# Patient Record
Sex: Male | Born: 1950 | Race: Black or African American | Hispanic: No | Marital: Married | Smoking: Current every day smoker
Health system: Southern US, Community
[De-identification: ages and names within clinical notes are randomized; demographics above are authoritative.]

## PROBLEM LIST (undated history)

## (undated) ENCOUNTER — Ambulatory Visit (HOSPITAL_COMMUNITY): Admission: EM | Payer: Medicare HMO

## (undated) ENCOUNTER — Emergency Department (HOSPITAL_COMMUNITY): Payer: Medicare HMO

## (undated) DIAGNOSIS — F419 Anxiety disorder, unspecified: Secondary | ICD-10-CM

## (undated) DIAGNOSIS — IMO0001 Reserved for inherently not codable concepts without codable children: Secondary | ICD-10-CM

## (undated) DIAGNOSIS — F32A Depression, unspecified: Secondary | ICD-10-CM

## (undated) DIAGNOSIS — K219 Gastro-esophageal reflux disease without esophagitis: Secondary | ICD-10-CM

## (undated) DIAGNOSIS — F1110 Opioid abuse, uncomplicated: Secondary | ICD-10-CM

## (undated) DIAGNOSIS — I878 Other specified disorders of veins: Secondary | ICD-10-CM

## (undated) DIAGNOSIS — B182 Chronic viral hepatitis C: Secondary | ICD-10-CM

## (undated) DIAGNOSIS — F1010 Alcohol abuse, uncomplicated: Secondary | ICD-10-CM

## (undated) DIAGNOSIS — I10 Essential (primary) hypertension: Secondary | ICD-10-CM

## (undated) DIAGNOSIS — N529 Male erectile dysfunction, unspecified: Secondary | ICD-10-CM

## (undated) DIAGNOSIS — F329 Major depressive disorder, single episode, unspecified: Secondary | ICD-10-CM

## (undated) HISTORY — PX: TONSILLECTOMY: SUR1361

## (undated) HISTORY — PX: CHOLECYSTECTOMY: SHX55

## (undated) HISTORY — PX: ORBITAL FRACTURE SURGERY: SHX725

## (undated) HISTORY — DX: Male erectile dysfunction, unspecified: N52.9

## (undated) HISTORY — DX: Depression, unspecified: F32.A

## (undated) HISTORY — DX: Major depressive disorder, single episode, unspecified: F32.9

## (undated) HISTORY — DX: Anxiety disorder, unspecified: F41.9

## (undated) HISTORY — DX: Essential (primary) hypertension: I10

---

## 1999-07-02 ENCOUNTER — Other Ambulatory Visit: Admission: RE | Admit: 1999-07-02 | Discharge: 1999-07-02 | Payer: Self-pay | Admitting: Urology

## 1999-08-05 ENCOUNTER — Ambulatory Visit (HOSPITAL_COMMUNITY): Admission: RE | Admit: 1999-08-05 | Discharge: 1999-08-05 | Payer: Self-pay | Admitting: Gastroenterology

## 1999-08-05 ENCOUNTER — Encounter (INDEPENDENT_AMBULATORY_CARE_PROVIDER_SITE_OTHER): Payer: Self-pay | Admitting: Specialist

## 2000-01-01 ENCOUNTER — Inpatient Hospital Stay (HOSPITAL_COMMUNITY): Admission: EM | Admit: 2000-01-01 | Discharge: 2000-01-07 | Payer: Self-pay | Admitting: Family Medicine

## 2000-01-02 ENCOUNTER — Encounter: Payer: Self-pay | Admitting: Family Medicine

## 2001-07-22 ENCOUNTER — Encounter: Payer: Self-pay | Admitting: Internal Medicine

## 2001-07-22 ENCOUNTER — Encounter: Admission: RE | Admit: 2001-07-22 | Discharge: 2001-07-22 | Payer: Self-pay | Admitting: Internal Medicine

## 2001-07-30 ENCOUNTER — Ambulatory Visit (HOSPITAL_COMMUNITY): Admission: RE | Admit: 2001-07-30 | Discharge: 2001-07-30 | Payer: Self-pay | Admitting: Gastroenterology

## 2002-04-02 ENCOUNTER — Ambulatory Visit (HOSPITAL_COMMUNITY): Admission: RE | Admit: 2002-04-02 | Discharge: 2002-04-02 | Payer: Self-pay | Admitting: Internal Medicine

## 2002-04-02 ENCOUNTER — Encounter: Payer: Self-pay | Admitting: Internal Medicine

## 2002-07-01 ENCOUNTER — Emergency Department (HOSPITAL_COMMUNITY): Admission: EM | Admit: 2002-07-01 | Discharge: 2002-07-01 | Payer: Self-pay | Admitting: Emergency Medicine

## 2002-07-01 ENCOUNTER — Encounter: Payer: Self-pay | Admitting: Emergency Medicine

## 2002-10-19 ENCOUNTER — Emergency Department (HOSPITAL_COMMUNITY): Admission: EM | Admit: 2002-10-19 | Discharge: 2002-10-19 | Payer: Self-pay | Admitting: Emergency Medicine

## 2002-10-20 ENCOUNTER — Encounter: Payer: Self-pay | Admitting: Emergency Medicine

## 2002-10-20 ENCOUNTER — Emergency Department (HOSPITAL_COMMUNITY): Admission: EM | Admit: 2002-10-20 | Discharge: 2002-10-20 | Payer: Self-pay | Admitting: Emergency Medicine

## 2002-11-24 ENCOUNTER — Emergency Department (HOSPITAL_COMMUNITY): Admission: EM | Admit: 2002-11-24 | Discharge: 2002-11-25 | Payer: Self-pay | Admitting: Emergency Medicine

## 2002-12-31 ENCOUNTER — Encounter: Payer: Self-pay | Admitting: Emergency Medicine

## 2002-12-31 ENCOUNTER — Inpatient Hospital Stay (HOSPITAL_COMMUNITY): Admission: EM | Admit: 2002-12-31 | Discharge: 2003-01-02 | Payer: Self-pay | Admitting: Emergency Medicine

## 2003-06-21 ENCOUNTER — Inpatient Hospital Stay (HOSPITAL_COMMUNITY): Admission: AD | Admit: 2003-06-21 | Discharge: 2003-06-23 | Payer: Self-pay | Admitting: *Deleted

## 2003-07-15 ENCOUNTER — Emergency Department (HOSPITAL_COMMUNITY): Admission: EM | Admit: 2003-07-15 | Discharge: 2003-07-16 | Payer: Self-pay | Admitting: Emergency Medicine

## 2004-07-21 ENCOUNTER — Ambulatory Visit: Payer: Self-pay | Admitting: Internal Medicine

## 2004-07-23 ENCOUNTER — Ambulatory Visit: Payer: Self-pay | Admitting: Internal Medicine

## 2004-07-30 ENCOUNTER — Ambulatory Visit: Payer: Self-pay | Admitting: Internal Medicine

## 2004-09-08 ENCOUNTER — Ambulatory Visit (HOSPITAL_COMMUNITY): Admission: RE | Admit: 2004-09-08 | Discharge: 2004-09-08 | Payer: Self-pay | Admitting: General Surgery

## 2004-10-29 ENCOUNTER — Encounter (INDEPENDENT_AMBULATORY_CARE_PROVIDER_SITE_OTHER): Payer: Self-pay | Admitting: Specialist

## 2004-10-29 ENCOUNTER — Observation Stay (HOSPITAL_COMMUNITY): Admission: RE | Admit: 2004-10-29 | Discharge: 2004-10-30 | Payer: Self-pay | Admitting: General Surgery

## 2004-12-09 ENCOUNTER — Emergency Department (HOSPITAL_COMMUNITY): Admission: EM | Admit: 2004-12-09 | Discharge: 2004-12-09 | Payer: Self-pay | Admitting: Family Medicine

## 2005-05-09 ENCOUNTER — Ambulatory Visit: Payer: Self-pay | Admitting: Internal Medicine

## 2005-12-16 ENCOUNTER — Ambulatory Visit: Payer: Self-pay | Admitting: Internal Medicine

## 2006-04-07 ENCOUNTER — Emergency Department (HOSPITAL_COMMUNITY): Admission: EM | Admit: 2006-04-07 | Discharge: 2006-04-08 | Payer: Self-pay | Admitting: Emergency Medicine

## 2006-04-11 ENCOUNTER — Ambulatory Visit: Payer: Self-pay | Admitting: Internal Medicine

## 2006-04-14 ENCOUNTER — Ambulatory Visit: Payer: Self-pay | Admitting: Internal Medicine

## 2006-05-26 ENCOUNTER — Ambulatory Visit: Payer: Self-pay | Admitting: Internal Medicine

## 2006-09-25 ENCOUNTER — Ambulatory Visit: Payer: Self-pay | Admitting: Internal Medicine

## 2006-11-10 ENCOUNTER — Ambulatory Visit: Payer: Self-pay | Admitting: Internal Medicine

## 2006-12-25 ENCOUNTER — Ambulatory Visit: Payer: Self-pay | Admitting: Internal Medicine

## 2007-02-21 ENCOUNTER — Encounter: Admission: RE | Admit: 2007-02-21 | Discharge: 2007-02-21 | Payer: Self-pay | Admitting: Family Medicine

## 2007-04-25 ENCOUNTER — Emergency Department (HOSPITAL_COMMUNITY): Admission: EM | Admit: 2007-04-25 | Discharge: 2007-04-25 | Payer: Self-pay | Admitting: Emergency Medicine

## 2007-05-03 ENCOUNTER — Telehealth (INDEPENDENT_AMBULATORY_CARE_PROVIDER_SITE_OTHER): Payer: Self-pay | Admitting: *Deleted

## 2007-05-15 ENCOUNTER — Telehealth: Payer: Self-pay | Admitting: Internal Medicine

## 2007-05-16 ENCOUNTER — Telehealth (INDEPENDENT_AMBULATORY_CARE_PROVIDER_SITE_OTHER): Payer: Self-pay | Admitting: *Deleted

## 2007-05-17 ENCOUNTER — Telehealth (INDEPENDENT_AMBULATORY_CARE_PROVIDER_SITE_OTHER): Payer: Self-pay | Admitting: *Deleted

## 2007-05-17 ENCOUNTER — Telehealth: Payer: Self-pay | Admitting: Internal Medicine

## 2007-05-23 ENCOUNTER — Ambulatory Visit: Payer: Self-pay | Admitting: Internal Medicine

## 2007-05-23 DIAGNOSIS — K219 Gastro-esophageal reflux disease without esophagitis: Secondary | ICD-10-CM

## 2007-05-23 DIAGNOSIS — F1120 Opioid dependence, uncomplicated: Secondary | ICD-10-CM | POA: Insufficient documentation

## 2007-05-23 DIAGNOSIS — A6000 Herpesviral infection of urogenital system, unspecified: Secondary | ICD-10-CM | POA: Insufficient documentation

## 2007-05-23 DIAGNOSIS — B182 Chronic viral hepatitis C: Secondary | ICD-10-CM | POA: Insufficient documentation

## 2007-05-23 DIAGNOSIS — M545 Low back pain: Secondary | ICD-10-CM

## 2007-05-23 DIAGNOSIS — F411 Generalized anxiety disorder: Secondary | ICD-10-CM | POA: Insufficient documentation

## 2007-05-23 LAB — CONVERTED CEMR LAB: Blood Glucose, Fingerstick: 83

## 2007-06-18 ENCOUNTER — Telehealth: Payer: Self-pay | Admitting: Internal Medicine

## 2007-06-22 ENCOUNTER — Encounter: Payer: Self-pay | Admitting: Internal Medicine

## 2007-07-11 ENCOUNTER — Encounter: Payer: Self-pay | Admitting: Internal Medicine

## 2007-10-11 ENCOUNTER — Encounter: Payer: Self-pay | Admitting: Internal Medicine

## 2007-10-15 ENCOUNTER — Telehealth: Payer: Self-pay | Admitting: Internal Medicine

## 2007-10-18 ENCOUNTER — Emergency Department (HOSPITAL_COMMUNITY): Admission: EM | Admit: 2007-10-18 | Discharge: 2007-10-18 | Payer: Self-pay | Admitting: Family Medicine

## 2007-11-02 ENCOUNTER — Ambulatory Visit: Payer: Self-pay | Admitting: Internal Medicine

## 2007-11-02 DIAGNOSIS — N529 Male erectile dysfunction, unspecified: Secondary | ICD-10-CM | POA: Insufficient documentation

## 2007-11-02 DIAGNOSIS — J309 Allergic rhinitis, unspecified: Secondary | ICD-10-CM | POA: Insufficient documentation

## 2007-11-13 ENCOUNTER — Encounter (INDEPENDENT_AMBULATORY_CARE_PROVIDER_SITE_OTHER): Payer: Self-pay | Admitting: Family Medicine

## 2007-11-13 ENCOUNTER — Ambulatory Visit: Payer: Self-pay | Admitting: Internal Medicine

## 2007-11-13 LAB — CONVERTED CEMR LAB
CO2: 23 meq/L (ref 19–32)
Calcium: 9.5 mg/dL (ref 8.4–10.5)
Chloride: 105 meq/L (ref 96–112)
Cholesterol: 114 mg/dL (ref 0–200)
Creatinine, Ser: 0.71 mg/dL (ref 0.40–1.50)
Eosinophils Relative: 1 % (ref 0–5)
Glucose, Bld: 89 mg/dL (ref 70–99)
HCT: 46.3 % (ref 39.0–52.0)
Hemoglobin: 15.7 g/dL (ref 13.0–17.0)
Lymphocytes Relative: 66 % — ABNORMAL HIGH (ref 12–46)
Lymphs Abs: 3.2 10*3/uL (ref 0.7–4.0)
Monocytes Absolute: 0.4 10*3/uL (ref 0.1–1.0)
Neutro Abs: 1.2 10*3/uL — ABNORMAL LOW (ref 1.7–7.7)
RBC: 5.58 M/uL (ref 4.22–5.81)
Total Bilirubin: 1.3 mg/dL — ABNORMAL HIGH (ref 0.3–1.2)
Total Protein: 7.6 g/dL (ref 6.0–8.3)
Triglycerides: 104 mg/dL (ref <150)
VLDL: 21 mg/dL (ref 0–40)
WBC: 4.9 10*3/uL (ref 4.0–10.5)

## 2007-12-17 ENCOUNTER — Ambulatory Visit: Payer: Self-pay | Admitting: *Deleted

## 2007-12-18 ENCOUNTER — Ambulatory Visit: Payer: Self-pay | Admitting: Internal Medicine

## 2007-12-19 ENCOUNTER — Ambulatory Visit: Payer: Self-pay | Admitting: Internal Medicine

## 2007-12-19 LAB — CONVERTED CEMR LAB
Cholesterol: 108 mg/dL (ref 0–200)
LDL Cholesterol: 55 mg/dL (ref 0–99)
Total CHOL/HDL Ratio: 2.9
Triglycerides: 81 mg/dL (ref <150)
VLDL: 16 mg/dL (ref 0–40)

## 2007-12-27 ENCOUNTER — Ambulatory Visit: Payer: Self-pay | Admitting: Internal Medicine

## 2008-04-08 ENCOUNTER — Emergency Department (HOSPITAL_COMMUNITY): Admission: EM | Admit: 2008-04-08 | Discharge: 2008-04-08 | Payer: Self-pay | Admitting: Emergency Medicine

## 2008-04-23 ENCOUNTER — Ambulatory Visit: Payer: Self-pay | Admitting: Internal Medicine

## 2008-06-23 ENCOUNTER — Emergency Department (HOSPITAL_COMMUNITY): Admission: EM | Admit: 2008-06-23 | Discharge: 2008-06-23 | Payer: Self-pay | Admitting: Family Medicine

## 2008-07-23 ENCOUNTER — Ambulatory Visit: Payer: Self-pay | Admitting: Internal Medicine

## 2008-09-11 ENCOUNTER — Ambulatory Visit: Payer: Self-pay | Admitting: Internal Medicine

## 2008-09-11 LAB — CONVERTED CEMR LAB
AST: 32 units/L (ref 0–37)
Albumin: 4.3 g/dL (ref 3.5–5.2)
BUN: 20 mg/dL (ref 6–23)
CO2: 25 meq/L (ref 19–32)
Calcium: 9.2 mg/dL (ref 8.4–10.5)
Chloride: 106 meq/L (ref 96–112)
Cholesterol: 111 mg/dL (ref 0–200)
Creatinine, Ser: 0.76 mg/dL (ref 0.40–1.50)
Glucose, Bld: 115 mg/dL — ABNORMAL HIGH (ref 70–99)
HDL: 36 mg/dL — ABNORMAL LOW (ref >39)
PSA: 0.72 ng/mL (ref 0.10–4.00)
Potassium: 4.3 meq/L (ref 3.5–5.3)
Total CHOL/HDL Ratio: 3.1
Triglycerides: 98 mg/dL (ref <150)

## 2008-09-16 ENCOUNTER — Ambulatory Visit: Payer: Self-pay | Admitting: Internal Medicine

## 2008-10-21 ENCOUNTER — Ambulatory Visit: Payer: Self-pay | Admitting: Internal Medicine

## 2008-12-07 ENCOUNTER — Emergency Department (HOSPITAL_COMMUNITY): Admission: EM | Admit: 2008-12-07 | Discharge: 2008-12-07 | Payer: Self-pay | Admitting: Family Medicine

## 2008-12-21 ENCOUNTER — Emergency Department (HOSPITAL_COMMUNITY): Admission: EM | Admit: 2008-12-21 | Discharge: 2008-12-21 | Payer: Self-pay | Admitting: Family Medicine

## 2009-01-19 ENCOUNTER — Emergency Department (HOSPITAL_COMMUNITY): Admission: EM | Admit: 2009-01-19 | Discharge: 2009-01-19 | Payer: Self-pay | Admitting: Emergency Medicine

## 2009-03-26 ENCOUNTER — Ambulatory Visit: Payer: Self-pay | Admitting: Internal Medicine

## 2009-04-03 ENCOUNTER — Ambulatory Visit: Payer: Self-pay | Admitting: Internal Medicine

## 2009-04-03 LAB — CONVERTED CEMR LAB
ALT: 65 units/L — ABNORMAL HIGH (ref 0–53)
BUN: 16 mg/dL (ref 6–23)
CO2: 24 meq/L (ref 19–32)
Calcium: 8.9 mg/dL (ref 8.4–10.5)
Chloride: 103 meq/L (ref 96–112)
Creatinine, Ser: 0.66 mg/dL (ref 0.40–1.50)
Glucose, Bld: 101 mg/dL — ABNORMAL HIGH (ref 70–99)
Total Bilirubin: 1.4 mg/dL — ABNORMAL HIGH (ref 0.3–1.2)

## 2009-04-16 ENCOUNTER — Ambulatory Visit: Payer: Self-pay | Admitting: Gastroenterology

## 2009-05-04 ENCOUNTER — Ambulatory Visit (HOSPITAL_COMMUNITY): Admission: RE | Admit: 2009-05-04 | Discharge: 2009-05-04 | Payer: Self-pay | Admitting: Gastroenterology

## 2009-05-22 ENCOUNTER — Ambulatory Visit (HOSPITAL_COMMUNITY): Admission: RE | Admit: 2009-05-22 | Discharge: 2009-05-22 | Payer: Self-pay | Admitting: Gastroenterology

## 2009-06-17 ENCOUNTER — Ambulatory Visit: Payer: Self-pay | Admitting: Internal Medicine

## 2009-06-18 ENCOUNTER — Ambulatory Visit: Payer: Self-pay | Admitting: Gastroenterology

## 2009-06-18 ENCOUNTER — Encounter: Payer: Self-pay | Admitting: Gastroenterology

## 2009-06-30 ENCOUNTER — Ambulatory Visit (HOSPITAL_COMMUNITY): Admission: RE | Admit: 2009-06-30 | Discharge: 2009-06-30 | Payer: Self-pay | Admitting: Gastroenterology

## 2009-07-23 ENCOUNTER — Ambulatory Visit: Payer: Self-pay | Admitting: Gastroenterology

## 2009-10-11 ENCOUNTER — Observation Stay (HOSPITAL_COMMUNITY): Admission: EM | Admit: 2009-10-11 | Discharge: 2009-10-11 | Payer: Self-pay | Admitting: Emergency Medicine

## 2009-10-13 ENCOUNTER — Ambulatory Visit: Payer: Self-pay | Admitting: Internal Medicine

## 2009-12-31 ENCOUNTER — Ambulatory Visit: Payer: Self-pay | Admitting: Gastroenterology

## 2010-04-30 ENCOUNTER — Emergency Department (HOSPITAL_COMMUNITY)
Admission: EM | Admit: 2010-04-30 | Discharge: 2010-04-30 | Payer: Self-pay | Source: Home / Self Care | Admitting: Family Medicine

## 2010-07-13 NOTE — Letter (Signed)
Summary: [facility]  [facility]   Imported By: Lester [place] 07/31/2009 08:42:34  _____________________________________________________________________  External Attachment:    Type:   Image     Comment:   External Document

## 2010-08-31 LAB — BASIC METABOLIC PANEL
BUN: 14 mg/dL (ref 6–23)
Calcium: 8.6 mg/dL (ref 8.4–10.5)
Chloride: 100 mEq/L (ref 96–112)
Creatinine, Ser: 0.6 mg/dL (ref 0.4–1.5)
GFR calc Af Amer: >60 mL/min (ref >60)

## 2010-08-31 LAB — DIFFERENTIAL
Basophils Absolute: 0 10*3/uL (ref 0.0–0.1)
Basophils Relative: 1 % (ref 0–1)
Eosinophils Absolute: 0.1 10*3/uL (ref 0.0–0.7)
Eosinophils Relative: 2 % (ref 0–5)
Monocytes Absolute: 0.6 10*3/uL (ref 0.1–1.0)

## 2010-08-31 LAB — POCT CARDIAC MARKERS
Myoglobin, poc: 63.2 ng/mL (ref 12–200)
Myoglobin, poc: 77.6 ng/mL (ref 12–200)
Troponin i, poc: <0.05 ng/mL (ref 0.00–0.09)
Troponin i, poc: <0.05 ng/mL (ref 0.00–0.09)

## 2010-08-31 LAB — CBC
HCT: 44.3 % (ref 39.0–52.0)
Hemoglobin: 15.2 g/dL (ref 13.0–17.0)
MCHC: 34.3 g/dL (ref 30.0–36.0)
RDW: 13.7 % (ref 11.5–15.5)

## 2010-09-14 LAB — CBC
MCV: 85.9 fL (ref 78.0–100.0)
Platelets: 160 10*3/uL (ref 150–400)
WBC: 5.1 10*3/uL (ref 4.0–10.5)

## 2010-09-14 LAB — PROTIME-INR: Prothrombin Time: 13.8 seconds (ref 11.6–15.2)

## 2010-09-14 LAB — APTT: aPTT: 30 seconds (ref 24–37)

## 2010-09-20 LAB — GLUCOSE, CAPILLARY: Glucose-Capillary: 100 mg/dL — ABNORMAL HIGH (ref 70–99)

## 2010-10-29 NOTE — H&P (Signed)
[facility]  Patient:    Bryan Powell, Bryan Powell                   MRN: [id number] Adm. Date:  [id number] Attending:  Drema Halon CC:         Ronnald Nian, M.D.                         History and Physical  HISTORY OF PRESENT ILLNESS:  This is a 60 year old black male with a 36 hour history of fever and chills with right leg swelling, for which he was seen one day prior by Dr. Sharlot Gowda who found redness in right lower leg/ankle area and placed him on cephalosporin antibiotic.  Over the next 24 hours he had fever and chills, decreased appetite and decreased liquids and was seen in the emergency room with his right lower leg swollen and red with a streak coming up to the right medial thigh with tenderness, though his fever had gone down when seen.  He was admitted for IV antibiotics.  Complicating factor was the fact that we were unable to get an IV or blood drawn because of past IV drug use as below.  A radiologist was called to put a PIC line in for blood drawing and IV fluids and antibiotic administration.  ALLERGIES:  No known drug allergies.  MEDICATIONS: 1. Prilosec 20 mg a day for esophagitis through Dr. Arlyce Dice. 2. 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  FAMILY HISTORY:  Father died at 21 of old age.  Mother is in a nursing facility with dementia.  He has a brother and a sister who are both alive and well.  SOCIAL HISTORY:  No tobacco or alcohol.  He works in Field seismologist.  The patient had a tetanus two years prior and reportedly a negative HIV two years prior.  He had IV drug use from the age of 54 to 60.  HOSPITALIZATIONS AND OPERATIONS: 1. Orbit fracture repair. 2. Probable hepatitis B at age 46.  REVIEW OF SYSTEMS:  Really nothing substantial except he is known to have hepatitis C proven by biopsy by Dr. Arlyce Dice two years ago with no therapy.  He has no other ongoing disease other than that listed above and a history of  hay fever.  PHYSICAL EXAMINATION ON ADMISSION:  Temperature 98.5, pulse 88, respirations 16, blood pressure 100/60 without drop.  SKIN:  Cool and dry with good turgor.  There is right ankle to inferior to the right knee redness and swelling of the calf which extends below prior mark lines, apparently by Dr. Susann Givens one day before.  In the medial calf there is an 8 inch red 1 cm wide strip, extending towards his groin with tenderness in the groin; although, I cannot palpate nodes.  HEENT:  Tongue and mouth are negative.  Nose is clear.  CARDIORESPIRATORY:  No murmurs are heard.  Lungs are clear.  ABDOMEN:  Soft without organomegaly or masses.  Bowel sounds were normal. There were no bruits heard.  GU:  Normal male, descended testes.  NEUROMUSCULOSKELETAL:  Painful movement of the leg without focal neurologic abnormality.  IMPRESSION ON ADMISSION: 1. Cellulitis right lower leg, extending into the right thigh, doubt sepsis. 2. 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  PLAN:  See orders.  The radiologist placed the line, has drawn the blood and we have taken cafe of the orders. DD:  01/02/00 TD:  01/03/00 Job: [id number]

## 2010-10-29 NOTE — Discharge Summary (Signed)
[facility]  Patient:    Bryan Powell, Bryan Powell                   MRN: [id number] Adm. Date:  [id number] Disc. Date: [id number] Attending:  Drema Halon                           Discharge Summary  ADMITTING DIAGNOSES:  Cellulitis of the leg with a previous history of hepatitis C and hepatitis B, also previous history of esophagitis.  HISTORY:  This is a 60 year old black male who was admitted to the hospital after a 36-hour history of fever, chills, and right leg swelling.  He was seen in my office the day prior to admission and started on antibiotics for presumed cellulitis; however, he got much worse over the ensuing 24 hours and came to the emergency room for further evaluation.  Because the leg swelling was much worse, it was decided to admit him for IV antibiotics.  PAST MEDICAL HISTORY:  Previous history significant as above and also he has a history of IV drug abuse which made it difficult to get IV access.  PHYSICAL EXAMINATION:  On admission.  GENERAL:  Black male in no acute distress.  VITAL SIGNS:  Temperature was 98.5, pulse 88, respirations 16.  SKIN:  Cool and dry.  EXTREMITIES:  The right lower extremity did show redness and erythema, especially in the calf area with some tenderness to palpation.  Otherwise his exam was essentially unremarkable.  HOSPITAL COURSE:  He was admitted through the emergency room.  IV access was difficult, and therefore a PIC line had to be placed.  Dr. Lebron Conners was consulted for surgical evaluation.  He was initially started on IV Rocephin and appropriate lab results were obtained including blood cultures, CBC, and a CMET.  He also was studied with a venous evaluation for DVT and SVT, and all were negative.  He responded slowly to the IV antibiotics and was eventually switched to Ceftin.  There were some abnormal liver enzymes and with his previous history of hepatitis B and C, this will be  followed up as an outpatient.  LABORATORY DATA:  Initial white blood count was 12.4 with repeat one day later of 7.9.  His glucose on admission was 134, albumin 3.1, ALT 42, ALP 32, total bilirubin 1.6.  HIV testing was negative.  Blood cultures showed no growth after approximately five days.  Venous evaluation, as reported earlier, was negative.  CONDITION ON DISCHARGE:  Improved.  DISCHARGE MEDICATIONS:  Previous proton pump inhibitor as well as Ceftin.  FOLLOW-UP:  He is to be followed up in my office in approximately one week. DD:  03/06/00 TD:  03/06/00 Job: [id number]

## 2010-10-29 NOTE — H&P (Signed)
NAME:  Bryan Powell, LAX                   ACCOUNT NO.:  [id number]   MEDICAL RECORD NO.:  [id number]                   PATIENT TYPE:  INP   LOCATION:  0101                                 FACILITY:  [facility]   PHYSICIAN:  Mark C. Ophelia Charter, M.D.                 DATE OF BIRTH:  April 26, 1951   DATE OF ADMISSION:  12/31/2002  DATE OF DISCHARGE:                                HISTORY & PHYSICAL   DIAGNOSES:  Intravenous heroin user with fall down stairs and left proximal  humerus fracture dislocation.   HISTORY:  A 60 year old male 60-month history of heroin use after going 12  years with abstinence fell down the stairs.  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   The patient has recently been seen by Georgina Quint. Plotnikov, M.D. [facility].  Has  been on Keflex for cellulitis secondary to heroin IV multiple injections.  He has had cellulitis in his legs as well as his left forearm recently.   ALLERGIES:  NKDA.   PAST MEDICAL HISTORY:  Hepatitis C.   SOCIAL HISTORY:  He works as a Medical illustrator.  Lives with his wife.   MEDICATIONS:  He is on some medications for sinuses.  Also takes a fluid  pill and some Prilosec.  All dosages are unknown and wife will obtain  appropriate dosages of his medications.   PHYSICAL EXAMINATION:  VITAL SIGNS:  Temperature 98, pulse 102, respirations  20, blood pressure 127/70.  GENERAL:  The patient is somnolent due to recent IV heroin  injection/benzodiazepines, etc.  NECK:  Supple.  LUNGS:  He has decreased breath sounds at the bases.  EXTREMITIES:  Examination of arms demonstrates diffuse edema, subcutaneous  fullness without firm compartments both upper and lower extremities.  He has  venous stasis changes, multiple injection sites in his legs and arms with  needle track.  ABDOMEN:  Soft, nontender.  SKIN:  No neck injection  sites are visualized.  NEUROLOGIC:  He has some decreased sensation over the deltoid skin region of  axillary nerve innervation.  Grip strength, radial nerve function is normal.   LABORATORIES:  Chest x-ray shows early atelectasis versus infiltrate, worse  on left than right.  Left shoulder shows fracture of the humeral neck just  above apparent benign lesion of the metaphysis diaphysis with an anterior  shoulder dislocation.   PLAN:  Admit.  To OR for reduction, pinning versus external fixation versus  open reduction as needed.  Procedure is discussed with patient who is fairly  awake.  Wife was present and she demonstrated good understanding of the  problem.  The patient had previous history of heroin use but was in prison  and stopped IV narcotic usage and per wife's history he was clean for 12  years until he restarted seven months ago.  Medical consultation will be  obtained  with Georgina Quint. Plotnikov, M.D. [facility] group.  He will be at increased risk for  repeat dislocation and penetration of the fracture through the skin if he  develops narcotic withdrawal seizures.  After two to three weeks of healing  he should be able to be weaned off his narcotics and placed in a drug  treatment program.                                               Loraine Leriche C. Ophelia Charter, M.D.    MCY/MEDQ  D:  12/31/2002  T:  12/31/2002  Job:  [id number]   cc:   Georgina Quint. Plotnikov, M.D. [facility]    cc:   Georgina Quint. Plotnikov, M.D. [facility]

## 2010-10-29 NOTE — Op Note (Signed)
NAME:  Bryan Powell, SCHAUF NO.:  [id number]   MEDICAL RECORD NO.:  [id number]          PATIENT TYPE:  AMB   LOCATION:  DAY                          FACILITY:  [facility]   PHYSICIAN:  Leonie Man, M.D.   DATE OF BIRTH:  June 03, 1951   DATE OF PROCEDURE:  10/29/2004  DATE OF DISCHARGE:                                 OPERATIVE REPORT   PREOPERATIVE DIAGNOSIS:  Calculous cholecystitis.   POSTOPERATIVE DIAGNOSIS:  Calculous cholecystitis.   PROCEDURES:  Laparoscopic cholecystectomy with intraoperative cholangiogram.   SURGEON:  Leonie Man, MD   ASSISTANT:  Lebron Conners, MD.   ANESTHESIA:  General.   SPECIMENS:  Gallbladder.   ESTIMATED BLOOD LOSS:  Minimal.   COMPLICATIONS:  None.   The patient to the PACU in good condition.   HISTORY:  The patient is a 60 year old man with symptomatic calculous  cholelithiasis confirmed on ultrasound who comes to the operating room after  the risks and potential benefits of surgery had been fully discussed, all  questions answered, and consent obtained. The patient has a prior history of  heroin drug abuse and is currently on a methadone program. His drug screen  prior to admission to the hospital is negative. Because of his lack of  venous access, I inserted a central line catheter via the a right internal  jugular prior to the procedure.   DESCRIPTION OF PROCEDURE:  Following the induction of satisfactory general  anesthesia, the patient was positioned supinely. The abdomen is prepped and  draped routinely. Open laparoscopy is created at the umbilicus. I inserted a  Hassan cannula, insufflated the peritoneal cavity to 14 mmHg pressure. A  camera was inserted and visual exploration carried out. The gallbladder was  noted to be thin walled. There were a few adhesions around the ampulla of  the gallbladder to the duodenum.  Liver edges were sharp. Liver surfaces  smooth,  all of the small or large intestine viewed  appeared to be normal.   Under direct vision, epigastric and lateral ports were placed. The  gallbladder was then grasped and retracted cephalad. Dissection carried down  in the region of the ampulla with isolation of the cystic artery and cystic  duct. The cystic artery being traced to its entry into the gallbladder wall,  the cystic duct traced to the gallbladder cystic duct junction. The common  duct could be clearly seen. The cystic duct was clipped proximally. The  cystic artery was triply clipped. The cystic duct was opened and a Cook  catheter was inserted through a small stab wound in the upper abdomen into  the abdomen and into the cystic duct. The resulting cholangiogram following  injection of 60% Hypaque diluted by one-half into the extrahepatic biliary  system showed a prompt flow of contrast into the duodenum, normal tapering  of the distal common duct and the upper hepatic radicles appeared promptly  with no evidence of filling defects. The catheter was removed and the cystic  duct was triply clipped and transected. The cystic artery was also  transected and the gallbladder was then dissected free from the liver bed  using electrocautery and maintaining hemostasis throughout the entire course  of the dissection.  At the end of this dissection, the right upper quadrant  was thoroughly irrigated, additional bleeding points treated with  electrocautery. The camera was then moved to the epigastric port and the  gallbladder was retrieved through the umbilical port without difficulty.  Final inspection of the liver bed and the upper quadrant detected no  bleeding. Sponge, instrument and sharp counts were verified and trocars  removed under direct vision. The pneumoperitoneum was released.  The patient  had an incarcerated umbilical hernia through which a Hassan cannula was  placed. This area was prepared and debrided of the segments of fat  incarcerated within the hernia and the  umbilical hernia was repaired with  interrupted #0 Novofil sutures. The skin was closed with 4-0 Monocryl.  Epigastric and umbilical wounds with running sutures of 4-0 Monocryl. All  wounds were reinforced with Steri-Strips, sterile dressings applied, the  anesthetic reversed. The patient removed from the operating room to the  recovery room in stable condition. He tolerated the procedure well.      PB/MEDQ  D:  10/29/2004  T:  10/29/2004  Job:  [id number]

## 2010-10-29 NOTE — Op Note (Signed)
   NAME:  Bryan Powell, Bryan Powell                   ACCOUNT NO.:  [id number]   MEDICAL RECORD NO.:  [id number]                   PATIENT TYPE:  INP   LOCATION:  0101                                 FACILITY:  [facility]   PHYSICIAN:  Mark C. Ophelia Charter, M.D.                 DATE OF BIRTH:  1950/10/08   DATE OF PROCEDURE:  12/31/2002  DATE OF DISCHARGE:                                 OPERATIVE REPORT   PREOPERATIVE DIAGNOSES:  1. Intravenous narcotic abuse with fall.  2. Left proximal humerus fracture with anterior dislocation.   POSTOPERATIVE DIAGNOSES:  1. Intravenous narcotic abuse with fall.  2. Left proximal humerus fracture with anterior dislocation.   PROCEDURES:  1. Closed reduction of anterior shoulder dislocation.  2. Closed reduction of proximal humerus fracture.   SURGEON:  Mark C. Ophelia Charter, M.D.   ANESTHESIA:  GOT.   PROCEDURE:  After the induction of general anesthesia with complete  paralysis, gentle distraction was applied.  The position of the patient was  supine on a Schlein table and with stocking foot covered with shoe  protector, this was placed in his left axilla.  Gentle traction was  performed and with hand manipulation, the fracture was reduced, gently  sliding it into place, reducing the shoulder dislocation.  Fluoroscopy was  used to check the position of the fracture.  There was good position, and  the arm was placed across the chest and in a shoulder immobilizer.                                                 Mark C. Ophelia Charter, M.D.    MCY/MEDQ  D:  12/31/2002  T:  12/31/2002  Job:  [id number]

## 2010-10-29 NOTE — H&P (Signed)
[facility]  Patient:    Bryan Powell, Bryan Powell                   MRN: [id number] Adm. Date:  [id number] Attending:  Drema Halon                         History and Physical  No dictation DD:  01/01/00 TD:  01/03/00 Job: [id number]

## 2010-10-29 NOTE — Consult Note (Signed)
NAME:  Bryan Powell, Bryan Powell                   ACCOUNT NO.:  [id number]   MEDICAL RECORD NO.:  [id number]                   PATIENT TYPE:  INP   LOCATION:  0101                                 FACILITY:  [facility]   PHYSICIAN:  Titus Dubin. Alwyn Ren, M.D. [facility]         DATE OF BIRTH:  [birth year]   DATE OF CONSULTATION:  12/31/2002  DATE OF DISCHARGE:                                   CONSULTATION   HISTORY OF PRESENT ILLNESS:  The patient is an unfortunate 60 year old white  male admitted with a fracture dislocation of the left shoulder sustained  approximately 24 hours ago.  This is in the context of self admitted drug  abuse and a positive drug screen for opiates, cocaine, and benzodiazepines.  He also admits to a history of hepatitis C.  He has been treated for  cellulitis in the past related to heroin use.   PAST MEDICAL HISTORY:  1. Tonsillectomy and adenoidectomy.  2. Esophageal reflux for which he takes Prilosec.   ALLERGIES:  He has no known drug allergies.   SOCIAL HISTORY:  He states he drinks minimally, approximately once a week,  and does not smoke.   FAMILY HISTORY:  Positive for stroke in his mother and diabetes in his  father, diet controlled.   REVIEW OF SYSTEMS:  He denies any other symptoms other than the arm pain.  Specifically, he denies chest pain, palpitations, or shortness of breath.  He has had no abdominal pain or genitourinary symptoms.   PHYSICAL EXAMINATION:  GENERAL:  Initially, history could not be obtained  because he was profoundly lethargic.  Opening his eyes with disconjugate  gaze and garbled, unintelligible speech.  Inadvertently, the IV technician  moved his left arm resulting in pain.  After this he was more alert, but  still exhibited disconjugate gaze and garbled speech and repetitive speech  pattern.  VITAL SIGNS:  Temperature 98, blood pressure 127/78, pulse 102, O2  saturations 96%.  HEENT:  Arterial narrowing is noted.  He has pattern  alopecia.  There is no  lymphadenopathy of the head, neck, axilla.  Other canals are clear.  There  is some septal dislocation of the nasal septum.  There were no conjunctival  hemorrhages.  HEART:  Has a grade 1 systolic murmur.  ABDOMEN:  An aortic bruit is noted, but no aneurysm palpable.  There is  dullness to percussion in the right upper quadrant without definite  organomegaly.  EXTREMITIES:  The most striking findings except for his mental status  changes are the edema of all four extremities.  There are multiple scars  over the forearms.  There is sclerotic hyperpigment changes of the shins.  Decreased posterior tibial pulses are noted.  NEUROLOGIC/PSYCHIATRIC:  Limited by the deficits noted above.   LABORATORIES:  EKG revealed a small T-wave in 1 and aVL which did not appear  pathologic.  He has nonspecific T changes in 2, 3, and V3.  White count was  7100 and hematocrit 40.9.  He additionally noted that the tongue was  somewhat beefy red and suggested some clinical dehydration.  His potassium  was 3.3, BUN 7 with creatinine 0.7.  The remaining CBC and differential and  CMET were normal.  Urine revealed rare bacteria.  Specific gravity was  1.036, total protein 30 mg/dl, urobilinogen 2 mg/dl.  Leukocyte esterase was  negative as there were no nitrates.  As noted, the drug screen was positive.   ASSESSMENT/PLAN:  He will be monitored perioperatively.  Telemetry is  essential because of the risks related to the cocaine.  Hypertension,  tachypnea, tachycardia are possible as well as depression of his CNS which  also can occur with heroin.  A direct cardiac event including cardiac spasm  with lethal arrhythmias.  Pulmonary edema may develop too if he been using  the pre-alkaline-form cocaine (freebase) or the heated bicarbonate  precipitant (crack).  As noted, he states he does not smoke but additional  history is not obtained as well as cocaine __________ .   Beta-adrenergic  antagonists should be avoided if he develops ischemic  changes.  Labetalol can be employed for hypotension.  Lidocaine should be  used for ventricular dysrhythmia.  Benzodiazepines can be initiated to  decrease the stimulatory effects of cocaine and treating seizures.  These  should be followed with phenytoin or phenobarbital for longer term seizure  control.   Consultation will be obtained with Antonietta Breach, M.D., psychiatry for  additional interaction, possible long-term care, although it is unlikely  that the patient will desire this based on this initial evaluation.                                               Titus Dubin. Alwyn Ren, M.D. [facility]    WFH/MEDQ  D:  12/31/2002  T:  12/31/2002  Job:  [id number]   cc:   Georgina Quint. Plotnikov, M.D. [facility]
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
## 2011-07-01 ENCOUNTER — Encounter: Payer: Self-pay | Admitting: Gastroenterology

## 2011-08-01 ENCOUNTER — Other Ambulatory Visit: Payer: Self-pay | Admitting: Internal Medicine

## 2011-08-01 DIAGNOSIS — R609 Edema, unspecified: Secondary | ICD-10-CM

## 2011-08-01 DIAGNOSIS — R52 Pain, unspecified: Secondary | ICD-10-CM

## 2011-08-02 ENCOUNTER — Ambulatory Visit
Admission: RE | Admit: 2011-08-02 | Discharge: 2011-08-02 | Disposition: A | Discharge status: Home / Self Care | Payer: BC Managed Care – PPO | Source: Ambulatory Visit | Attending: Internal Medicine | Admitting: Internal Medicine

## 2011-08-02 DIAGNOSIS — R52 Pain, unspecified: Secondary | ICD-10-CM

## 2011-08-02 DIAGNOSIS — R609 Edema, unspecified: Secondary | ICD-10-CM

## 2011-08-05 ENCOUNTER — Encounter (HOSPITAL_COMMUNITY): Payer: Self-pay | Admitting: *Deleted

## 2011-08-05 ENCOUNTER — Emergency Department (INDEPENDENT_AMBULATORY_CARE_PROVIDER_SITE_OTHER)
Admission: EM | Admit: 2011-08-05 | Discharge: 2011-08-05 | Disposition: A | Discharge status: Home Health | Payer: BC Managed Care – PPO | Source: Home / Self Care | Attending: Emergency Medicine | Admitting: Emergency Medicine

## 2011-08-05 DIAGNOSIS — L02419 Cutaneous abscess of limb, unspecified: Secondary | ICD-10-CM

## 2011-08-05 DIAGNOSIS — L03119 Cellulitis of unspecified part of limb: Secondary | ICD-10-CM

## 2011-08-05 DIAGNOSIS — L03115 Cellulitis of right lower limb: Secondary | ICD-10-CM

## 2011-08-05 HISTORY — DX: Gastro-esophageal reflux disease without esophagitis: K21.9

## 2011-08-05 HISTORY — DX: Reserved for inherently not codable concepts without codable children: IMO0001

## 2011-08-05 MED ORDER — MINOCYCLINE HCL 100 MG PO CAPS: 100.0000 mg | ORAL_CAPSULE | Freq: Two times a day (BID) | ORAL | Status: DC

## 2011-08-05 MED ORDER — OXYCODONE-ACETAMINOPHEN 5-325 MG PO TABS: 1.0000 | ORAL_TABLET | ORAL | Status: AC | PRN

## 2011-08-05 MED ORDER — CEFTRIAXONE SODIUM 1 G IJ SOLR
INTRAMUSCULAR | Status: AC
  Filled 2011-08-05: qty 10

## 2011-08-05 MED ORDER — CEFTRIAXONE SODIUM 1 G IJ SOLR
1.0000 g | Freq: Once | INTRAMUSCULAR | Status: AC
  Administered 2011-08-05: 1 g via INTRAMUSCULAR

## 2011-08-05 NOTE — ED Notes (Signed)
Pt is here with complaints of right lower leg redness, pain and swelling.  Pt has history of cellulitis.

## 2011-08-10 ENCOUNTER — Other Ambulatory Visit: Payer: Self-pay | Admitting: Internal Medicine

## 2011-08-10 DIAGNOSIS — B1920 Unspecified viral hepatitis C without hepatic coma: Secondary | ICD-10-CM

## 2011-08-11 ENCOUNTER — Ambulatory Visit
Admission: RE | Admit: 2011-08-11 | Discharge: 2011-08-11 | Disposition: A | Discharge status: Home / Self Care | Payer: BC Managed Care – PPO | Source: Ambulatory Visit | Attending: Internal Medicine | Admitting: Internal Medicine

## 2011-08-11 DIAGNOSIS — B1920 Unspecified viral hepatitis C without hepatic coma: Secondary | ICD-10-CM

## 2011-08-31 NOTE — ED Provider Notes (Signed)
Medical screening examination/treatment/procedure(s) were performed by non-physician practitioner and as supervising physician I was immediately available for consultation/collaboration.  Raynald Blend, MD 08/31/11 [phone]

## 2011-08-31 NOTE — ED Provider Notes (Signed)
Bryan Powell is a 61 y.o. male who presents to Urgent Care today for swelling and redness of the right lower leg for the last few days. He has bilateral edema off and on for years. Recently both legs has been swollen however the right leg became tender and red on the anterior aspect. He denies any new wounds. He denies any fever, chills, trouble breathing.  He declines transfer to the ED for further evaluation and management.    PMH reviewed. Significant for BL edema.  ROS as above otherwise neg Medications reviewed. No current facility-administered medications for this encounter.   Current Outpatient Prescriptions  Medication Sig Dispense Refill  . esomeprazole (NEXIUM) 40 MG capsule Take 40 mg by mouth daily before breakfast.      . minocycline (MINOCIN) 100 MG capsule Take 1 capsule (100 mg total) by mouth 2 (two) times daily.  20 capsule  0    Exam:  BP 132/62  Pulse 74  Temp 97.2 F (36.2 C)  Resp 18  SpO2 98% Gen: Well NAD HEENT: EOMI,  MMM Lungs: CTABL Nl WOB Heart: RRR no MRG Abd: NABS, NT, ND Exts: 3+ Edema BL  LE, warm and well perfused.  Skin: Area on the anterior aspect erythematous and tender.  Cellulitic in nature.   Assessment and Plan: 61 yo male with extremity cellulitis. He is in poor overall health and I would like to transfer to the ED for further evaluation and management. However he declines transfer. Plan to treat with IM ceftriaxone and a RX for minocycline and small amount of pain medications. I am somewhat concerned for abscess or DVT. This is also why I would like the pt to go to the ED. However he wishes to go home. As I feel the risk for DVT is low I feel this is a reasonable approach. Will ask pt to return to clinic tomorrow for a recheck and re-evaluation. Pt expresses understanding.      Rodolph Bong, MD 08/31/11 1000

## 2011-10-12 ENCOUNTER — Inpatient Hospital Stay (HOSPITAL_COMMUNITY)
Admission: AD | Admit: 2011-10-12 | Discharge: 2011-10-14 | DRG: 277 | Disposition: A | Discharge status: Home / Self Care | Payer: BC Managed Care – PPO | Source: Ambulatory Visit | Attending: Internal Medicine | Admitting: Internal Medicine

## 2011-10-12 ENCOUNTER — Encounter (HOSPITAL_COMMUNITY): Payer: Self-pay | Admitting: Internal Medicine

## 2011-10-12 DIAGNOSIS — R609 Edema, unspecified: Secondary | ICD-10-CM | POA: Diagnosis present

## 2011-10-12 DIAGNOSIS — K219 Gastro-esophageal reflux disease without esophagitis: Secondary | ICD-10-CM | POA: Diagnosis present

## 2011-10-12 DIAGNOSIS — F1411 Cocaine abuse, in remission: Secondary | ICD-10-CM | POA: Diagnosis present

## 2011-10-12 DIAGNOSIS — L03119 Cellulitis of unspecified part of limb: Secondary | ICD-10-CM | POA: Diagnosis present

## 2011-10-12 DIAGNOSIS — F1211 Cannabis abuse, in remission: Secondary | ICD-10-CM | POA: Diagnosis present

## 2011-10-12 DIAGNOSIS — B1920 Unspecified viral hepatitis C without hepatic coma: Secondary | ICD-10-CM | POA: Insufficient documentation

## 2011-10-12 DIAGNOSIS — I872 Venous insufficiency (chronic) (peripheral): Secondary | ICD-10-CM | POA: Diagnosis present

## 2011-10-12 DIAGNOSIS — B182 Chronic viral hepatitis C: Secondary | ICD-10-CM

## 2011-10-12 DIAGNOSIS — A6000 Herpesviral infection of urogenital system, unspecified: Secondary | ICD-10-CM | POA: Diagnosis present

## 2011-10-12 DIAGNOSIS — L02419 Cutaneous abscess of limb, unspecified: Principal | ICD-10-CM | POA: Diagnosis present

## 2011-10-12 DIAGNOSIS — L02619 Cutaneous abscess of unspecified foot: Secondary | ICD-10-CM

## 2011-10-12 DIAGNOSIS — I878 Other specified disorders of veins: Secondary | ICD-10-CM | POA: Diagnosis present

## 2011-10-12 DIAGNOSIS — F1111 Opioid abuse, in remission: Secondary | ICD-10-CM | POA: Diagnosis present

## 2011-10-12 DIAGNOSIS — L03039 Cellulitis of unspecified toe: Secondary | ICD-10-CM

## 2011-10-12 HISTORY — DX: Chronic viral hepatitis C: B18.2

## 2011-10-12 HISTORY — DX: Gastro-esophageal reflux disease without esophagitis: K21.9

## 2011-10-12 HISTORY — DX: Other specified disorders of veins: I87.8

## 2011-10-12 LAB — DIFFERENTIAL
Basophils Absolute: 0 10*3/uL (ref 0.0–0.1)
Eosinophils Relative: 4 % (ref 0–5)
Lymphocytes Relative: 54 % — ABNORMAL HIGH (ref 12–46)
Monocytes Absolute: 0.6 10*3/uL (ref 0.1–1.0)

## 2011-10-12 LAB — COMPREHENSIVE METABOLIC PANEL
ALT: 57 U/L — ABNORMAL HIGH (ref 0–53)
Alkaline Phosphatase: 76 U/L (ref 39–117)
CO2: 25 mEq/L (ref 19–32)
Chloride: 99 mEq/L (ref 96–112)
GFR calc Af Amer: >90 mL/min (ref >90)
GFR calc non Af Amer: >90 mL/min (ref >90)
Glucose, Bld: 101 mg/dL — ABNORMAL HIGH (ref 70–99)
Potassium: 4.3 mEq/L (ref 3.5–5.1)
Sodium: 135 mEq/L (ref 135–145)
Total Bilirubin: 0.4 mg/dL (ref 0.3–1.2)

## 2011-10-12 LAB — CBC
Hemoglobin: 13.8 g/dL (ref 13.0–17.0)
MCHC: 34.6 g/dL (ref 30.0–36.0)
RBC: 4.93 MIL/uL (ref 4.22–5.81)

## 2011-10-12 MED ORDER — LORAZEPAM 0.5 MG PO TABS
0.5000 mg | ORAL_TABLET | Freq: Two times a day (BID) | ORAL | Status: DC | PRN
  Administered 2011-10-14: 0.5 mg via ORAL
  Filled 2011-10-12: qty 1

## 2011-10-12 MED ORDER — ONDANSETRON HCL 4 MG/2ML IJ SOLN: 4.0000 mg | Freq: Four times a day (QID) | INTRAMUSCULAR | Status: DC | PRN

## 2011-10-12 MED ORDER — BISACODYL 10 MG RE SUPP: 10.0000 mg | Freq: Every day | RECTAL | Status: DC | PRN

## 2011-10-12 MED ORDER — ENOXAPARIN SODIUM 30 MG/0.3ML ~~LOC~~ SOLN
30.0000 mg | SUBCUTANEOUS | Status: DC
  Filled 2011-10-12: qty 0.3

## 2011-10-12 MED ORDER — OXYCODONE HCL 5 MG PO TABS: 5.0000 mg | ORAL_TABLET | ORAL | Status: DC | PRN

## 2011-10-12 MED ORDER — OXYCODONE HCL 5 MG PO TABS
10.0000 mg | ORAL_TABLET | ORAL | Status: DC | PRN
  Administered 2011-10-12 – 2011-10-14 (×11): 10 mg via ORAL
  Filled 2011-10-12 (×11): qty 2

## 2011-10-12 MED ORDER — VALACYCLOVIR HCL 500 MG PO TABS
500.0000 mg | ORAL_TABLET | Freq: Two times a day (BID) | ORAL | Status: DC
  Administered 2011-10-12 – 2011-10-14 (×4): 500 mg via ORAL
  Filled 2011-10-12 (×5): qty 1

## 2011-10-12 MED ORDER — MAGNESIUM CITRATE PO SOLN
1.0000 | Freq: Once | ORAL | Status: AC | PRN
  Filled 2011-10-12: qty 296

## 2011-10-12 MED ORDER — PANTOPRAZOLE SODIUM 40 MG PO TBEC
40.0000 mg | DELAYED_RELEASE_TABLET | Freq: Every day | ORAL | Status: DC
  Administered 2011-10-12 – 2011-10-14 (×3): 40 mg via ORAL
  Filled 2011-10-12 (×3): qty 1

## 2011-10-12 MED ORDER — ENOXAPARIN SODIUM 40 MG/0.4ML ~~LOC~~ SOLN
40.0000 mg | SUBCUTANEOUS | Status: DC
  Administered 2011-10-12 – 2011-10-14 (×3): 40 mg via SUBCUTANEOUS
  Filled 2011-10-12 (×3): qty 0.4

## 2011-10-12 MED ORDER — ONDANSETRON HCL 4 MG PO TABS: 4.0000 mg | ORAL_TABLET | Freq: Four times a day (QID) | ORAL | Status: DC | PRN

## 2011-10-12 MED ORDER — CEFAZOLIN SODIUM 1-5 GM-% IV SOLN
1.0000 g | Freq: Three times a day (TID) | INTRAVENOUS | Status: DC
  Administered 2011-10-12 – 2011-10-14 (×6): 1 g via INTRAVENOUS
  Filled 2011-10-12 (×9): qty 50

## 2011-10-12 MED ORDER — TRAMADOL HCL 50 MG PO TABS: 50.0000 mg | ORAL_TABLET | Freq: Four times a day (QID) | ORAL | Status: DC | PRN

## 2011-10-12 MED ORDER — VANCOMYCIN HCL 1000 MG IV SOLR
1500.0000 mg | Freq: Two times a day (BID) | INTRAVENOUS | Status: DC
  Administered 2011-10-12 – 2011-10-14 (×4): 1500 mg via INTRAVENOUS
  Filled 2011-10-12 (×6): qty 1500

## 2011-10-12 MED ORDER — CYCLOBENZAPRINE HCL 5 MG PO TABS
5.0000 mg | ORAL_TABLET | Freq: Every day | ORAL | Status: DC
  Administered 2011-10-12 – 2011-10-13 (×2): 5 mg via ORAL
  Filled 2011-10-12 (×3): qty 1

## 2011-10-12 MED ORDER — SENNA 8.6 MG PO TABS
1.0000 | ORAL_TABLET | Freq: Two times a day (BID) | ORAL | Status: DC
  Administered 2011-10-12 – 2011-10-14 (×4): 8.6 mg via ORAL
  Filled 2011-10-12 (×5): qty 1

## 2011-10-12 NOTE — Progress Notes (Signed)
ANTIBIOTIC CONSULT NOTE - INITIAL  Pharmacy Consult for Vancomycin/Cefazolin Indication: suspected MRSA LE cellulitis  No Known Allergies  Patient Measurements: Height: 5\' 11"  (180.3 cm) Weight: 186 lb (84.369 kg) IBW/kg (Calculated) : 75.3   Vital Signs: Temp: 98.6 F (37 C) (05/01 1500) Temp src: Oral (05/01 1500) BP: 140/87 mmHg (05/01 1500) Pulse Rate: 90  (05/01 1500) Intake/Output from previous day:   Intake/Output from this shift:    Labs: No results found for this basename: WBC:3,HGB:3,PLT:3,LABCREA:3,CREATININE:3 in the last 72 hours Estimated Creatinine Clearance: 103.3 ml/min (by C-G formula based on Cr of 0.6). No results found for this basename: VANCOTROUGH:2,VANCOPEAK:2,VANCORANDOM:2,GENTTROUGH:2,GENTPEAK:2,GENTRANDOM:2,TOBRATROUGH:2,TOBRAPEAK:2,TOBRARND:2,AMIKACINPEAK:2,AMIKACINTROU:2,AMIKACIN:2, in the last 72 hours   Microbiology: No results found for this or any previous visit (from the past 720 hour(s)).  Medical History: Past Medical History  Diagnosis Date  . Reflux   . Lower extremity venous stasis   . Genital herpes   . GERD (gastroesophageal reflux disease)   . Hep C w/o coma, chronic     Assessment: 62 YOM admitted for persistent/recurrent LE cellulitis since February, and has been on multiple antibiotics since then, including Bactrim 7 days before being admitted. Suspected for MRSA cellulitis, pharmacy is consulted to start empiric treatment with Vancomycin and Cefazolin. Pt. Is afebrile, wbc 4.3, scr 0.6, est. crcl > 100  Goal of Therapy:  Vancomycin trough level 10-15 mcg/ml  Plan:  - Start vancomycin 1500 mg IV Q 12hrs - Cafazolin 1g IV Q 8hrs - F/u clinical course, will check vancomycin level after 3-5 doses  Bayard Hugger, PharmD, BCPS  Clinical Pharmacist  Pager: [phone]  10/12/2011,6:09 PM

## 2011-10-12 NOTE — H&P (Signed)
PCP:   Powell,CATHERINE, MD, MD   Chief Complaint:  Persistent/recurrent lower extremity redness and pain  HPI: The patient is a 61 year old white male with a past medical history significant for lower extremity cellulitis, venous stasis/chronic peripheral edema presents with above complaints. He states that his first episode of cellulitis was about 7 years ago-bilateral affecting his lower extremities, and then beginning February 2013 he developed bilateral cellulitis again. His that he was seen by his primary care provider and started on antibiotics, and has also been to urgent care and was placed on antibiotics. He reports that he has been on at least 2-3 different antibiotics since February and that just prior to admission he had been on Bactrim for about a week. he also states that he has been on support hose to with the venous stasis/chronic swelling. Bryan Powell states that initially his right leg was more swollen, painful and red; but that improved, but in the past 2 weeks he's had worsening of the redness in his left leg as well as swelling and pain.  He states that initially he fevers in February, but has not had any fever since then. He denies any trauma to his legs, in all insect bites. He was seen by his primary care physician and directly admitted for further evaluation and management. Review of Systems:  The patient denies anorexia, fever,, vision loss, decreased hearing, hoarseness, chest pain, syncope, dyspnea on exertion, balance deficits, hemoptysis, abdominal pain, melena, hematochezia, severe indigestion/heartburn, hematuria, incontinence, muscle weakness, suspicious skin lesions, transient blindness, difficulty walking.  Past Medical History: Past Medical History  Diagnosis Date  . Reflux   . Hep C w/o coma, chronic   . Lower extremity venous stasis   . Genital herpes    history of heroine/polysubstance abuse  No past surgical history on file.  Medications: Prior to  Admission medications   Medication Sig Start Date End Date Taking? Authorizing Provider  esomeprazole (NEXIUM) 40 MG capsule Take 40 mg by mouth daily before breakfast.    Historical Provider, MD  minocycline (MINOCIN,DYNACIN) 100 MG capsule Take 100 mg by mouth 2 (two) times daily. 08/05/11 11/03/11  Bryan Bong, MD    Allergies:  No Known Allergies  Social History:  reports that he has never smoked. He does not have any smokeless tobacco history on file. He reports that he does not drink alcohol or use illicit drugs.  Family History: No family history on file.  Physical Exam: Filed Vitals:   10/12/11 1500  BP: 140/87  Pulse: 90  Temp: 98.6 F (37 C)  TempSrc: Oral  Resp: 18  SpO2: 96%  Constitutional: Vital signs reviewed.  Patient is a well-developed and well-nourished in no acute distress and cooperative with exam. Alert and oriented x3.  Head: Normocephalic and atraumatic Mouth: no erythema or exudates, MMM Eyes: PERRL, EOMI, conjunctivae normal, No scleral icterus.  Neck: Supple, Trachea midline normal ROM, No JVD, mass, thyromegaly, or carotid bruit present.  Cardiovascular: RRR, S1 normal, S2 normal, no MRG, pulses symmetric and intact bilaterally Pulmonary/Chest: CTAB, no wheezes, rales, or rhonchi Abdominal: Soft. Non-tender, non-distended, bowel sounds are normal, no masses, organomegaly, or guarding present.  Extremities: Bilateral lower extremity edema-nonpitting, with erythema present from the ankle up to just below knee on the left, on the right -chronic changes/hyperpigmention, with induration posterior medially in the lower leg.no erythema present on the right. Tenderness greater on right than left.  Neurological: A&O x3, Strength is normal and symmetric bilaterally, cranial  nerve II-XII are grossly intact, no focal motor deficit, sensory intact to light touch bilaterally.   Psychiatric: Normal mood and affect. speech and behavior is normal.     Labs on  Admission:  No results found for this basename: NA:2,K:2,CL:2,CO2:2,GLUCOSE:2,BUN:2,CREATININE:2,CALCIUM:2,MG:2,PHOS:2 in the last 72 hours No results found for this basename: AST:2,ALT:2,ALKPHOS:2,BILITOT:2,PROT:2,ALBUMIN:2 in the last 72 hours No results found for this basename: LIPASE:2,AMYLASE:2 in the last 72 hours No results found for this basename: WBC:2,NEUTROABS:2,HGB:2,HCT:2,MCV:2,PLT:2 in the last 72 hours No results found for this basename: CKTOTAL:3,CKMB:3,CKMBINDEX:3,TROPONINI:3 in the last 72 hours No results found for this basename: TSH,T4TOTAL,FREET3,T3FREE,THYROIDAB in the last 72 hours No results found for this basename: VITAMINB12:2,FOLATE:2,FERRITIN:2,TIBC:2,IRON:2,RETICCTPCT:2 in the last 72 hours  Radiological Exams on Admission: No results found.  Assessment/Plan Present on Admission:  .Recurrent cellulitis of lower leg-left greater than right -As discussed above, will start on empiric antibiotics with Ancef and vancomycin -to include coverage for community-acquired MRSA and follow.  -Obtain CBC and follow, pain management. -Will obtain blood cultures if he spikes a temperature.  -We'll also obtain Doppler ultrasound to evaluate for possible DVT.  Marland KitchenLower extremity venous stasis/chronic edema  -Obtain Doppler US As above, follow and continue support hose as appropriate and further recommendations pending studies.  Marland KitchenGERD -Will continue PPI. Marland KitchenHistory of hepatitis C-status post biopsy in the past, and states he was given options and declined treatment. .History of polysubstance abuse-he states he's not used any drugs for the past 5-10 years-per his history cocaine/marijuana, but also per Dr. Adah Powell epic notes that he's had Heroine abuse in the past. .History of genital herpes-will continue prophylactic Valtrex. Bryan Powell C 10/12/2011, 5:08 PM

## 2011-10-13 DIAGNOSIS — M7989 Other specified soft tissue disorders: Secondary | ICD-10-CM

## 2011-10-13 DIAGNOSIS — L02619 Cutaneous abscess of unspecified foot: Secondary | ICD-10-CM

## 2011-10-13 DIAGNOSIS — B182 Chronic viral hepatitis C: Secondary | ICD-10-CM

## 2011-10-13 DIAGNOSIS — M79609 Pain in unspecified limb: Secondary | ICD-10-CM

## 2011-10-13 DIAGNOSIS — I872 Venous insufficiency (chronic) (peripheral): Secondary | ICD-10-CM

## 2011-10-13 DIAGNOSIS — L03039 Cellulitis of unspecified toe: Secondary | ICD-10-CM

## 2011-10-13 LAB — BASIC METABOLIC PANEL
Calcium: 9 mg/dL (ref 8.4–10.5)
GFR calc Af Amer: >90 mL/min (ref >90)
GFR calc non Af Amer: >90 mL/min (ref >90)
Potassium: 4.5 mEq/L (ref 3.5–5.1)
Sodium: 136 mEq/L (ref 135–145)

## 2011-10-13 LAB — CBC
MCH: 27.8 pg (ref 26.0–34.0)
MCHC: 33.6 g/dL (ref 30.0–36.0)
RDW: 14.4 % (ref 11.5–15.5)

## 2011-10-13 MED ORDER — SODIUM CHLORIDE 0.9 % IJ SOLN
10.0000 mL | INTRAMUSCULAR | Status: DC | PRN
  Administered 2011-10-14: 10 mL

## 2011-10-13 NOTE — Progress Notes (Signed)
VASCULAR LAB PRELIMINARY  PRELIMINARY  PRELIMINARY  PRELIMINARY  Bilateral lower extremity venous Dopplers completed.    Preliminary report:  No DVT or SVT noted in the bilateral lower extremities.  Sherren Kerns [place], 10/13/2011, 8:29 AM

## 2011-10-13 NOTE — Progress Notes (Signed)
Utilization review complete 

## 2011-10-13 NOTE — Progress Notes (Signed)
Subjective: States leg swelling and redness better today Objective: Vital signs in last 24 hours: Temp:  [98.3 F (36.8 C)-98.9 F (37.2 C)] 98.9 F (37.2 C) (05/02 1437) Pulse Rate:  [82-94] 94  (05/02 1437) Resp:  [17-19] 18  (05/02 1437) BP: (99-125)/(61-75) 125/75 mmHg (05/02 1437) SpO2:  [93 %-98 %] 98 % (05/02 1437) Last BM Date: 10/12/11 Intake/Output from previous day:   Intake/Output this shift: Total I/O In: 240 [P.O.:240] Out: -     General Appearance:    Alert, cooperative, no distress, appears stated age  Lungs:     Clear to auscultation bilaterally, respirations unlabored   Heart:    Regular rate and rhythm, S1 and S2 normal, no murmur, rub   or gallop  Abdomen:     Soft, non-tender, bowel sounds active all four quadrants,    no masses, no organomegaly  Extremities:   LLE with decreased edema and chronic changes present. RLE wth decreased erythema, edema and decreased tenderness.  Neurologic:   CNII-XII intact, normal strength, nonfocal.    Weight change:   Intake/Output Summary (Last 24 hours) at 10/13/11 1736 Last data filed at 10/13/11 0930  Gross per 24 hour  Intake    240 ml  Output      0 ml  Net    240 ml    Lab Results:   [facility] 10/13/11 0650 10/12/11 1754  NA 136 135  K 4.5 4.3  CL 100 99  CO2 23 25  GLUCOSE 119* 101*  BUN 12 14  CREATININE 0.61 0.62  CALCIUM 9.0 9.4    Basename 10/13/11 0650 10/12/11 1905  WBC 3.9* 5.4  HGB 13.1 13.8  HCT 39.0 39.9  PLT 156 171  MCV 82.8 80.9   PT/INR No results found for this basename: LABPROT:2,INR:2 in the last 72 hours ABG No results found for this basename: PHART:2,PCO2:2,PO2:2,HCO3:2 in the last 72 hours  Micro Results: Recent Results (from the past 240 hour(s))  MRSA PCR SCREENING     Status: Normal   Collection Time   10/13/11  7:23 AM      Component Value Range Status Comment   MRSA by PCR NEGATIVE  NEGATIVE  Final    Studies/Results: No results found. Medications:    Scheduled Meds:   .  ceFAZolin (ANCEF) IV  1 g Intravenous Q8H  . cyclobenzaprine  5 mg Oral QHS  . enoxaparin  40 mg Subcutaneous Q24H  . pantoprazole  40 mg Oral Q1200  . senna  1 tablet Oral BID  . valACYclovir  500 mg Oral BID  . vancomycin  1,500 mg Intravenous Q12H   Continuous Infusions:  PRN Meds:.bisacodyl, LORazepam, magnesium citrate, ondansetron (ZOFRAN) IV, ondansetron, oxyCODONE, sodium chloride, traMADol Assessment/Plan: Recurrent cellulitis of lower leg-left greater than right  -clinically improving, continue current antibiotics -doppler US neg. For DVT. - if continues to improve and remaining afebrile will plan to d/c on oral antibiotics in the am .Lower extremity venous stasis/chronic edema  -BNP wnl, [place] neg for DVT as above - continue elevation of LE, and stockings - pt states he does not want any diuretics as he did not like how they made him feel the last time. Marland KitchenGERD  - continue PPI.  Marland KitchenHistory of hepatitis C-status post biopsy in the past, and states he was given options and declined treatment.  .History of polysubstance abuse-he states he's not used any drugs for the past 5-10 years-per his history cocaine/marijuana, but also per Dr. Adah Perl epic notes  that he's had Heroine abuse in the past.  .History of genital herpes-on prophylactic Valtrex   LOS: 1 day   Bryan Powell C 10/13/2011, 5:36 PM

## 2011-10-14 DIAGNOSIS — L03039 Cellulitis of unspecified toe: Secondary | ICD-10-CM

## 2011-10-14 DIAGNOSIS — B182 Chronic viral hepatitis C: Secondary | ICD-10-CM

## 2011-10-14 DIAGNOSIS — I872 Venous insufficiency (chronic) (peripheral): Secondary | ICD-10-CM

## 2011-10-14 DIAGNOSIS — L02619 Cutaneous abscess of unspecified foot: Secondary | ICD-10-CM

## 2011-10-14 LAB — BASIC METABOLIC PANEL
CO2: 29 mEq/L (ref 19–32)
Calcium: 8.8 mg/dL (ref 8.4–10.5)
Creatinine, Ser: 0.58 mg/dL (ref 0.50–1.35)
GFR calc non Af Amer: >90 mL/min (ref >90)
Glucose, Bld: 120 mg/dL — ABNORMAL HIGH (ref 70–99)

## 2011-10-14 MED ORDER — OXYCODONE HCL 10 MG PO TB12: 10.0000 mg | ORAL_TABLET | Freq: Two times a day (BID) | ORAL | Status: DC

## 2011-10-14 MED ORDER — TRAMADOL HCL 50 MG PO TABS: 50.0000 mg | ORAL_TABLET | Freq: Four times a day (QID) | ORAL | Status: DC | PRN

## 2011-10-14 MED ORDER — CYCLOBENZAPRINE HCL 10 MG PO TABS: 10.0000 mg | ORAL_TABLET | Freq: Every evening | ORAL | Status: DC | PRN

## 2011-10-14 MED ORDER — DOXYCYCLINE HYCLATE 50 MG PO CAPS: 50.0000 mg | ORAL_CAPSULE | Freq: Two times a day (BID) | ORAL | Status: AC

## 2011-10-14 MED ORDER — ESOMEPRAZOLE MAGNESIUM 40 MG PO CPDR: 40.0000 mg | DELAYED_RELEASE_CAPSULE | Freq: Every day | ORAL | Status: DC

## 2011-10-14 MED ORDER — SENNA 8.6 MG PO TABS: 1.0000 | ORAL_TABLET | Freq: Two times a day (BID) | ORAL | Status: DC

## 2011-10-14 MED ORDER — LORAZEPAM 0.5 MG PO TABS: 0.5000 mg | ORAL_TABLET | Freq: Two times a day (BID) | ORAL | Status: DC | PRN

## 2011-10-14 MED ORDER — VALACYCLOVIR HCL 500 MG PO TABS: 500.0000 mg | ORAL_TABLET | Freq: Two times a day (BID) | ORAL | Status: DC

## 2011-10-14 NOTE — Discharge Summary (Signed)
Discharge Note  Name: Bryan Powell MRN: [id number] DOB: [birth year] 61 y.o.  Date of Admission: 10/12/2011  2:47 PM Date of Discharge: 10/14/2011 Attending Physician: Kela Millin, MD  Discharge Diagnosis: Active Problems:  Recurrent cellulitis of lower leg  GERD  Lower extremity venous stasis   Discharge Medications: Medication List  As of 10/14/2011  5:33 PM   STOP taking these medications         sulfamethoxazole-trimethoprim 800-160 MG per tablet         TAKE these medications         cyclobenzaprine 10 MG tablet   Commonly known as: FLEXERIL   Take 1 tablet (10 mg total) by mouth at bedtime as needed. For muscle spasms.      doxycycline 50 MG capsule   Commonly known as: VIBRAMYCIN   Take 1 capsule (50 mg total) by mouth 2 (two) times daily.      esomeprazole 40 MG capsule   Commonly known as: NEXIUM   Take 1 capsule (40 mg total) by mouth daily before breakfast.      LORazepam 0.5 MG tablet   Commonly known as: ATIVAN   Take 1 tablet (0.5 mg total) by mouth 2 (two) times daily as needed. For anxiety.      oxyCODONE 10 MG 12 hr tablet   Commonly known as: OXYCONTIN   Take 1-2 tablets (10-20 mg total) by mouth every 12 (twelve) hours. For pain.      senna 8.6 MG Tabs   Commonly known as: SENOKOT   Take 1 tablet (8.6 mg total) by mouth 2 (two) times daily.      traMADol 50 MG tablet   Commonly known as: ULTRAM   Take 1 tablet (50 mg total) by mouth every 6 (six) hours as needed.      valACYclovir 500 MG tablet   Commonly known as: VALTREX   Take 1 tablet (500 mg total) by mouth 2 (two) times daily.            Disposition and follow-up:   Bryan Powell was discharged from [facility] in improved/stable condition.    Follow-up Appointments: Discharge Orders    Future Orders Please Complete By Expires   Diet - low sodium heart healthy      Increase activity slowly      Discharge instructions      Comments:   Continue  support hose Keep legs elevated when lying or sitting.      Consultations:    Procedures Performed:   Doppler ultrasound  -negative for DVT .  Admission HPI The patient is a 61 year old white male with a past medical history significant for lower extremity cellulitis, venous stasis/chronic peripheral edema presents with above complaints. He states that his first episode of cellulitis was about 7 years ago-bilateral affecting his lower extremities, and then beginning February 2013 he developed bilateral cellulitis again. His that he was seen by his primary care provider and started on antibiotics, and has also been to urgent care and was placed on antibiotics. He reports that he has been on at least 2-3 different antibiotics since February and that just prior to admission he had been on Bactrim for about a week. he also states that he has been on support hose to with the venous stasis/chronic swelling. Bryan Powell states that initially his right leg was more swollen, painful and red; but that improved, but in the past 2 weeks he's had worsening  of the redness in his left leg as well as swelling and pain.  He states that initially he fevers in February, but has not had any fever since then. He denies any trauma to his legs, in all insect bites. He was seen by his primary care physician and directly admitted for further evaluation and management.    Hospital Course by problem list: Active Problems:  Recurrent cellulitis of lower leg  GERD  Lower extremity venous stasis  Recurrent cellulitis of lower leg-left greater than right  Upon admission the patient was placed on empiric iv antibiotics with vancomycin and Ancef. -doppler US neg. For DVT.  -Patient remained with no leukocytosis and has been afebrile so no cultures were done. - He had significant clinical improvement in his cellulitis and medical he stable for discharge home at this time on oral antibiotics. -His to continue with her  support hose and keep legs elevated as directed. -He is to follow up with his primary care physician in one to 2 weeks. .Lower extremity venous stasis/chronic edema  -BNP wnl, [place] neg for DVT as above  - He is to continue elevation of LE when sitting or lying, and stockings as directed - pt states he does not want any diuretics as he did not like how they made him feel the last time. -His leg swelling improved significantly while in the hospital.  .GERD  - continue PPI.  Marland KitchenHistory of hepatitis C-status post biopsy in the past, and states he was given options and declined treatment.  .History of polysubstance abuse-he denied using any drugs for the past 5-10 years-per his history cocaine/marijuana, but also per Dr. Adah Perl epic notes that he's had Heroine abuse in the past.  .History of genital herpes-on prophylactic Valtrex  Discharge Vitals:  BP 118/69  Pulse 92  Temp(Src) 97.3 F (36.3 C) (Oral)  Resp 16  Ht 5\' 11"  (1.803 m)  Wt 84.369 kg (186 lb)  BMI 25.94 kg/m2  SpO2 100%  Discharge Labs:  Results for orders placed during the hospital encounter of 10/12/11 (from the past 24 hour(s))  BASIC METABOLIC PANEL     Status: Abnormal   Collection Time   10/14/11  7:50 AM      Component Value Range   Sodium 136  135 - 145 (mEq/L)   Potassium 3.7  3.5 - 5.1 (mEq/L)   Chloride 101  96 - 112 (mEq/L)   CO2 29  19 - 32 (mEq/L)   Glucose, Bld 120 (*) 70 - 99 (mg/dL)   BUN 10  6 - 23 (mg/dL)   Creatinine, Ser 1.61  0.50 - 1.35 (mg/dL)   Calcium 8.8  8.4 - 09.6 (mg/dL)   GFR calc non Af Amer >90  >90 (mL/min)   GFR calc Af Amer >90  >90 (mL/min)    SignedKela Millin 10/14/2011, 5:33 PM

## 2011-11-28 ENCOUNTER — Emergency Department (INDEPENDENT_AMBULATORY_CARE_PROVIDER_SITE_OTHER)
Admission: EM | Admit: 2011-11-28 | Discharge: 2011-11-28 | Disposition: A | Discharge status: Home Health | Payer: BC Managed Care – PPO | Source: Home / Self Care | Attending: Emergency Medicine | Admitting: Emergency Medicine

## 2011-11-28 ENCOUNTER — Encounter (HOSPITAL_COMMUNITY): Payer: Self-pay

## 2011-11-28 DIAGNOSIS — L03119 Cellulitis of unspecified part of limb: Secondary | ICD-10-CM

## 2011-11-28 MED ORDER — DOXYCYCLINE HYCLATE 100 MG PO TABS: 100.0000 mg | ORAL_TABLET | Freq: Two times a day (BID) | ORAL | Status: AC

## 2011-11-28 MED ORDER — CEFTRIAXONE SODIUM 1 G IJ SOLR
INTRAMUSCULAR | Status: AC
  Filled 2011-11-28: qty 10

## 2011-11-28 MED ORDER — CEFTRIAXONE SODIUM 1 G IJ SOLR
1.0000 g | Freq: Once | INTRAMUSCULAR | Status: AC
  Administered 2011-11-28: 1 g via INTRAMUSCULAR

## 2011-11-28 MED ORDER — LIDOCAINE HCL (PF) 1 % IJ SOLN
INTRAMUSCULAR | Status: AC
  Filled 2011-11-28: qty 5

## 2011-11-28 NOTE — ED Provider Notes (Signed)
Bryan Powell is a 61 y.o. male who presents to Urgent Care today for cellulitis of hand.  Patient is IV drug user who noted cellulitis of his left hand starting a few days ago. Additionally he has some spots on his right forearm that are also becoming infected.  Denies any fevers chills nausea or vomiting.  He feels well and is not having trouble moving his hand.  He has chronic swelling of his hands and his legs which started before his cellulitis.  He does not want to go to the emergency room.  He would like to try outpatient therapy to prevent abscess formation.   PMH reviewed. IV drug use with history of cellulitis History  Substance Use Topics  . Smoking status: Former Games developer  . Smokeless tobacco: Never Used  . Alcohol Use: No   ROS as above Medications reviewed. Current Facility-Administered Medications  Medication Dose Route Frequency Provider Last Rate Last Dose  . cefTRIAXone (ROCEPHIN) injection 1 g  1 g Intramuscular Once Rodolph Bong, MD       Current Outpatient Prescriptions  Medication Sig Dispense Refill  . cyclobenzaprine (FLEXERIL) 10 MG tablet Take 1 tablet (10 mg total) by mouth at bedtime as needed. For muscle spasms.  10 tablet  0  . doxycycline (VIBRA-TABS) 100 MG tablet Take 1 tablet (100 mg total) by mouth 2 (two) times daily.  20 tablet  0  . esomeprazole (NEXIUM) 40 MG capsule Take 1 capsule (40 mg total) by mouth daily before breakfast.  30 capsule  0  . LORazepam (ATIVAN) 0.5 MG tablet Take 1 tablet (0.5 mg total) by mouth 2 (two) times daily as needed. For anxiety.  20 tablet  0  . oxyCODONE (OXYCONTIN) 10 MG 12 hr tablet Take 1-2 tablets (10-20 mg total) by mouth every 12 (twelve) hours. For pain.  30 tablet  0  . senna (SENOKOT) 8.6 MG TABS Take 1 tablet (8.6 mg total) by mouth 2 (two) times daily.  60 each  0  . traMADol (ULTRAM) 50 MG tablet Take 1 tablet (50 mg total) by mouth every 6 (six) hours as needed.  20 tablet  0  . valACYclovir (VALTREX) 500  MG tablet Take 1 tablet (500 mg total) by mouth 2 (two) times daily.  20 tablet  0    Exam:  BP 123/77  Pulse 86  Temp 98.7 F (37.1 C) (Oral)  Resp 18  SpO2 100% Gen: Well NAD HEENT: EOMI,  MMM Lungs: CTABL Nl WOB Heart: RRR no  murmurs rubs or gallops Abd: NABS, NT, ND Exts: Non edematous BL  LE, warm and well perfused.  Skin: Erythema warmth and induration at the fourth digit MCP of the left hand.  Small erythematous nodules of the right forearm also present.  No splinter hemorrhages present throughout.      No results found for this or any previous visit (from the past 24 hour(s)). No results found.  Assessment and Plan: 61 y.o. male with  cellulitis secondary to IV drug use.  Plan to give IM ceftriaxone injection followed by oral doxycycline with close followup.  Asked patient to come back tomorrow for reevaluation. I also carefully reviewed precautions that would cause him to go to the emergency room. Discussed with patient quitting IV drug use.  Additionally we discussed how to use IV drugs in a manner that would prevent skin infections.  Discussed warning signs or symptoms. Please see discharge instructions. Patient expresses understanding.  Rodolph Bong, MD 11/28/11 1816

## 2011-11-28 NOTE — Discharge Instructions (Signed)
Thank you for coming in today. Please come back tomorrow.  If you get worse or have a fever go to the ER. If you hand starts to hurt please go to the ER.  If you cannot get the antibiotics please go to the ER.  Cellulitis Cellulitis is an infection of the tissue under the skin. The infected area is usually red and tender. This is caused by germs. These germs enter the body through cuts or sores. This usually happens in the arms or lower legs. HOME CARE   Take your medicine as told. Finish it even if you start to feel better.   If the infection is on the arm or leg, keep it raised (elevated).   Use a warm cloth on the infected area several times a day.   See your doctor for a follow-up visit as told.  GET HELP RIGHT AWAY IF:   You are tired or confused.   You throw up (vomit).   You have watery poop (diarrhea).   You feel ill and have muscle aches.   You have a fever.  MAKE SURE YOU:   Understand these instructions.   Will watch your condition.   Will get help right away if you are not doing well or get worse.  Document Released: 11/16/2007 Document Revised: 05/19/2011 Document Reviewed: 05/01/2009 [facility] Patient Information 2012 [place], [place].

## 2011-11-28 NOTE — ED Notes (Signed)
Pt c/o abcess to bilateral hands and R forearm.  Pt states he has HX of same.  Onset of swelling occurred a few days ago.

## 2011-11-29 NOTE — ED Provider Notes (Signed)
Medical screening examination/treatment/procedure(s) were performed by non-physician practitioner and as supervising physician I was immediately available for consultation/collaboration.   [facility]; MD   Sharin Grave, MD 11/29/11 1530

## 2012-01-31 ENCOUNTER — Encounter (HOSPITAL_COMMUNITY): Payer: Self-pay

## 2012-01-31 ENCOUNTER — Emergency Department (HOSPITAL_COMMUNITY)
Admission: EM | Admit: 2012-01-31 | Discharge: 2012-01-31 | Disposition: A | Discharge status: Home / Self Care | Payer: BC Managed Care – PPO | Source: Home / Self Care | Attending: Emergency Medicine | Admitting: Emergency Medicine

## 2012-01-31 DIAGNOSIS — T148XXA Other injury of unspecified body region, initial encounter: Secondary | ICD-10-CM

## 2012-01-31 DIAGNOSIS — Z23 Encounter for immunization: Secondary | ICD-10-CM

## 2012-01-31 MED ORDER — TETANUS-DIPHTH-ACELL PERTUSSIS 5-2.5-18.5 LF-MCG/0.5 IM SUSP
0.5000 mL | Freq: Once | INTRAMUSCULAR | Status: AC
  Administered 2012-01-31: 0.5 mL via INTRAMUSCULAR

## 2012-01-31 MED ORDER — CEPHALEXIN 500 MG PO CAPS: 500.0000 mg | ORAL_CAPSULE | Freq: Three times a day (TID) | ORAL | Status: AC

## 2012-01-31 MED ORDER — KETOROLAC TROMETHAMINE 10 MG PO TABS: 10.0000 mg | ORAL_TABLET | Freq: Four times a day (QID) | ORAL | Status: AC | PRN

## 2012-01-31 MED ORDER — TETANUS-DIPHTH-ACELL PERTUSSIS 5-2.5-18.5 LF-MCG/0.5 IM SUSP
INTRAMUSCULAR | Status: AC
  Filled 2012-01-31: qty 0.5

## 2012-01-31 NOTE — ED Notes (Signed)
Dressing applied by MD

## 2012-01-31 NOTE — ED Notes (Signed)
State he sustained a puncture wound to right hand w hedge shears last pm; c/o pain "91/2 of 10" ; calm , conversant, w/d/color good ; no significant swelling observed

## 2012-01-31 NOTE — ED Provider Notes (Signed)
Chief Complaint  Patient presents with  . Hand Injury    History of Present Illness:   The patient sustained a puncture wound on the thenar eminence of his right hand yesterday with apparent garden shears. This was just a superficial puncture. There's been no purulent drainage, no erythema, but he reports this is very painful rated 9.5/10 in intensity. The patient has a known history of IV drug abuse. I reviewed the [facility] and found that he just gotten 60 hydrocodone from a Dr. Ronne Binning on July 29. He says he still has some of these left but they're not doing any good in controlling the pain. He cannot recall when his last tetanus vaccine was.  Review of Systems:  Other than noted above, the patient denies any of the following symptoms: Systemic:  No fever or chills. Musculoskeletal:  No joint pain or decreased range of motion. Neuro:  No numbness, tingling, or weakness.  PMFSH:  Past medical history, family history, social history, meds, and allergies were reviewed.  Physical Exam:   Vital signs:  BP 146/87  Pulse 88  Temp 98.4 F (36.9 C) (Oral)  Resp 16  SpO2 95% Ext:  There is a small puncture wound on the thenar eminence of the right hand. There is no surrounding erythema, no purulent drainage, the patient does report pain to palpation. All joints had a full ROM without pain.  Pulses were full.  Good capillary refill in all digits.  No edema. His hands had a puffy appearance and more erythematous. He states that they have always looked this way. He attributes it to poor circulation, however he has excellent capillary refill and full pulses. Neurological:  Alert and oriented.  No muscle weakness.  Sensation was intact to light touch.         Medications given in [facility]:  He was given a Tdap vaccine and tolerated this well without any immediate side effects.  Assessment:  The encounter diagnosis was Puncture wound. His pain seems out of proportion to the severity of  the wound. I am reluctant to prescribe opioids since he has a history of IV drug abuse. He was given a prescription for ketorolac. Later the pharmacy called back and stated that there was a black box warning that this should not be given unless the first and given an IM injection of the same medication which he did not. It's place a prescription was called in for a diclofenac 75 mg #20 one twice a day with food as needed for pain.  Plan:   1.  The following meds were prescribed:   New Prescriptions   CEPHALEXIN (KEFLEX) 500 MG CAPSULE    Take 1 capsule (500 mg total) by mouth 3 (three) times daily.   KETOROLAC (TORADOL) 10 MG TABLET    Take 1 tablet (10 mg total) by mouth every 6 (six) hours as needed for pain.   2.  The patient was instructed in wound care and pain control, and handouts were given. 3.  The patient was told to return if any sign of infection.      Reuben Likes, MD 01/31/12 2103

## 2012-01-31 NOTE — ED Notes (Signed)
Call from pharmacist asking for clarification on Rx; after review, Dr Lorenz Coaster authorized diclofinac 75mg , #20, 1 tab BID po, with food

## 2012-03-29 ENCOUNTER — Encounter: Payer: Self-pay | Admitting: Gastroenterology

## 2012-06-09 ENCOUNTER — Encounter (HOSPITAL_COMMUNITY): Payer: Self-pay | Admitting: *Deleted

## 2012-06-09 ENCOUNTER — Emergency Department (HOSPITAL_COMMUNITY): Payer: BC Managed Care – PPO

## 2012-06-09 ENCOUNTER — Emergency Department (HOSPITAL_COMMUNITY): Payer: BC Managed Care – PPO | Admitting: Anesthesiology

## 2012-06-09 ENCOUNTER — Encounter (HOSPITAL_COMMUNITY)
Admission: EM | Disposition: A | Discharge status: Home / Self Care | Payer: Self-pay | Source: Home / Self Care | Attending: Internal Medicine

## 2012-06-09 ENCOUNTER — Encounter (HOSPITAL_COMMUNITY): Payer: Self-pay | Admitting: Anesthesiology

## 2012-06-09 ENCOUNTER — Inpatient Hospital Stay (HOSPITAL_COMMUNITY)
Admission: EM | Admit: 2012-06-09 | Discharge: 2012-06-12 | DRG: 441 | Disposition: A | Discharge status: Home / Self Care | Payer: BC Managed Care – PPO | Attending: Internal Medicine | Admitting: Internal Medicine

## 2012-06-09 DIAGNOSIS — B1920 Unspecified viral hepatitis C without hepatic coma: Secondary | ICD-10-CM

## 2012-06-09 DIAGNOSIS — I878 Other specified disorders of veins: Secondary | ICD-10-CM

## 2012-06-09 DIAGNOSIS — L089 Local infection of the skin and subcutaneous tissue, unspecified: Secondary | ICD-10-CM | POA: Diagnosis present

## 2012-06-09 DIAGNOSIS — M545 Low back pain: Secondary | ICD-10-CM

## 2012-06-09 DIAGNOSIS — L03119 Cellulitis of unspecified part of limb: Secondary | ICD-10-CM

## 2012-06-09 DIAGNOSIS — S61209A Unspecified open wound of unspecified finger without damage to nail, initial encounter: Principal | ICD-10-CM | POA: Diagnosis present

## 2012-06-09 DIAGNOSIS — F1120 Opioid dependence, uncomplicated: Secondary | ICD-10-CM | POA: Diagnosis present

## 2012-06-09 DIAGNOSIS — I872 Venous insufficiency (chronic) (peripheral): Secondary | ICD-10-CM | POA: Diagnosis present

## 2012-06-09 DIAGNOSIS — A6000 Herpesviral infection of urogenital system, unspecified: Secondary | ICD-10-CM | POA: Diagnosis present

## 2012-06-09 DIAGNOSIS — Z87891 Personal history of nicotine dependence: Secondary | ICD-10-CM

## 2012-06-09 DIAGNOSIS — M65839 Other synovitis and tenosynovitis, unspecified forearm: Secondary | ICD-10-CM | POA: Diagnosis present

## 2012-06-09 DIAGNOSIS — K219 Gastro-esophageal reflux disease without esophagitis: Secondary | ICD-10-CM

## 2012-06-09 DIAGNOSIS — Z23 Encounter for immunization: Secondary | ICD-10-CM

## 2012-06-09 DIAGNOSIS — F19939 Other psychoactive substance use, unspecified with withdrawal, unspecified: Secondary | ICD-10-CM | POA: Diagnosis not present

## 2012-06-09 DIAGNOSIS — J449 Chronic obstructive pulmonary disease, unspecified: Secondary | ICD-10-CM | POA: Diagnosis present

## 2012-06-09 DIAGNOSIS — N529 Male erectile dysfunction, unspecified: Secondary | ICD-10-CM

## 2012-06-09 DIAGNOSIS — L02519 Cutaneous abscess of unspecified hand: Secondary | ICD-10-CM

## 2012-06-09 DIAGNOSIS — J4489 Other specified chronic obstructive pulmonary disease: Secondary | ICD-10-CM | POA: Diagnosis present

## 2012-06-09 DIAGNOSIS — F411 Generalized anxiety disorder: Secondary | ICD-10-CM | POA: Diagnosis present

## 2012-06-09 DIAGNOSIS — J309 Allergic rhinitis, unspecified: Secondary | ICD-10-CM

## 2012-06-09 DIAGNOSIS — W460XXA Contact with hypodermic needle, initial encounter: Secondary | ICD-10-CM | POA: Diagnosis present

## 2012-06-09 DIAGNOSIS — B182 Chronic viral hepatitis C: Secondary | ICD-10-CM | POA: Diagnosis present

## 2012-06-09 DIAGNOSIS — Z9089 Acquired absence of other organs: Secondary | ICD-10-CM

## 2012-06-09 DIAGNOSIS — A4901 Methicillin susceptible Staphylococcus aureus infection, unspecified site: Secondary | ICD-10-CM | POA: Diagnosis present

## 2012-06-09 DIAGNOSIS — Y929 Unspecified place or not applicable: Secondary | ICD-10-CM

## 2012-06-09 HISTORY — PX: I & D EXTREMITY: SHX5045

## 2012-06-09 LAB — CBC WITH DIFFERENTIAL/PLATELET
Basophils Absolute: 0 10*3/uL (ref 0.0–0.1)
Basophils Relative: 0 % (ref 0–1)
Eosinophils Absolute: 0.2 10*3/uL (ref 0.0–0.7)
Eosinophils Relative: 2 % (ref 0–5)
Lymphs Abs: 3.1 10*3/uL (ref 0.7–4.0)
MCH: 27.7 pg (ref 26.0–34.0)
MCHC: 33.3 g/dL (ref 30.0–36.0)
MCV: 83.4 fL (ref 78.0–100.0)
Platelets: 132 10*3/uL — ABNORMAL LOW (ref 150–400)
RDW: 13.5 % (ref 11.5–15.5)

## 2012-06-09 LAB — COMPREHENSIVE METABOLIC PANEL
ALT: 30 U/L (ref 0–53)
Albumin: 3.8 g/dL (ref 3.5–5.2)
Calcium: 9.5 mg/dL (ref 8.4–10.5)
GFR calc Af Amer: >90 mL/min (ref >90)
Glucose, Bld: 86 mg/dL (ref 70–99)
Sodium: 133 mEq/L — ABNORMAL LOW (ref 135–145)
Total Protein: 7.7 g/dL (ref 6.0–8.3)

## 2012-06-09 SURGERY — IRRIGATION AND DEBRIDEMENT EXTREMITY
Anesthesia: General | Site: Hand | Laterality: Left | Wound class: Dirty or Infected

## 2012-06-09 MED ORDER — LIDOCAINE HCL (CARDIAC) 20 MG/ML IV SOLN
INTRAVENOUS | Status: DC | PRN
  Administered 2012-06-09: 100 mg via INTRAVENOUS

## 2012-06-09 MED ORDER — LACTATED RINGERS IV SOLN
INTRAVENOUS | Status: DC | PRN
  Administered 2012-06-09 (×2): via INTRAVENOUS

## 2012-06-09 MED ORDER — ADULT MULTIVITAMIN W/MINERALS CH
1.0000 | ORAL_TABLET | Freq: Every day | ORAL | Status: DC
  Administered 2012-06-10 – 2012-06-12 (×3): 1 via ORAL
  Filled 2012-06-09 (×3): qty 1

## 2012-06-09 MED ORDER — HYDROMORPHONE HCL PF 1 MG/ML IJ SOLN
0.2500 mg | INTRAMUSCULAR | Status: DC | PRN
  Administered 2012-06-09 (×4): 0.5 mg via INTRAVENOUS

## 2012-06-09 MED ORDER — TRAZODONE HCL 100 MG PO TABS
100.0000 mg | ORAL_TABLET | Freq: Every day | ORAL | Status: DC
  Administered 2012-06-10 – 2012-06-11 (×3): 100 mg via ORAL
  Filled 2012-06-09 (×4): qty 1

## 2012-06-09 MED ORDER — SODIUM CHLORIDE 0.9 % IV BOLUS (SEPSIS): 500.0000 mL | Freq: Once | INTRAVENOUS | Status: DC

## 2012-06-09 MED ORDER — SODIUM CHLORIDE 0.9 % IR SOLN
Status: DC | PRN
  Administered 2012-06-09: 3000 mL

## 2012-06-09 MED ORDER — MIDAZOLAM HCL 5 MG/5ML IJ SOLN
INTRAMUSCULAR | Status: DC | PRN
  Administered 2012-06-09: 2 mg via INTRAVENOUS

## 2012-06-09 MED ORDER — HYDROMORPHONE HCL PF 1 MG/ML IJ SOLN
INTRAMUSCULAR | Status: AC
  Filled 2012-06-09: qty 1

## 2012-06-09 MED ORDER — VANCOMYCIN HCL 10 G IV SOLR: 1250.0000 mg | Freq: Two times a day (BID) | INTRAVENOUS | Status: DC

## 2012-06-09 MED ORDER — ARTIFICIAL TEARS OP OINT
TOPICAL_OINTMENT | OPHTHALMIC | Status: DC | PRN
  Administered 2012-06-09: 1 via OPHTHALMIC

## 2012-06-09 MED ORDER — VANCOMYCIN HCL 10 G IV SOLR
1250.0000 mg | Freq: Two times a day (BID) | INTRAVENOUS | Status: DC
  Administered 2012-06-10 – 2012-06-12 (×3): 1250 mg via INTRAVENOUS
  Filled 2012-06-09 (×6): qty 1250

## 2012-06-09 MED ORDER — PROMETHAZINE HCL 25 MG/ML IJ SOLN
6.2500 mg | INTRAMUSCULAR | Status: DC | PRN
  Filled 2012-06-09: qty 1

## 2012-06-09 MED ORDER — VALACYCLOVIR HCL 500 MG PO TABS
500.0000 mg | ORAL_TABLET | Freq: Two times a day (BID) | ORAL | Status: DC
  Administered 2012-06-10 – 2012-06-12 (×6): 500 mg via ORAL
  Filled 2012-06-09 (×7): qty 1

## 2012-06-09 MED ORDER — ONDANSETRON HCL 4 MG/2ML IJ SOLN
INTRAMUSCULAR | Status: DC | PRN
  Administered 2012-06-09: 4 mg via INTRAVENOUS

## 2012-06-09 MED ORDER — VANCOMYCIN HCL IN DEXTROSE 1-5 GM/200ML-% IV SOLN
1000.0000 mg | Freq: Once | INTRAVENOUS | Status: AC
  Administered 2012-06-09: 1000 mg via INTRAVENOUS

## 2012-06-09 MED ORDER — CYCLOBENZAPRINE HCL 10 MG PO TABS
10.0000 mg | ORAL_TABLET | Freq: Every evening | ORAL | Status: DC | PRN
  Administered 2012-06-10 – 2012-06-11 (×2): 10 mg via ORAL
  Filled 2012-06-09 (×2): qty 1

## 2012-06-09 MED ORDER — PANTOPRAZOLE SODIUM 40 MG PO TBEC
80.0000 mg | DELAYED_RELEASE_TABLET | Freq: Every day | ORAL | Status: DC
  Administered 2012-06-10 – 2012-06-12 (×3): 80 mg via ORAL
  Filled 2012-06-09: qty 2
  Filled 2012-06-09 (×2): qty 1
  Filled 2012-06-09: qty 2

## 2012-06-09 MED ORDER — SUCCINYLCHOLINE CHLORIDE 20 MG/ML IJ SOLN
INTRAMUSCULAR | Status: DC | PRN
  Administered 2012-06-09: 120 mg via INTRAVENOUS

## 2012-06-09 MED ORDER — SENNA 8.6 MG PO TABS
1.0000 | ORAL_TABLET | Freq: Two times a day (BID) | ORAL | Status: DC
  Administered 2012-06-10 – 2012-06-12 (×6): 8.6 mg via ORAL
  Filled 2012-06-09 (×8): qty 1

## 2012-06-09 MED ORDER — FENTANYL CITRATE 0.05 MG/ML IJ SOLN
INTRAMUSCULAR | Status: DC | PRN
  Administered 2012-06-09: 100 ug via INTRAVENOUS
  Administered 2012-06-09 (×3): 50 ug via INTRAVENOUS

## 2012-06-09 MED ORDER — PROPOFOL 10 MG/ML IV BOLUS
INTRAVENOUS | Status: DC | PRN
  Administered 2012-06-09: 50 mg via INTRAVENOUS
  Administered 2012-06-09: 300 mg via INTRAVENOUS

## 2012-06-09 SURGICAL SUPPLY — 60 items
BANDAGE CONFORM 2  STR LF (GAUZE/BANDAGES/DRESSINGS) ×1 IMPLANT
BANDAGE ELASTIC 3 VELCRO ST LF (GAUZE/BANDAGES/DRESSINGS) ×2 IMPLANT
BANDAGE ELASTIC 4 VELCRO ST LF (GAUZE/BANDAGES/DRESSINGS) ×3 IMPLANT
BANDAGE GAUZE ELAST BULKY 4 IN (GAUZE/BANDAGES/DRESSINGS) ×3 IMPLANT
BNDG CMPR 9X4 STRL LF SNTH (GAUZE/BANDAGES/DRESSINGS)
BNDG COHESIVE 1X5 TAN STRL LF (GAUZE/BANDAGES/DRESSINGS) IMPLANT
BNDG ESMARK 4X9 LF (GAUZE/BANDAGES/DRESSINGS) ×1 IMPLANT
CLOTH BEACON ORANGE TIMEOUT ST (SAFETY) ×2 IMPLANT
CORDS BIPOLAR (ELECTRODE) ×2 IMPLANT
COVER SURGICAL LIGHT HANDLE (MISCELLANEOUS) ×2 IMPLANT
CUFF TOURNIQUET SINGLE 18IN (TOURNIQUET CUFF) ×2 IMPLANT
CUFF TOURNIQUET SINGLE 24IN (TOURNIQUET CUFF) IMPLANT
DRAIN PENROSE 1/4X12 LTX STRL (WOUND CARE) IMPLANT
DRAPE SURG 17X23 STRL (DRAPES) ×2 IMPLANT
DRSG ADAPTIC 3X8 NADH LF (GAUZE/BANDAGES/DRESSINGS) ×2 IMPLANT
ELECT REM PT RETURN 9FT ADLT (ELECTROSURGICAL)
ELECTRODE REM PT RTRN 9FT ADLT (ELECTROSURGICAL) IMPLANT
GAUZE XEROFORM 1X8 LF (GAUZE/BANDAGES/DRESSINGS) ×1 IMPLANT
GAUZE XEROFORM 5X9 LF (GAUZE/BANDAGES/DRESSINGS) IMPLANT
GLOVE BIO SURGEON STRL SZ8 (GLOVE) ×2 IMPLANT
GLOVE BIOGEL PI IND STRL 8.5 (GLOVE) ×1 IMPLANT
GLOVE BIOGEL PI INDICATOR 8.5 (GLOVE) ×2
GLOVE SURG ORTHO 8.0 STRL STRW (GLOVE) ×2 IMPLANT
GOWN PREVENTION PLUS XLARGE (GOWN DISPOSABLE) ×3 IMPLANT
GOWN STRL NON-REIN LRG LVL3 (GOWN DISPOSABLE) ×3 IMPLANT
HANDPIECE INTERPULSE COAX TIP (DISPOSABLE)
IV NS IRRIG 3000ML ARTHROMATIC (IV SOLUTION) ×1 IMPLANT
KIT BASIN OR (CUSTOM PROCEDURE TRAY) ×2 IMPLANT
KIT ROOM TURNOVER OR (KITS) ×2 IMPLANT
LOOP VESSEL MAXI BLUE (MISCELLANEOUS) ×1 IMPLANT
MANIFOLD NEPTUNE II (INSTRUMENTS) ×2 IMPLANT
NDL HYPO 25GX1X1/2 BEV (NEEDLE) IMPLANT
NEEDLE HYPO 25GX1X1/2 BEV (NEEDLE) IMPLANT
NS IRRIG 1000ML POUR BTL (IV SOLUTION) ×1 IMPLANT
PACK ORTHO EXTREMITY (CUSTOM PROCEDURE TRAY) ×2 IMPLANT
PAD ARMBOARD 7.5X6 YLW CONV (MISCELLANEOUS) ×4 IMPLANT
PAD CAST 4YDX4 CTTN HI CHSV (CAST SUPPLIES) ×1 IMPLANT
PADDING CAST COTTON 4X4 STRL (CAST SUPPLIES) ×4
SET HNDPC FAN SPRY TIP SCT (DISPOSABLE) IMPLANT
SOAP 2 % CHG 4 OZ (WOUND CARE) ×2 IMPLANT
SPLINT FIBERGLASS 4X30 (CAST SUPPLIES) ×1 IMPLANT
SPONGE GAUZE 4X4 12PLY (GAUZE/BANDAGES/DRESSINGS) ×3 IMPLANT
SPONGE LAP 18X18 X RAY DECT (DISPOSABLE) ×2 IMPLANT
SPONGE LAP 4X18 X RAY DECT (DISPOSABLE) ×1 IMPLANT
SUCTION FRAZIER TIP 10 FR DISP (SUCTIONS) ×2 IMPLANT
SUT ETHILON 4 0 PS 2 18 (SUTURE) IMPLANT
SUT ETHILON 5 0 P 3 18 (SUTURE) ×1
SUT NYLON ETHILON 5-0 P-3 1X18 (SUTURE) ×1 IMPLANT
SUT PROLENE 3 0 PS 2 (SUTURE) ×2 IMPLANT
SUT PROLENE 4 0 PS 2 18 (SUTURE) ×1 IMPLANT
SWAB COLLECTION DEVICE MRSA (MISCELLANEOUS) ×1 IMPLANT
SYR CONTROL 10ML LL (SYRINGE) IMPLANT
TOWEL OR 17X24 6PK STRL BLUE (TOWEL DISPOSABLE) ×2 IMPLANT
TOWEL OR 17X26 10 PK STRL BLUE (TOWEL DISPOSABLE) ×2 IMPLANT
TUBE ANAEROBIC SPECIMEN COL (MISCELLANEOUS) ×1 IMPLANT
TUBE CONNECTING 12X1/4 (SUCTIONS) ×2 IMPLANT
TUBING CYSTO DISP (UROLOGICAL SUPPLIES) ×1 IMPLANT
UNDERPAD 30X30 INCONTINENT (UNDERPADS AND DIAPERS) ×2 IMPLANT
WATER STERILE IRR 1000ML POUR (IV SOLUTION) ×2 IMPLANT
YANKAUER SUCT BULB TIP NO VENT (SUCTIONS) ×2 IMPLANT

## 2012-06-09 NOTE — Consult Note (Signed)
Reason for Consult:left hand infection Referring Physician: Dr. Merrilee Jansky Bryan Powell is an 61 y.o. male.  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 Reflux   . Lower extremity venous stasis   . Genital herpes   . GERD (gastroesophageal reflux disease)   . Hep C w/o coma, chronic     Past Surgical History  Procedure Date  . Tonsillectomy   . Orbital fracture surgery   . Cholecystectomy     No family history on file.  Social History:  reports that he has quit smoking. He has never used smokeless tobacco. He reports that he uses illicit drugs (IV). He reports that he does not drink alcohol.  Allergies: No Known Allergies  Medications: I have reviewed the patient's current medications.  Results for orders placed during the hospital encounter of 06/09/12 (from the past 48 hour(s))  CBC WITH DIFFERENTIAL     Status: Abnormal   Collection Time   06/09/12  4:12 PM      Component Value Range Comment   WBC 10.4  4.0 - 10.5 K/uL    RBC 5.30  4.22 - 5.81 MIL/uL    Hemoglobin 14.7  13.0 - 17.0 g/dL    HCT 57.8  46.9 - 62.9 %    MCV 83.4  78.0 - 100.0 fL    MCH 27.7  26.0 - 34.0 pg    MCHC 33.3  30.0 - 36.0 g/dL    RDW 52.8  41.3 - 24.4 %    Platelets 132 (*) 150 - 400 K/uL    Neutrophils Relative 59  43 - 77 %    Neutro Abs 6.2  1.7 - 7.7 K/uL    Lymphocytes Relative 30  12 - 46 %    Lymphs Abs 3.1  0.7 - 4.0 K/uL    Monocytes Relative 10  3 - 12 %    Monocytes Absolute 1.0  0.1 - 1.0 K/uL    Eosinophils Relative 2  0 - 5 %    Eosinophils Absolute 0.2  0.0 - 0.7 K/uL    Basophils Relative 0  0 - 1 %    Basophils Absolute 0.0  0.0 - 0.1 K/uL   COMPREHENSIVE METABOLIC PANEL     Status: Abnormal   Collection Time   06/09/12  4:12 PM      Component Value Range Comment   Sodium 133 (*)  135 - 145 mEq/L    Potassium 4.0  3.5 - 5.1 mEq/L    Chloride 97  96 - 112 mEq/L    CO2 28  19 - 32 mEq/L    Glucose, Bld 86  70 - 99 mg/dL    BUN 9  6 - 23 mg/dL    Creatinine, Ser 0.10  0.50 - 1.35 mg/dL    Calcium 9.5  8.4 - 27.2 mg/dL    Total Protein 7.7  6.0 - 8.3 g/dL    Albumin 3.8  3.5 - 5.2 g/dL    AST 30  0 - 37 U/L    ALT 30  0 - 53 U/L    Alkaline Phosphatase 70  39 - 117 U/L    Total Bilirubin 1.0  0.3 - 1.2 mg/dL    GFR calc non Af Amer >90  >90 mL/min  GFR calc Af Amer >90  >90 mL/min     Dg Hand Complete Left  06/09/2012  *RADIOLOGY REPORT*  Clinical Data: Extremity pain, arm swelling.  Bruising for 2 days. Redness radiates of the arm.  LEFT HAND - COMPLETE 3+ VIEW  Comparison: None.  Findings: There is diffuse soft tissue swelling of the hand.  There is no evidence for acute fracture or subluxation.  No radiopaque foreign body or soft tissue gas identified.  Mild degenerative changes are present.  IMPRESSION:  1.  Significant soft tissue swelling. 2. No evidence for acute osseous abnormality.   Original Report Authenticated By: Norva Pavlov, M.D.     No recent illnesses or hospitalizations  Blood pressure 112/67, pulse 105, temperature 97.9 F (36.6 C), temperature source Oral, resp. rate 17, height 5\' 11"  (1.803 m), weight 79.379 kg (175 lb), SpO2 95.00%. Left hand: very swollen, left long finger with sausage digit, pain with passive motion, limited long finger motion Pain with passive extension. Held in flexion, +kanaval signs. Limited mobility to index/long/small Ascending erythema and lymphangitis No open wounds Finger tips perfused Lacks full mobility   Assessment/Plan: Left long finger purulent tenosynovitis flexor Left hand iv drug use injection injury Active heroin use/dependence Hepatitis C  Will plan for urgent incision and drainage of left hand and long finger. Pt with infection that needs formal I/D. Discussed surgery with patient R/B/A  DISCUSSED WITH PT IN ED.  PT VOICED UNDERSTANDING OF PLAN CONSENT SIGNED DAY OF SURGERY PT SEEN AND EXAMINED PRIOR TO OPERATIVE PROCEDURE/DAY OF SURGERY SITE MARKED. QUESTIONS ANSWERED  WILL BE ADMITTED TO INTERNAL MEDICINE FOLLOWING SURGERY  I SPOKE WITH PATIENT ABOUT GRAVITY OF INFECTION AND POTENTIAL LOSS OF FINGER IF INFECTION PERSISTS WILL LIKELY NEED REPEAT I/D OF FINGER TO TRY AND SAVE FINGER FROM LOSS  Sharma Covert 06/09/2012, 8:40 PM

## 2012-06-09 NOTE — ED Notes (Signed)
Patient with swelling to the left hand and digits with some darkening noted.  Patient states he is a iv drug user and had injected himself between the 3 and 4th digit.  There is also redness going up the forearm.  Patient is also here due to concerns of pain in his lower legs,  He has hx of cellulitis

## 2012-06-09 NOTE — H&P (Signed)
[facility] History and Physical  Bryan Powell UJW:[id number] DOB: 09-10-1950 DOA: 06/09/2012  Referring physician: ED PCP: Elby Showers, MD  Specialists: Dr. [facility]  Chief Complaint: L hand long finger infection  HPI: Bryan Powell is a 61 y.o. male R hand dominant IVDU injecting heroin into his L hand.  Unfortunately his last use 1.5 days ago lead to an infection of the long finger of his L hand.  The finger is painful, very swollen, and suspected of having an abscess.  Associated redness and warmth in the arm.  Orthopaedic surgery was consulted and hospitalist has been asked to admit the patient.  Patient reports he uses about 1 bundle of heroin a day at this point, was clean for 5 years but started back a year ago.  In the ED there were no SIRS criteria and workup otherwise was unremarkable.  Review of Systems: 12 systems reviewed and negative.  Past Medical History  Diagnosis Date  . Reflux   . Lower extremity venous stasis   . Genital herpes   . GERD (gastroesophageal reflux disease)   . Hep C w/o coma, chronic    Past Surgical History  Procedure Date  . Tonsillectomy   . Orbital fracture surgery   . Cholecystectomy    Social History:  reports that he has quit smoking. He has never used smokeless tobacco. He reports that he uses illicit drugs (IV and Heroin). He reports that he does not drink alcohol.   No Known Allergies  No family history on file.  Prior to Admission medications   Medication Sig Start Date End Date Taking? Authorizing Provider  cyclobenzaprine (FLEXERIL) 10 MG tablet Take 1 tablet (10 mg total) by mouth at bedtime as needed. For muscle spasms. 10/14/11  Yes Adeline Joselyn Glassman, MD  esomeprazole (NEXIUM) 40 MG capsule Take 1 capsule (40 mg total) by mouth daily before breakfast. 10/14/11  Yes Adeline C Viyuoh, MD  LORazepam (ATIVAN) 0.5 MG tablet Take 1 tablet (0.5 mg total) by mouth 2 (two) times daily as needed. For  anxiety. 10/14/11  Yes Adeline Joselyn Glassman, MD  methadone (DOLOPHINE) 10 MG tablet Take 30 mg by mouth 2 (two) times daily.   Yes Historical Provider, MD  Multiple Vitamin (MULTIVITAMIN WITH MINERALS) TABS Take 1 tablet by mouth daily.   Yes Historical Provider, MD  oxyCODONE (OXYCONTIN) 10 MG 12 hr tablet Take 1-2 tablets (10-20 mg total) by mouth every 12 (twelve) hours. For pain. 10/14/11  Yes Adeline Joselyn Glassman, MD  senna (SENOKOT) 8.6 MG TABS Take 1 tablet (8.6 mg total) by mouth 2 (two) times daily. 10/14/11  Yes Adeline C Viyuoh, MD  traMADol (ULTRAM) 50 MG tablet Take 1 tablet (50 mg total) by mouth every 6 (six) hours as needed. 10/14/11  Yes Adeline Joselyn Glassman, MD  traZODone (DESYREL) 100 MG tablet Take 100 mg by mouth at bedtime.   Yes Historical Provider, MD  valACYclovir (VALTREX) 500 MG tablet Take 1 tablet (500 mg total) by mouth 2 (two) times daily. 10/14/11  Yes Kela Millin, MD   Physical Exam: Filed Vitals:   06/09/12 1420 06/09/12 1426 06/09/12 1605  BP: 122/76  112/67  Pulse: 97  105  Temp: 98.4 F (36.9 C)  97.9 F (36.6 C)  TempSrc: Oral  Oral  Resp: 20  17  Height:  5\' 11"  (1.803 m)   Weight:  79.379 kg (175 lb)   SpO2: 94%  95%    General:  NAD, resting  comfortably in bed Eyes: PEERLA EOMI ENT: mucous membranes moist Neck: supple w/o JVD Cardiovascular: RRR w/o MRG Respiratory: CTA B Abdomen: soft, nt, nd, bs+ Skin: no edema, rash as described below Musculoskeletal: significant edema to the L hand and especially the middle finger, ecchymosis and erythema noted, areas of erythema on left forearm, purulent discharge noted in between 3rd and 4th didgets. Psychiatric: normal tone and affect Neurologic: AAOx3, grossly non-focal   Labs on Admission:  Basic Metabolic Panel:  Lab 06/09/12 6962  NA 133*  K 4.0  CL 97  CO2 28  GLUCOSE 86  BUN 9  CREATININE 0.62  CALCIUM 9.5  MG --  PHOS --   Liver Function Tests:  Lab 06/09/12 1612  AST 30  ALT 30  ALKPHOS  70  BILITOT 1.0  PROT 7.7  ALBUMIN 3.8   No results found for this basename: LIPASE:5,AMYLASE:5 in the last 168 hours No results found for this basename: AMMONIA:5 in the last 168 hours CBC:  Lab 06/09/12 1612  WBC 10.4  NEUTROABS 6.2  HGB 14.7  HCT 44.2  MCV 83.4  PLT 132*   Cardiac Enzymes: No results found for this basename: CKTOTAL:5,CKMB:5,CKMBINDEX:5,TROPONINI:5 in the last 168 hours  BNP (last 3 results)  Basename 10/13/11 0650  PROBNP 62.5   CBG: No results found for this basename: GLUCAP:5 in the last 168 hours  Radiological Exams on Admission: Dg Hand Complete Left  06/09/2012  *RADIOLOGY REPORT*  Clinical Data: Extremity pain, arm swelling.  Bruising for 2 days. Redness radiates of the arm.  LEFT HAND - COMPLETE 3+ VIEW  Comparison: None.  Findings: There is diffuse soft tissue swelling of the hand.  There is no evidence for acute fracture or subluxation.  No radiopaque foreign body or soft tissue gas identified.  Mild degenerative changes are present.  IMPRESSION:  1.  Significant soft tissue swelling. 2. No evidence for acute osseous abnormality.   Original Report Authenticated By: Norva Pavlov, M.D.     EKG: Independently reviewed.  Assessment/Plan Principal Problem:  *Finger infection Active Problems:  HEROIN ABUSE   1. Finger infection - patient on vancomycin, surgical cultures ordered, will admit, ortho taking patient to OR for I+D, pain control will be a big issue post operatively, with the patient on a bundle of heroin a day at home for the past year, even high dose narcotics are likely to have only minimal effect.  ED is addressing the need for IV access at this time. 2. Heroin Abuse - having pharmacy identify which if any of his home narcotic medications are actually being prescribed / filled at this time, best strategy will likely be to treat pain post operatively initially at least with a single medication (ie dilaudid) for his pain, and hold his  home medications.    Code Status: Full Code (must indicate code status--if unknown or must be presumed, indicate so) Family Communication: spoke with patient and wife at bedside (indicate person spoken with, if applicable, with phone number if by telephone) Disposition Plan: Admit to inpatient (indicate anticipated LOS)  Time spent: 70 min  GARDNER, JARED M. [facility] Pager [phone]  If 7PM-7AM, please contact night-coverage [email or web address] Password [facility] 06/09/2012, 10:21 PM

## 2012-06-09 NOTE — Transfer of Care (Signed)
Immediate Anesthesia Transfer of Care Note  Patient: Bryan Powell  Procedure(s) Performed: Procedure(s) (LRB) with comments: IRRIGATION AND DEBRIDEMENT EXTREMITY (Left)  Patient Location: PACU  Anesthesia Type:General  Level of Consciousness: awake and alert   Airway & Oxygen Therapy: Patient Spontanous Breathing and Patient connected to face mask oxygen  Post-op Assessment: Report given to PACU RN and Post -op Vital signs reviewed and stable  Post vital signs: Reviewed and stable  Complications: No apparent anesthesia complications

## 2012-06-09 NOTE — Brief Op Note (Signed)
06/09/2012  8:47 PM  PATIENT:  Bryan Powell  61 y.o. male  PRE-OPERATIVE DIAGNOSIS:  Left Hand infection  POST-OPERATIVE DIAGNOSIS:  Left hand and long finger infection  PROCEDURE:  Procedure(s) (LRB) with comments: IRRIGATION AND DEBRIDEMENT EXTREMITY (Left) Left long finger flexor sheath drainage  SURGEON:  Surgeon(s) and Role:    * Sharma Covert, MD - Primary  PHYSICIAN ASSISTANT: none  ASSISTANTS: none   ANESTHESIA:   general  EBL:   none  BLOOD ADMINISTERED:none  DRAINS: none   LOCAL MEDICATIONS USED:  NONE  SPECIMEN:  No Specimen  DISPOSITION OF SPECIMEN:  N/A  COUNTS:  YES  TOURNIQUET:  * No tourniquets in log *  DICTATION: .[id number]  PLAN OF CARE: Admit to inpatient   PATIENT DISPOSITION:  PACU - hemodynamically stable.   Delay start of Pharmacological VTE agent (>24hrs) due to surgical blood loss or risk of bleeding: not applicable

## 2012-06-09 NOTE — Anesthesia Postprocedure Evaluation (Signed)
  Anesthesia Post-op Note  Patient: Bryan Powell  Procedure(s) Performed: Procedure(s) (LRB) with comments: IRRIGATION AND DEBRIDEMENT EXTREMITY (Left)  Patient Location: PACU  Anesthesia Type:General  Level of Consciousness: awake and alert   Airway and Oxygen Therapy: Patient Spontanous Breathing  Post-op Pain: mild  Post-op Assessment: Post-op Vital signs reviewed, Patient's Cardiovascular Status Stable, Respiratory Function Stable, Patent Airway, No signs of Nausea or vomiting and Pain level controlled  Post-op Vital Signs: stable  Complications: No apparent anesthesia complications

## 2012-06-09 NOTE — Anesthesia Procedure Notes (Signed)
Procedure Name: Intubation Date/Time: 06/09/2012 9:47 PM Performed by: Garen Lah Pre-anesthesia Checklist: Patient identified, Timeout performed, Emergency Drugs available, Suction available and Patient being monitored Patient Re-evaluated:Patient Re-evaluated prior to inductionOxygen Delivery Method: Circle system utilized Preoxygenation: Pre-oxygenation with 100% oxygen Intubation Type: IV induction, Rapid sequence and Cricoid Pressure applied Laryngoscope Size: Mac and 3 Grade View: Grade I Tube type: Oral Tube size: 8.0 mm Number of attempts: 1 Airway Equipment and Method: Stylet Placement Confirmation: ETT inserted through vocal cords under direct vision,  positive ETCO2 and breath sounds checked- equal and bilateral Secured at: 21 cm Tube secured with: Tape Dental Injury: Teeth and Oropharynx as per pre-operative assessment

## 2012-06-09 NOTE — Preoperative (Signed)
Beta Blockers   Reason not to administer Beta Blockers:Not Applicable. No home beta blockers 

## 2012-06-09 NOTE — ED Provider Notes (Signed)
History   This chart was scribed for [employer]. [name], MD by Charolett Bumpers, ED Scribe. The patient was seen in room TR07C/TR07C. Patient's care was started at 1741.   CSN: [id number]  Arrival date & time 06/09/12  1406   First MD Initiated Contact with Patient 06/09/12 1741      Chief Complaint  Patient presents with  . Extremity Pain  . Arm Swelling    The history is provided by the patient. No language interpreter was used.  Bryan Powell is a 61 y.o. male who presents to the Emergency Department complaining of constant, gradually worsening left hand swelling that started last night. He reports that he injected heroine yesterday, started swelling last night. He reports associated redness and warmth to his arm. He denies any fevers. He reports he also has a fungal infection in the webspace of his fingers, but injected around those areas. He also has chronic venous stasis in lower extremities. He reports he is having pain in his lower legs and has had cellulitis in the past. He states that he uses a bundle daily of heroine. He has a h/o hepatitis C. He states that he was clean for 5 years, but started back a year ago.   PCP: Dr. Shary Decamp  Past Medical History  Diagnosis Date  . Reflux   . Lower extremity venous stasis   . Genital herpes   . GERD (gastroesophageal reflux disease)   . Hep C w/o coma, chronic     Past Surgical History  Procedure Date  . Tonsillectomy   . Orbital fracture surgery   . Cholecystectomy     No family history on file.  History  Substance Use Topics  . Smoking status: Former Games developer  . Smokeless tobacco: Never Used  . Alcohol Use: No      Review of Systems  Constitutional: Negative for fever.  Musculoskeletal: Positive for joint swelling and arthralgias.  Skin: Positive for color change.  All other systems reviewed and are negative.    Allergies  Review of patient's allergies indicates no known allergies.  Home  Medications   Current Outpatient Rx  Name  Route  Sig  Dispense  Refill  . CYCLOBENZAPRINE HCL 10 MG PO TABS   Oral   Take 1 tablet (10 mg total) by mouth at bedtime as needed. For muscle spasms.   10 tablet   0   . ESOMEPRAZOLE MAGNESIUM 40 MG PO CPDR   Oral   Take 1 capsule (40 mg total) by mouth daily before breakfast.   30 capsule   0   . LORAZEPAM 0.5 MG PO TABS   Oral   Take 1 tablet (0.5 mg total) by mouth 2 (two) times daily as needed. For anxiety.   20 tablet   0   . METHADONE HCL 10 MG PO TABS   Oral   Take 30 mg by mouth 2 (two) times daily.         . ADULT MULTIVITAMIN W/MINERALS CH   Oral   Take 1 tablet by mouth daily.         . OXYCODONE HCL ER 10 MG PO TB12   Oral   Take 1-2 tablets (10-20 mg total) by mouth every 12 (twelve) hours. For pain.   30 tablet   0   . SENNA 8.6 MG PO TABS   Oral   Take 1 tablet (8.6 mg total) by mouth 2 (two) times daily.   60 each  0   . TRAMADOL HCL 50 MG PO TABS   Oral   Take 1 tablet (50 mg total) by mouth every 6 (six) hours as needed.   20 tablet   0   . TRAZODONE HCL 100 MG PO TABS   Oral   Take 100 mg by mouth at bedtime.         Marland Kitchen VALACYCLOVIR HCL 500 MG PO TABS   Oral   Take 1 tablet (500 mg total) by mouth 2 (two) times daily.   20 tablet   0     BP 112/67  Pulse 105  Temp 97.9 F (36.6 C) (Oral)  Resp 17  Ht 5\' 11"  (1.803 m)  Wt 175 lb (79.379 kg)  BMI 24.41 kg/m2  SpO2 95%  Physical Exam  Nursing note and vitals reviewed. Constitutional: He is oriented to person, place, and time. He appears well-developed and well-nourished. No distress.  HENT:  Head: Normocephalic and atraumatic.  Eyes: EOM are normal.  Neck: Neck supple. No tracheal deviation present.  Cardiovascular: Normal rate.   Pulmonary/Chest: Effort normal. No respiratory distress.  Musculoskeletal: He exhibits edema and tenderness.       Significant edema to the left hand, worse around the middle finger. Ecchymosis  and erythema noted. Good capillary refill. Areas of erythema on left forearm. He has decreased movement but is able to extend/flex at MTI and IP joints. Whitish discharge noted in between 3rd and 4th digits on left hand.   Neurological: He is alert and oriented to person, place, and time.  Skin: Skin is warm and dry. There is erythema.  Psychiatric: He has a normal mood and affect. His behavior is normal.    ED Course  Procedures (including critical care time)  COORDINATION OF CARE:  17:50-Discussed planned course of treatment with the patient, who is agreeable at this time.   Results for orders placed during the hospital encounter of 06/09/12  CBC WITH DIFFERENTIAL      Component Value Range   WBC 10.4  4.0 - 10.5 K/uL   RBC 5.30  4.22 - 5.81 MIL/uL   Hemoglobin 14.7  13.0 - 17.0 g/dL   HCT 16.1  09.6 - 04.5 %   MCV 83.4  78.0 - 100.0 fL   MCH 27.7  26.0 - 34.0 pg   MCHC 33.3  30.0 - 36.0 g/dL   RDW 40.9  81.1 - 91.4 %   Platelets 132 (*) 150 - 400 K/uL   Neutrophils Relative 59  43 - 77 %   Neutro Abs 6.2  1.7 - 7.7 K/uL   Lymphocytes Relative 30  12 - 46 %   Lymphs Abs 3.1  0.7 - 4.0 K/uL   Monocytes Relative 10  3 - 12 %   Monocytes Absolute 1.0  0.1 - 1.0 K/uL   Eosinophils Relative 2  0 - 5 %   Eosinophils Absolute 0.2  0.0 - 0.7 K/uL   Basophils Relative 0  0 - 1 %   Basophils Absolute 0.0  0.0 - 0.1 K/uL  COMPREHENSIVE METABOLIC PANEL      Component Value Range   Sodium 133 (*) 135 - 145 mEq/L   Potassium 4.0  3.5 - 5.1 mEq/L   Chloride 97  96 - 112 mEq/L   CO2 28  19 - 32 mEq/L   Glucose, Bld 86  70 - 99 mg/dL   BUN 9  6 - 23 mg/dL   Creatinine, Ser 7.82  0.50 -  1.35 mg/dL   Calcium 9.5  8.4 - 45.4 mg/dL   Total Protein 7.7  6.0 - 8.3 g/dL   Albumin 3.8  3.5 - 5.2 g/dL   AST 30  0 - 37 U/L   ALT 30  0 - 53 U/L   Alkaline Phosphatase 70  39 - 117 U/L   Total Bilirubin 1.0  0.3 - 1.2 mg/dL   GFR calc non Af Amer >90  >90 mL/min   GFR calc Af Amer >90  >90  mL/min    Dg Hand Complete Left  06/09/2012  *RADIOLOGY REPORT*  Clinical Data: Extremity pain, arm swelling.  Bruising for 2 days. Redness radiates of the arm.  LEFT HAND - COMPLETE 3+ VIEW  Comparison: None.  Findings: There is diffuse soft tissue swelling of the hand.  There is no evidence for acute fracture or subluxation.  No radiopaque foreign body or soft tissue gas identified.  Mild degenerative changes are present.  IMPRESSION:  1.  Significant soft tissue swelling. 2. No evidence for acute osseous abnormality.   Original Report Authenticated By: Norva Pavlov, M.D.      1. Cellulitis of hand       MDM  Patient with cellulitis and possible abscess hand. Seen in ER by hand surgery and internal medicine. Will be admitted to internal medicine.    I personally performed the services described in this documentation, which was scribed in my presence. The recorded information has been reviewed and is accurate.       Juliet Rude. [name], MD 06/09/12 2037

## 2012-06-09 NOTE — Progress Notes (Signed)
ANTIBIOTIC CONSULT NOTE - INITIAL  Pharmacy Consult for vanc Indication: hand infection  No Known Allergies  Patient Measurements: Height: 5\' 11"  (180.3 cm) Weight: 175 lb (79.379 kg) IBW/kg (Calculated) : 75.3   Vital Signs: Temp: 97.9 F (36.6 C) (12/28 1605) Temp src: Oral (12/28 1605) BP: 112/67 mmHg (12/28 1605) Pulse Rate: 105  (12/28 1605) Intake/Output from previous day:   Intake/Output from this shift:    Labs:  [facility] 06/09/12 1612  WBC 10.4  HGB 14.7  PLT 132*  LABCREA --  CREATININE 0.62   Estimated Creatinine Clearance: 102 ml/min (by C-G formula based on Cr of 0.62). No results found for this basename: VANCOTROUGH:2,VANCOPEAK:2,VANCORANDOM:2,GENTTROUGH:2,GENTPEAK:2,GENTRANDOM:2,TOBRATROUGH:2,TOBRAPEAK:2,TOBRARND:2,AMIKACINPEAK:2,AMIKACINTROU:2,AMIKACIN:2, in the last 72 hours   Microbiology: No results found for this or any previous visit (from the past 720 hour(s)).  Medical History: Past Medical History  Diagnosis Date  . Reflux   . Lower extremity venous stasis   . Genital herpes   . GERD (gastroesophageal reflux disease)   . Hep C w/o coma, chronic     Medications:  Scheduled:    . vancomycin  1,000 mg Intravenous Once   Assessment: 61 yo IVDA who was admitted today for likely hand cellulitis. He has a hx of leg cellulitis in the past. He reported of injecting drugs between his 3rd and 4th digits.   Goal of Therapy:  Vancomycin trough level 10-15 mcg/ml  Plan:  Vanc 1.25g IV q12 F/u with trough if needed  Clide Cliff 06/09/2012,8:26 PM

## 2012-06-09 NOTE — Anesthesia Preprocedure Evaluation (Addendum)
Anesthesia Evaluation  Patient identified by MRN, date of birth, ID band Patient awake    Reviewed: Allergy & Precautions, H&P , NPO status , Patient's Chart, lab work & pertinent test results, reviewed documented beta blocker date and time   Airway Mallampati: I TM Distance: >3 FB Neck ROM: Full    Dental  (+) Partial Lower   Pulmonary COPDformer smoker,  + rhonchi         Cardiovascular Rhythm:Regular Rate:Normal     Neuro/Psych Anxiety    GI/Hepatic GERD-  ,(+)     substance abuse  IV drug use, Hepatitis -, C  Endo/Other    Renal/GU    Genital herpes    Musculoskeletal   Abdominal   Peds  Hematology   Anesthesia Other Findings   Reproductive/Obstetrics                          Anesthesia Physical Anesthesia Plan  ASA: III and emergent  Anesthesia Plan: General   Post-op Pain Management:    Induction: Intravenous, Rapid sequence and Cricoid pressure planned  Airway Management Planned: Oral ETT  Additional Equipment:   Intra-op Plan:   Post-operative Plan: Extubation in OR  Informed Consent: I have reviewed the patients History and Physical, chart, labs and discussed the procedure including the risks, benefits and alternatives for the proposed anesthesia with the patient or authorized representative who has indicated his/her understanding and acceptance.   Dental advisory given  Plan Discussed with: CRNA and Surgeon  Anesthesia Plan Comments:       Anesthesia Quick Evaluation

## 2012-06-10 LAB — CBC
HCT: 37.3 % — ABNORMAL LOW (ref 39.0–52.0)
MCH: 26.7 pg (ref 26.0–34.0)
MCHC: 31.9 g/dL (ref 30.0–36.0)
RDW: 13.6 % (ref 11.5–15.5)

## 2012-06-10 LAB — BASIC METABOLIC PANEL
BUN: 7 mg/dL (ref 6–23)
Calcium: 8.7 mg/dL (ref 8.4–10.5)
Creatinine, Ser: 0.53 mg/dL (ref 0.50–1.35)
GFR calc Af Amer: >90 mL/min (ref >90)
GFR calc non Af Amer: >90 mL/min (ref >90)
Potassium: 3.6 mEq/L (ref 3.5–5.1)

## 2012-06-10 LAB — GLUCOSE, CAPILLARY: Glucose-Capillary: 163 mg/dL — ABNORMAL HIGH (ref 70–99)

## 2012-06-10 MED ORDER — KETOROLAC TROMETHAMINE 30 MG/ML IJ SOLN
30.0000 mg | Freq: Four times a day (QID) | INTRAMUSCULAR | Status: DC | PRN
  Administered 2012-06-10: 30 mg via INTRAVENOUS
  Filled 2012-06-10: qty 1

## 2012-06-10 MED ORDER — TRAMADOL HCL 50 MG PO TABS
100.0000 mg | ORAL_TABLET | Freq: Three times a day (TID) | ORAL | Status: DC
  Administered 2012-06-10 – 2012-06-12 (×7): 100 mg via ORAL
  Filled 2012-06-10 (×9): qty 2

## 2012-06-10 MED ORDER — ACETAMINOPHEN 325 MG PO TABS
650.0000 mg | ORAL_TABLET | Freq: Four times a day (QID) | ORAL | Status: DC | PRN
  Administered 2012-06-10: 650 mg via ORAL

## 2012-06-10 MED ORDER — HYDROMORPHONE HCL PF 1 MG/ML IJ SOLN
2.0000 mg | INTRAMUSCULAR | Status: DC | PRN
  Administered 2012-06-10 (×8): 2 mg via INTRAVENOUS
  Filled 2012-06-10 (×6): qty 2
  Filled 2012-06-10: qty 1
  Filled 2012-06-10: qty 2
  Filled 2012-06-10: qty 1

## 2012-06-10 MED ORDER — OXYCODONE HCL ER 15 MG PO T12A
15.0000 mg | EXTENDED_RELEASE_TABLET | Freq: Two times a day (BID) | ORAL | Status: DC
  Administered 2012-06-10 – 2012-06-12 (×5): 15 mg via ORAL
  Filled 2012-06-10 (×5): qty 1

## 2012-06-10 MED ORDER — OXYCODONE HCL 5 MG/5ML PO SOLN
5.0000 mg | ORAL | Status: DC | PRN
  Administered 2012-06-10 – 2012-06-12 (×10): 5 mg via ORAL
  Filled 2012-06-10 (×10): qty 5

## 2012-06-10 MED ORDER — INFLUENZA VIRUS VACC SPLIT PF IM SUSP
0.5000 mL | Freq: Once | INTRAMUSCULAR | Status: AC
  Administered 2012-06-10: 0.5 mL via INTRAMUSCULAR
  Filled 2012-06-10: qty 0.5

## 2012-06-10 NOTE — Progress Notes (Signed)
md notified of no iv  And poor venous access , orders received ,

## 2012-06-10 NOTE — Progress Notes (Signed)
[facility] PROGRESS NOTE  Bryan Powell ZOX:[id number] DOB: 1950/11/07 DOA: 06/09/2012 PCP: Elby Showers, MD  Assessment/Plan: Principal Problem:  *Finger infection Active Problems:  HEROIN ABUSE  1. Finger infection - patient on vancomycin, surgical cultures ordered, will admit, status post I&D, pain control will be a big issue post operatively, with the patient on a bundle of heroin a day at home for the past year, even high dose narcotics are likely to have only minimal effect. Continue to follow cultures 2. Heroin Abuse - having pharmacy identify which if any of his home narcotic medications are actually being prescribed / filled at this time, best strategy will likely be to treat pain post operatively initially at least with a single medication (ie dilaudid) for his pain, and hold his home medications. 3.   Code Status: full Family Communication: family updated about patient's clinical progress Disposition Plan:  As above    Brief narrative: Bryan Powell is a 61 y.o. male R hand dominant IVDU injecting heroin into his L hand. Unfortunately his last use 1.5 days ago lead to an infection of the long finger of his L hand. The finger is painful, very swollen, and suspected of having an abscess. Associated redness and warmth in the arm. Orthopaedic surgery was consulted and hospitalist has been asked to admit the patient.  Patient reports he uses about 1 bundle of heroin a day at this point, was clean for 5 years but started back a year ago. In the ED there were no SIRS criteria and workup otherwise was unremarkable.   Consultants: Sharma Covert, MD   Procedures: SURGICAL PROCEDURES:  1. Left long finger flexor sheath drainage.  2. Incision and drainage of dorsum of the hand.   Antibiotics:  Vancomycin started 12/28  HPI/Subjective: Stable overnight  Objective: Filed Vitals:   06/09/12 2315 06/09/12 2322 06/10/12 0005 06/10/12 0602  BP: 169/92 169/92  145/79 151/87  Pulse: 109  94 79  Temp: 97.5 F (36.4 C)  98.8 F (37.1 C) 98.2 F (36.8 C)  TempSrc:   Oral   Resp: 24 25 19 18   Height:      Weight:      SpO2: 96%  94% 92%    Intake/Output Summary (Last 24 hours) at 06/10/12 0827 Last data filed at 06/10/12 0500  Gross per 24 hour  Intake   1400 ml  Output    700 ml  Net    700 ml    Exam:  General: NAD, resting comfortably in bed Eyes: PEERLA EOMI ENT: mucous membranes moist Neck: supple w/o JVD Cardiovascular: RRR w/o MRG Respiratory: CTA B Abdomen: soft, nt, nd, bs+ Skin: no edema, rash as described below Musculoskeletal: significant edema to the L hand and especially the middle finger, ecchymosis and erythema noted, areas of erythema on left forearm, purulent discharge noted in between 3rd and 4th didgets. Psychiatric: normal tone and affect Neurologic: AAOx3, grossly non-focal    Data Reviewed: Basic Metabolic Panel:  Lab 06/10/12 8119 06/09/12 1612  NA 136 133*  K 3.6 4.0  CL 99 97  CO2 25 28  GLUCOSE 120* 86  BUN 7 9  CREATININE 0.53 0.62  CALCIUM 8.7 9.5  MG -- --  PHOS -- --    Liver Function Tests:  Lab 06/09/12 1612  AST 30  ALT 30  ALKPHOS 70  BILITOT 1.0  PROT 7.7  ALBUMIN 3.8   No results found for this basename: LIPASE:5,AMYLASE:5 in the last 168 hours No  results found for this basename: AMMONIA:5 in the last 168 hours  CBC:  Lab 06/10/12 0600 06/09/12 1612  WBC 6.5 10.4  NEUTROABS -- 6.2  HGB 11.9* 14.7  HCT 37.3* 44.2  MCV 83.6 83.4  PLT 117* 132*    Cardiac Enzymes: No results found for this basename: CKTOTAL:5,CKMB:5,CKMBINDEX:5,TROPONINI:5 in the last 168 hours BNP (last 3 results)  Basename 10/13/11 0650  PROBNP 62.5     CBG:  Lab 06/10/12 0739  GLUCAP 163*    No results found for this or any previous visit (from the past 240 hour(s)).   Studies: Dg Hand Complete Left  06/09/2012  *RADIOLOGY REPORT*  Clinical Data: Extremity pain, arm swelling.  Bruising  for 2 days. Redness radiates of the arm.  LEFT HAND - COMPLETE 3+ VIEW  Comparison: None.  Findings: There is diffuse soft tissue swelling of the hand.  There is no evidence for acute fracture or subluxation.  No radiopaque foreign body or soft tissue gas identified.  Mild degenerative changes are present.  IMPRESSION:  1.  Significant soft tissue swelling. 2. No evidence for acute osseous abnormality.   Original Report Authenticated By: Norva Pavlov, M.D.     Scheduled Meds:   . influenza  inactive virus vaccine  0.5 mL Intramuscular Once  . multivitamin with minerals  1 tablet Oral Daily  . pantoprazole  80 mg Oral Q1200  . senna  1 tablet Oral BID  . sodium chloride  500 mL Intravenous Once  . traZODone  100 mg Oral QHS  . valACYclovir  500 mg Oral BID  . vancomycin  1,250 mg Intravenous Q12H   Continuous Infusions:   Principal Problem:  *Finger infection Active Problems:  HEROIN ABUSE    Time spent: 40 minutes   [facility]  [facility] Pager [phone]. If 8PM-8AM, please contact night-coverage at [email or web address], password [facility] 06/10/2012, 8:27 AM  LOS: 1 day

## 2012-06-10 NOTE — Progress Notes (Signed)
ASKED PER STAFF RN TO ASSESS PT'S LEFT EJ, PT C/O PAIN TO SITE. ON ASSESSMENT EJ WAS FOUND BENT AND PULLING DOWN, ONCE PIV WAS HELD BACK IN PROPER POSITION IT WAS NOTED THAT SITE WAS STARTING INFILTRATE, EXPLAINED TO PT THAT PIV EJ WOULD HAVE TO BE REMOVED, EXPLAIN RIGHT ARM WOULD NEED TO BE ASSESSED FOR NEW IV SITE, PT STATED HE DOES NOT HAVE ANY VEINS IN HIS ARMS THAT WAS WHY HE HAS PIV IN HIS NECK, EXPLAINED THIS TO STAFF RN AND INFORMED EJ NEEDS TO BE REMOVED, PT IS NOTED TO HAVE VERY POOR VENOUS ACCESS TO RIGHT ARM AND LEFT ARM IS BANDAGED, STAFF RN TO INFORM MD.

## 2012-06-10 NOTE — Op Note (Signed)
NAME:  Bryan Powell, Bryan Powell NO.:  [id number]  MEDICAL RECORD NO.:  [id number]  LOCATION:  [id number]                        FACILITY:  [facility]  PHYSICIAN:  Madelynn Done, MD  DATE OF BIRTH:  1951-06-11  DATE OF PROCEDURE:  06/09/2012 DATE OF DISCHARGE:                              OPERATIVE REPORT   PREOPERATIVE DIAGNOSES:  Left long finger flexor sheath infection.  Left hand dorsal hand infection.  POSTOPERATIVE DIAGNOSIS:  Left long finger flexor sheath infection. Left hand dorsal hand infection.  ATTENDING PHYSICIAN:  Madelynn Done, MD, who was scrubbed and present for the entire procedure.  ASSISTANT SURGEON:  None.  ANESTHESIA:  General via LMA.  SURGICAL PROCEDURES: 1. Left long finger flexor sheath drainage. 2. Incision and drainage of dorsum of the hand.  SURGICAL INDICATIONS:  Mr. Hollar is a 61 year old gentleman who was injecting intravenous heroin.  The patient was then with worsening pain and swelling to the long finger and the dorsum of his hand.  The patient was seen and evaluated in the emergency department and recommended to undergo the above procedure.  Risks, benefits, and alternatives were discussed in detail with the patient.  Signed informed consent was obtained.  Risks include, but not limited to bleeding; infection; damage to nearby nerves, arteries, or tendons; incomplete relief of symptoms; and need for further surgical intervention.  DESCRIPTION OF PROCEDURE:  The patient was properly identified in the preoperative holding area and marked with a permanent marker made on the left long finger to indicate the correct operative site.  The patient was then brought back to the operating room and placed supine on the anesthesia room table where the general anesthesia was administered. The patient tolerated this well.  The patient received an antibiotics after cultures were taken.  Left upper extremity was then prepped and draped in  normal sterile fashion.  Time-out was called.  The correct side was identified and procedure was then begun.  Attention was then turned to left long finger where Brunner incision was made directly in the long finger.  The limb was then elevated and tourniquet insufflated. Dissection was then carried down through the skin and subcutaneous tissues.  Gross purulence was then encountered along the ulnar aspect of the long finger at the PIP crease extending into the flexor sheath. Wound cultures were then taken.  Copious wound irrigation was done throughout the course of the flexor sheath.  The tendons were of good quality.  Excisional debridement was then carried out on the necrotic fat necrosis along the ulnar aspect of the finger.  After debridement and drainage of the flexor sheath, the wound was then copiously irrigated with 3 liters of solution.  It was then loosely reapproximated and closed over a small blue vessel loops with 3-0 and 4-0 Prolene suture.  Attention was then turned to the dorsal aspect of the hand based on the swelling in the injection and the redness.  Dorsally, a small longitudinal incision was made directly over the dorsal aspect of the hand.  Dissection was then carried down through the subcutaneous tissue.  The patient did have a lot of subcutaneous edema.  No frank purulence noted over the dorsum  of the hand.  The area was then thoroughly irrigated and then loosely reapproximated with simple Prolene sutures.  Adaptic dressing and sterile compressive bandage were then applied.  The patient tolerated the procedure well, was placed in a long- arm dorsal bulky hand splint.  The patient was then extubated and taken to recovery room in good condition.  POSTOPERATIVE PLAN:  The patient will be admitted to the Internal Medicine Service for IV antibiotics and care, and we will continue to follow him while he is in the hospital.  Wound check within the next 48 hours.  If his  finger looked better, we may not have to take him back for repeat I and D.  I was little bit surprised, thought there would be more of an infections, but may we got it at an early stages.  He did have the infection in the finger, but it was not as extended I thought it was going to be.  We will watch him closely.  Follow the wound cultures.     Madelynn Done, MD     FWO/MEDQ  D:  06/09/2012  T:  06/10/2012  Job:  [id number]

## 2012-06-10 NOTE — Progress Notes (Signed)
Subjective: PT C/O PAIN TO FINGER   Objective: Vital signs in last 24 hours: Temp:  [97.5 F (36.4 C)-98.8 F (37.1 C)] 98.2 F (36.8 C) (12/29 0602) Pulse Rate:  [79-109] 79  (12/29 0602) Resp:  [16-25] 18  (12/29 0602) BP: (112-169)/(67-92) 151/87 mmHg (12/29 0602) SpO2:  [92 %-98 %] 92 % (12/29 0602) FiO2 (%):  [28 %] 28 % (12/28 2253) Weight:  [79.379 kg (175 lb)] 79.379 kg (175 lb) (12/28 1426)  Intake/Output from previous day: 12/28 0701 - 12/29 0700 In: 1400 [I.V.:1400] Out: 700 [Urine:700] Intake/Output this shift:     Basename 06/10/12 0600 06/09/12 1612  HGB 11.9* 14.7    Basename 06/10/12 0600 06/09/12 1612  WBC 6.5 10.4  RBC 4.46 5.30  HCT 37.3* 44.2  PLT 117* 132*    Basename 06/10/12 0600 06/09/12 1612  NA 136 133*  K 3.6 4.0  CL 99 97  CO2 25 28  BUN 7 9  CREATININE 0.53 0.62  GLUCOSE 120* 86  CALCIUM 8.7 9.5   No results found for this basename: LABPT:2,INR:2 in the last 72 hours  LUE: SPLINT CLEAN/DRY INTACT FINGERS WARM WELL PERFUSED LIMITED DIGITAL MOBILITY  Assessment/Plan: LEFT HAND INFECTION HEROIN ABUSE  WILL PLAN ON DRESSING CHANGE SOMETIME TOMORROW LIKELY LATE PM MAY NEED REPEAT I/D ON FINGER ON 1/1 WILL CONTINUE TO FOLLOW   Merle Cirelli W 06/10/2012, 11:52 AM

## 2012-06-11 ENCOUNTER — Encounter (HOSPITAL_COMMUNITY): Payer: Self-pay | Admitting: Orthopedic Surgery

## 2012-06-11 LAB — GLUCOSE, CAPILLARY: Glucose-Capillary: 110 mg/dL — ABNORMAL HIGH (ref 70–99)

## 2012-06-11 MED ORDER — BISMUTH SUBSALICYLATE 262 MG/15ML PO SUSP
30.0000 mL | ORAL | Status: DC | PRN
  Administered 2012-06-11: 30 mL via ORAL
  Filled 2012-06-11: qty 236

## 2012-06-11 MED ORDER — SODIUM CHLORIDE 0.9 % IJ SOLN: 10.0000 mL | INTRAMUSCULAR | Status: DC | PRN

## 2012-06-11 MED ORDER — CLONIDINE HCL 0.1 MG PO TABS
0.1000 mg | ORAL_TABLET | Freq: Three times a day (TID) | ORAL | Status: DC
  Administered 2012-06-12: 0.1 mg via ORAL
  Filled 2012-06-11 (×5): qty 1

## 2012-06-11 MED ORDER — BISMUTH SUBSALICYLATE 262 MG/15ML PO SUSP
30.0000 mL | ORAL | Status: DC | PRN
  Filled 2012-06-11: qty 236

## 2012-06-11 MED ORDER — SULFAMETHOXAZOLE-TRIMETHOPRIM 400-80 MG PO TABS
1.0000 | ORAL_TABLET | Freq: Two times a day (BID) | ORAL | Status: DC
  Administered 2012-06-11 – 2012-06-12 (×3): 1 via ORAL
  Filled 2012-06-11 (×4): qty 1

## 2012-06-11 NOTE — Progress Notes (Signed)
Peripherally Inserted Central Catheter/Midline Placement  The IV Nurse has discussed with the patient and/or persons authorized to consent for the patient, the purpose of this procedure and the potential benefits and risks involved with this procedure.  The benefits include less needle sticks, lab draws from the catheter and patient may be discharged home with the catheter.  Risks include, but not limited to, infection, bleeding, blood clot (thrombus formation), and puncture of an artery; nerve damage and irregular heat beat.  Alternatives to this procedure were also discussed.  PICC/Midline Placement Documentation        Bryan Powell 06/11/2012, 12:26 PM

## 2012-06-11 NOTE — Progress Notes (Signed)
Subjective: 2 Days Post-Op Procedure(s) (LRB): IRRIGATION AND DEBRIDEMENT EXTREMITY (Left) Patient reports pain as moderate in intensity  Objective: Vital signs in last 24 hours: Temp:  [98 F (36.7 C)-101.1 F (38.4 C)] 98 F (36.7 C) (12/30 1416) Pulse Rate:  [75-88] 84  (12/30 1416) Resp:  [17-18] 17  (12/30 1416) BP: (107-137)/(53-75) 111/75 mmHg (12/30 1416) SpO2:  [97 %-98 %] 97 % (12/30 1416)  Intake/Output from previous day: 12/29 0701 - 12/30 0700 In: 550 [P.O.:550] Out: 1350 [Urine:1350] Intake/Output this shift:     Basename 06/10/12 0600 06/09/12 1612  HGB 11.9* 14.7    Basename 06/10/12 0600 06/09/12 1612  WBC 6.5 10.4  RBC 4.46 5.30  HCT 37.3* 44.2  PLT 117* 132*    Basename 06/10/12 0600 06/09/12 1612  NA 136 133*  K 3.6 4.0  CL 99 97  CO2 25 28  BUN 7 9  CREATININE 0.53 0.62  GLUCOSE 120* 86  CALCIUM 8.7 9.5   No results found for this basename: LABPT:2,INR:2 in the last 72 hours  Right hand: finger looks better, dressing changed Wiggles digits, Dorsum of hand better, overall hand looked a lot better Assessment/Plan: 2 Days Post-Op Procedure(s) (LRB): IRRIGATION AND DEBRIDEMENT EXTREMITY (Left) Overall hand and long finger look much better  Would not recommend surgery right now Ok to go home on oral abx regimen Will need one doctor only for prescription pain meds I will see him back in my office in one week Keep splint on at all times  Prescious Hurless W 06/11/2012, 6:58 PM

## 2012-06-11 NOTE — H&P (Deleted)
Pt refused assessment since Dr. Had just rounded on him at beginning of my shift.

## 2012-06-11 NOTE — Progress Notes (Signed)
[facility] PROGRESS NOTE  Bryan Powell AOZ:[id number] DOB: [birth year] DOA: 06/09/2012 PCP: Elby Showers, MD  Assessment/Plan: Principal Problem:  *Finger infection Active Problems:  HEROIN ABUSE    Finger infection - patient on vancomycin, waiting for PICC line, until then we'll place on by mouth Bactrim, surgical cultures ordered, , status post I&D, pain control will be a big issue post operatively, with the patient on a bundle of heroin a day at home for the past year, even high dose narcotics are likely to have only minimal effect. Continue to follow cultures,MAY NEED REPEAT I/D ON FINGER ON 1/1  Heroin Abuse , on OxyContin and oxycodone, no IV access for now   Code Status: full  Family Communication: family updated about patient's clinical progress  Disposition Plan: May need long-term antibiotics  Brief narrative:  Bryan Powell is a 61 y.o. male R hand dominant IVDU injecting heroin into his L hand. Unfortunately his last use 1.5 days ago lead to an infection of the long finger of his L hand. The finger is painful, very swollen, and suspected of having an abscess. Associated redness and warmth in the arm. Orthopaedic surgery was consulted and hospitalist has been asked to admit the patient.  Patient reports he uses about 1 bundle of heroin a day at this point, was clean for 5 years but started back a year ago. In the ED there were no SIRS criteria and workup otherwise was unremarkable.  Consultants:  Sharma Covert, MD  Procedures:  SURGICAL PROCEDURES:  1. Left long finger flexor sheath drainage.  2. Incision and drainage of dorsum of the hand.  Antibiotics:  Vancomycin started 12/28 HPI/Subjective:  Febrile overnight   Objective: Filed Vitals:   06/10/12 1408 06/10/12 2244 06/11/12 0154 06/11/12 0449  BP: 118/61 137/63  107/53  Pulse: 78 88  75  Temp: 98.1 F (36.7 C) 101.1 F (38.4 C) 100 F (37.8 C) 98.1 F (36.7 C)  TempSrc: Oral Oral   Oral  Resp: 19 18  18   Height:      Weight:      SpO2: 96% 98%  98%    Intake/Output Summary (Last 24 hours) at 06/11/12 0847 Last data filed at 06/11/12 0700  Gross per 24 hour  Intake    550 ml  Output   1350 ml  Net   -800 ml    Exam:  HENT:  Head: Atraumatic.  Nose: Nose normal.  Mouth/Throat: Oropharynx is clear and moist.  Eyes: Conjunctivae are normal. Pupils are equal, round, and reactive to light. No scleral icterus.  Neck: Neck supple. No tracheal deviation present.  Cardiovascular: Normal rate, regular rhythm, normal heart sounds and intact distal pulses.  Pulmonary/Chest: Effort normal and breath sounds normal. No respiratory distress.  Abdominal: Soft. Normal appearance and bowel sounds are normal. She exhibits no distension. There is no tenderness.  Musculoskeletal: She exhibits no edema and no tenderness.  Neurological: She is alert. No cranial nerve deficit.    Data Reviewed: Basic Metabolic Panel:  Lab 06/10/12 9629 06/09/12 1612  NA 136 133*  K 3.6 4.0  CL 99 97  CO2 25 28  GLUCOSE 120* 86  BUN 7 9  CREATININE 0.53 0.62  CALCIUM 8.7 9.5  MG -- --  PHOS -- --    Liver Function Tests:  Lab 06/09/12 1612  AST 30  ALT 30  ALKPHOS 70  BILITOT 1.0  PROT 7.7  ALBUMIN 3.8   No results found for this basename:  LIPASE:5,AMYLASE:5 in the last 168 hours No results found for this basename: AMMONIA:5 in the last 168 hours  CBC:  Lab 06/10/12 0600 06/09/12 1612  WBC 6.5 10.4  NEUTROABS -- 6.2  HGB 11.9* 14.7  HCT 37.3* 44.2  MCV 83.6 83.4  PLT 117* 132*    Cardiac Enzymes: No results found for this basename: CKTOTAL:5,CKMB:5,CKMBINDEX:5,TROPONINI:5 in the last 168 hours BNP (last 3 results)  Basename 10/13/11 0650  PROBNP 62.5     CBG:  Lab 06/11/12 0749 06/11/12 0155 06/10/12 0739  GLUCAP 110* 109* 163*    Recent Results (from the past 240 hour(s))  TISSUE CULTURE     Status: Normal (Preliminary result)   Collection Time    06/09/12 11:03 PM      Component Value Range Status Comment   Specimen Description TISSUE LEFT FINGER   Final    Special Requests NONE   Final    Gram Stain PENDING   Incomplete    Culture     Final    Value: FEW STAPHYLOCOCCUS AUREUS     Note: RIFAMPIN AND GENTAMICIN SHOULD NOT BE USED AS SINGLE DRUGS FOR TREATMENT OF STAPH INFECTIONS.   Report Status PENDING   Incomplete   CULTURE, BLOOD (ROUTINE X 2)     Status: Normal (Preliminary result)   Collection Time   06/10/12 12:20 AM      Component Value Range Status Comment   Specimen Description BLOOD RIGHT ARM   Final    Special Requests BOTTLES DRAWN AEROBIC ONLY 10CC   Final    Culture  Setup Time 06/10/2012 10:25   Final    Culture     Final    Value:        BLOOD CULTURE RECEIVED NO GROWTH TO DATE CULTURE WILL BE HELD FOR 5 DAYS BEFORE ISSUING A FINAL NEGATIVE REPORT   Report Status PENDING   Incomplete   CULTURE, BLOOD (ROUTINE X 2)     Status: Normal (Preliminary result)   Collection Time   06/10/12 12:31 AM      Component Value Range Status Comment   Specimen Description BLOOD RIGHT ARM   Final    Special Requests BOTTLES DRAWN AEROBIC ONLY 10CC   Final    Culture  Setup Time 06/10/2012 10:26   Final    Culture     Final    Value:        BLOOD CULTURE RECEIVED NO GROWTH TO DATE CULTURE WILL BE HELD FOR 5 DAYS BEFORE ISSUING A FINAL NEGATIVE REPORT   Report Status PENDING   Incomplete      Studies: Dg Hand Complete Left  06/09/2012  *RADIOLOGY REPORT*  Clinical Data: Extremity pain, arm swelling.  Bruising for 2 days. Redness radiates of the arm.  LEFT HAND - COMPLETE 3+ VIEW  Comparison: None.  Findings: There is diffuse soft tissue swelling of the hand.  There is no evidence for acute fracture or subluxation.  No radiopaque foreign body or soft tissue gas identified.  Mild degenerative changes are present.  IMPRESSION:  1.  Significant soft tissue swelling. 2. No evidence for acute osseous abnormality.   Original Report  Authenticated By: Norva Pavlov, M.D.     Scheduled Meds:   . multivitamin with minerals  1 tablet Oral Daily  . OxyCODONE  15 mg Oral Q12H  . pantoprazole  80 mg Oral Q1200  . senna  1 tablet Oral BID  . sodium chloride  500 mL Intravenous Once  .  sulfamethoxazole-trimethoprim  1 tablet Oral Q12H  . traMADol  100 mg Oral TID  . traZODone  100 mg Oral QHS  . valACYclovir  500 mg Oral BID  . vancomycin  1,250 mg Intravenous Q12H   Continuous Infusions:   Principal Problem:  *Finger infection Active Problems:  HEROIN ABUSE    Time spent: 40 minutes   [facility]  [facility] Pager [phone]. If 8PM-8AM, please contact night-coverage at [email or web address], password [facility] 06/11/2012, 8:47 AM  LOS: 2 days

## 2012-06-12 LAB — GLUCOSE, CAPILLARY: Glucose-Capillary: 96 mg/dL (ref 70–99)

## 2012-06-12 LAB — CBC
HCT: 38.6 % — ABNORMAL LOW (ref 39.0–52.0)
MCHC: 33.2 g/dL (ref 30.0–36.0)
RDW: 13.4 % (ref 11.5–15.5)

## 2012-06-12 MED ORDER — OXYCODONE HCL 5 MG PO TABS: 5.0000 mg | ORAL_TABLET | ORAL | Status: DC | PRN

## 2012-06-12 MED ORDER — CLONIDINE HCL 0.1 MG PO TABS: 0.1000 mg | ORAL_TABLET | Freq: Three times a day (TID) | ORAL | Status: DC

## 2012-06-12 MED ORDER — SULFAMETHOXAZOLE-TRIMETHOPRIM 400-80 MG PO TABS: 1.0000 | ORAL_TABLET | Freq: Two times a day (BID) | ORAL | Status: AC

## 2012-06-12 NOTE — Progress Notes (Signed)
Pt refused head to toe assessment being that the doctor had just rounded on him right before shift change.

## 2012-06-12 NOTE — Discharge Summary (Signed)
Physician Discharge Summary  Bryan Powell MRN: [id number] DOB/AGE: 16-Jul-1950 62 y.o.  PCP: Elby Showers, MD   Admit date: 06/09/2012 Discharge date: 06/12/2012  Discharge Diagnoses:     *Finger infection Active Problems:  HEROIN ABUSE     Medication List     As of 06/12/2012  9:15 AM    TAKE these medications         cloNIDine 0.1 MG tablet   Commonly known as: CATAPRES   Take 1 tablet (0.1 mg total) by mouth 3 (three) times daily.      cyclobenzaprine 10 MG tablet   Commonly known as: FLEXERIL   Take 1 tablet (10 mg total) by mouth at bedtime as needed. For muscle spasms.      esomeprazole 40 MG capsule   Commonly known as: NEXIUM   Take 1 capsule (40 mg total) by mouth daily before breakfast.      LORazepam 0.5 MG tablet   Commonly known as: ATIVAN   Take 1 tablet (0.5 mg total) by mouth 2 (two) times daily as needed. For anxiety.      methadone 10 MG tablet   Commonly known as: DOLOPHINE   Take 30 mg by mouth 2 (two) times daily.      multivitamin with minerals Tabs   Take 1 tablet by mouth daily.      oxyCODONE 10 MG 12 hr tablet   Commonly known as: OXYCONTIN   Take 1-2 tablets (10-20 mg total) by mouth every 12 (twelve) hours. For pain.      oxyCODONE 5 MG immediate release tablet   Commonly known as: Oxy IR/ROXICODONE   Take 1 tablet (5 mg total) by mouth every 4 (four) hours as needed for pain.      senna 8.6 MG Tabs   Commonly known as: SENOKOT   Take 1 tablet (8.6 mg total) by mouth 2 (two) times daily.      sulfamethoxazole-trimethoprim 400-80 MG per tablet   Commonly known as: BACTRIM,SEPTRA   Take 1 tablet by mouth every 12 (twelve) hours.      traMADol 50 MG tablet   Commonly known as: ULTRAM   Take 1 tablet (50 mg total) by mouth every 6 (six) hours as needed.      traZODone 100 MG tablet   Commonly known as: DESYREL   Take 100 mg by mouth at bedtime.      valACYclovir 500 MG tablet   Commonly known as: VALTREX   Take 1 tablet (500 mg total) by mouth 2 (two) times daily.        Discharge Condition: Stable Disposition: 01-Home or Self Care   Consults:  Sharma Covert, MD    Significant Diagnostic Studies: Dg Hand Complete Left  06/09/2012  *RADIOLOGY REPORT*  Clinical Data: Extremity pain, arm swelling.  Bruising for 2 days. Redness radiates of the arm.  LEFT HAND - COMPLETE 3+ VIEW  Comparison: None.  Findings: There is diffuse soft tissue swelling of the hand.  There is no evidence for acute fracture or subluxation.  No radiopaque foreign body or soft tissue gas identified.  Mild degenerative changes are present.  IMPRESSION:  1.  Significant soft tissue swelling. 2. No evidence for acute osseous abnormality.   Original Report Authenticated By: Norva Pavlov, M.D.       Microbiology: Recent Results (from the past 240 hour(s))  ANAEROBIC CULTURE     Status: Normal (Preliminary result)   Collection Time   06/09/12 10:57  PM      Component Value Range Status Comment   Specimen Description TISSUE LEFT FINGER   Final    Special Requests NONE   Final    Gram Stain PENDING   Incomplete    Culture     Final    Value: NO ANAEROBES ISOLATED; CULTURE IN PROGRESS FOR 5 DAYS   Report Status PENDING   Incomplete   TISSUE CULTURE     Status: Normal   Collection Time   06/09/12 11:03 PM      Component Value Range Status Comment   Specimen Description TISSUE LEFT FINGER   Final    Special Requests NONE   Final    Gram Stain     Final    Value: NO WBC SEEN     NO SQUAMOUS EPITHELIAL CELLS SEEN     FEW GRAM POSITIVE COCCI IN PAIRS   Culture     Final    Value: FEW STAPHYLOCOCCUS AUREUS     Note: RIFAMPIN AND GENTAMICIN SHOULD NOT BE USED AS SINGLE DRUGS FOR TREATMENT OF STAPH INFECTIONS.   Report Status 06/12/2012 FINAL   Final    Organism ID, Bacteria STAPHYLOCOCCUS AUREUS   Final   CULTURE, BLOOD (ROUTINE X 2)     Status: Normal (Preliminary result)   Collection Time   06/10/12 12:20 AM       Component Value Range Status Comment   Specimen Description BLOOD RIGHT ARM   Final    Special Requests BOTTLES DRAWN AEROBIC ONLY 10CC   Final    Culture  Setup Time 06/10/2012 10:25   Final    Culture     Final    Value:        BLOOD CULTURE RECEIVED NO GROWTH TO DATE CULTURE WILL BE HELD FOR 5 DAYS BEFORE ISSUING A FINAL NEGATIVE REPORT   Report Status PENDING   Incomplete   CULTURE, BLOOD (ROUTINE X 2)     Status: Normal (Preliminary result)   Collection Time   06/10/12 12:31 AM      Component Value Range Status Comment   Specimen Description BLOOD RIGHT ARM   Final    Special Requests BOTTLES DRAWN AEROBIC ONLY 10CC   Final    Culture  Setup Time 06/10/2012 10:26   Final    Culture     Final    Value:        BLOOD CULTURE RECEIVED NO GROWTH TO DATE CULTURE WILL BE HELD FOR 5 DAYS BEFORE ISSUING A FINAL NEGATIVE REPORT   Report Status PENDING   Incomplete      Labs: Results for orders placed during the hospital encounter of 06/09/12 (from the past 48 hour(s))  GLUCOSE, CAPILLARY     Status: Abnormal   Collection Time   06/11/12  1:55 AM      Component Value Range Comment   Glucose-Capillary 109 (*) 70 - 99 mg/dL   GLUCOSE, CAPILLARY     Status: Abnormal   Collection Time   06/11/12  7:49 AM      Component Value Range Comment   Glucose-Capillary 110 (*) 70 - 99 mg/dL    Comment 1 Notify RN     CBC     Status: Abnormal   Collection Time   06/12/12  5:50 AM      Component Value Range Comment   WBC 4.4  4.0 - 10.5 K/uL    RBC 4.68  4.22 - 5.81 MIL/uL    Hemoglobin 12.8 (*)  13.0 - 17.0 g/dL    HCT 16.1 (*) 09.6 - 52.0 %    MCV 82.5  78.0 - 100.0 fL    MCH 27.4  26.0 - 34.0 pg    MCHC 33.2  30.0 - 36.0 g/dL    RDW 04.5  40.9 - 81.1 %    Platelets 147 (*) 150 - 400 K/uL   GLUCOSE, CAPILLARY     Status: Normal   Collection Time   06/12/12  6:14 AM      Component Value Range Comment   Glucose-Capillary 96  70 - 99 mg/dL      HPI : Bryan Powell is a 61 y.o.  male R hand dominant IVDU injecting heroin into his L hand. Unfortunately his last use 1.5 days ago lead to an infection of the long finger of his L hand. The finger is painful, very swollen, and suspected of having an abscess. Associated redness and warmth in the arm. Orthopaedic surgery was consulted and hospitalist has been asked to admit the patient.  Patient reports he uses about 1 bundle of heroin a day at this point, was clean for 5 years but started back a year ago. In the ED there were no SIRS criteria and workup otherwise was unremarkable  HOSPITAL COURSE:  #1Left long finger flexor sheath infection. Left  hand dorsal hand infection. Status post incision and drainage on 12/29 Started on IV vancomycin Wound culture shows staph aureus sensitive to Bactrim Patient will be kept on Bactrim for 3 weeks He will follow up with orthopedics in one week Dressing changes as per instructed by orthopedics   #2 narcotic dependence Patient was treated with IV Dilaudid and subsequently switched to OxyContin and oxycodone He is being to let her 30 pills of 5 mg of oxycodone Patient has also been started on clonidine for narcotic withdrawal from heroine       Discharge Exam:  Blood pressure 144/73, pulse 70, temperature 98.5 F (36.9 C), temperature source Oral, resp. rate 20, height 5\' 11"  (1.803 m), weight 79.379 kg (175 lb), SpO2 99.00%.  General: NAD, resting comfortably in bed Eyes: PEERLA EOMI ENT: mucous membranes moist Neck: supple w/o JVD Cardiovascular: RRR w/o MRG Respiratory: CTA B Abdomen: soft, nt, nd, bs+ Skin: no edema, rash as described below Musculoskeletal: significant edema to the L hand and especially the middle finger, ecchymosis and erythema noted, areas of erythema on left forearm, purulent discharge noted in between 3rd and 4th didgets. Psychiatric: normal tone and affect Neurologic: AAOx3, grossly non-focal        Discharge Orders    Future Orders Please Complete By  Expires   Diet - low sodium heart healthy      Increase activity slowly         Follow-up Information    Follow up with Sharma Covert, MD. Schedule an appointment as soon as possible for a visit in 7 days.   Contact information:   [address] [place] [address] [phone]          Signed: Richarda Overlie 06/12/2012, 9:15 AM

## 2012-06-12 NOTE — Progress Notes (Signed)
Orthopedic Tech Progress Note Patient Details:  Bryan Powell 24-Jun-1950 [id number]  Ortho Devices Type of Ortho Device: Arm sling Ortho Device/Splint Location: left arm Ortho Device/Splint Interventions: Application   Nikki Dom 06/12/2012, 2:58 PM

## 2012-06-12 NOTE — Discharge Planning (Signed)
Patient discharged home in stable condition. Verbalizes understanding of all discharge instructions, including home medications and follow up appointments. 

## 2012-06-14 LAB — ANAEROBIC CULTURE: Gram Stain: NONE SEEN

## 2012-06-16 LAB — CULTURE, BLOOD (ROUTINE X 2): Culture: NO GROWTH

## 2012-10-04 ENCOUNTER — Ambulatory Visit (INDEPENDENT_AMBULATORY_CARE_PROVIDER_SITE_OTHER): Payer: BC Managed Care – PPO | Admitting: Surgery

## 2012-10-04 ENCOUNTER — Encounter (INDEPENDENT_AMBULATORY_CARE_PROVIDER_SITE_OTHER): Payer: Self-pay | Admitting: Surgery

## 2012-10-04 VITALS — BP 112/64 | HR 84 | Temp 98.2°F | Resp 18 | Ht 71.0 in | Wt 182.6 lb

## 2012-10-04 DIAGNOSIS — L03119 Cellulitis of unspecified part of limb: Secondary | ICD-10-CM

## 2012-10-04 DIAGNOSIS — L02416 Cutaneous abscess of left lower limb: Secondary | ICD-10-CM

## 2012-10-04 MED ORDER — OXYCODONE HCL 5 MG PO TABS: 5.0000 mg | ORAL_TABLET | Freq: Three times a day (TID) | ORAL | Status: DC | PRN

## 2012-10-04 NOTE — Progress Notes (Signed)
[name], male   DOB: 1950/10/22, 62 y.o.   MRN: [id number]  Chief Complaint  Patient presents with  . Other    Eval lt buttock abscess/ on Amox 875mg     HPI Bryan Powell is a 62 y.o. male.  Referred by Dr. Thayer Headings for left hip abscess HPI This is a 62 year old male with a history of paranoid abuse who recently injected himself in the left hip with a needle. This has been infected for the last 10 days. He was started on Augmentin by Dr. Ronne Binning on 09/27/12. He was reevaluated today and it seems that the abscess has enlarged and is more painful. He is referred to urgent office today for evaluation. The patient reports some subjective chills. Past Medical History  Diagnosis Date  . Reflux   . Lower extremity venous stasis   . Genital herpes   . GERD (gastroesophageal reflux disease)   . Hep C w/o coma, chronic   . Anxiety   . Depression   . ED (erectile dysfunction)     Past Surgical History  Procedure Laterality Date  . Tonsillectomy    . Orbital fracture surgery    . Cholecystectomy    . I&d extremity  06/09/2012    Procedure: IRRIGATION AND DEBRIDEMENT EXTREMITY;  Surgeon: Sharma Covert, MD;  Location: [facility] OR;  Service: Orthopedics;  Laterality: Left;    Family History  Problem Relation Age of Onset  . Stroke Mother   . Heart disease Father     Social History History  Substance Use Topics  . Smoking status: Former Games developer  . Smokeless tobacco: Never Used  . Alcohol Use: Yes  Heroin abuse  No Known Allergies  Current Outpatient Prescriptions  Medication Sig Dispense Refill  . amoxicillin (AMOXIL) 875 MG tablet Take 875 mg by mouth 2 (two) times daily.      . cyclobenzaprine (FLEXERIL) 10 MG tablet Take 1 tablet (10 mg total) by mouth at bedtime as needed. For muscle spasms.  10 tablet  0  . esomeprazole (NEXIUM) 40 MG capsule Take 1 capsule (40 mg total) by mouth daily before breakfast.  30 capsule  0  . methadone (DOLOPHINE) 10  MG tablet Take 30 mg by mouth 2 (two) times daily.      . Multiple Vitamin (MULTIVITAMIN WITH MINERALS) TABS Take 1 tablet by mouth daily.      . traZODone (DESYREL) 100 MG tablet Take 100 mg by mouth at bedtime.      . valACYclovir (VALTREX) 500 MG tablet Take 1 tablet (500 mg total) by mouth 2 (two) times daily.  20 tablet  0  . LORazepam (ATIVAN) 0.5 MG tablet Take 1 tablet (0.5 mg total) by mouth 2 (two) times daily as needed. For anxiety.  20 tablet  0  . oxyCODONE (OXYCONTIN) 10 MG 12 hr tablet Take 1-2 tablets (10-20 mg total) by mouth every 12 (twelve) hours. For pain.  30 tablet  0  . oxyCODONE (ROXICODONE) 5 MG immediate release tablet Take 1 tablet (5 mg total) by mouth every 4 (four) hours as needed for pain.  30 tablet  0  . oxyCODONE (ROXICODONE) 5 MG immediate release tablet Take 1 tablet (5 mg total) by mouth every 8 (eight) hours as needed for pain.  20 tablet  0   No current facility-administered medications for this visit.    Review of Systems Review of Systems  Constitutional: Positive for chills. Negative for fever and unexpected weight  change.  HENT: Negative for hearing loss, congestion, sore throat, trouble swallowing and voice change.   Eyes: Negative for visual disturbance.  Respiratory: Negative for cough and wheezing.   Cardiovascular: Negative for chest pain, palpitations and leg swelling.  Gastrointestinal: Negative for nausea, vomiting, abdominal pain, diarrhea, constipation, blood in stool, abdominal distention, anal bleeding and rectal pain.  Genitourinary: Negative for hematuria and difficulty urinating.  Musculoskeletal: Negative for arthralgias.  Skin: Positive for color change. Negative for rash and wound.  Neurological: Negative for seizures, syncope, weakness and headaches.  Hematological: Negative for adenopathy. Does not bruise/bleed easily.  Psychiatric/Behavioral: Negative for confusion.    Blood pressure 112/64, pulse 84, temperature 98.2 F  (36.8 C), temperature source Temporal, resp. rate 18, height 5\' 11"  (1.803 m), weight 182 lb 9.6 oz (82.827 kg).  Physical Exam Physical Exam  Skin:     L hip/ buttock - 10 cm area of erythema/ induration with central 4 cm area of fluctuance   Data Reviewed none  Assessment    Left hip abscess with surrounding cellulitis - this appears to be confined to the skin and subcutaneous tissues.     Plan    We prepped this area with Betadine and anesthetized with 1% lidocaine. I made a 1 1/2 cm round incision. I probed down into a large abscess cavity. We expressed about 50 mL's of Foul-smelling purulent drainage. I probed the cavity with the Q-tip and this seems to be about 2 x 3 cm. We packed the wound with quarter-inch Nu Gauze. A dry dressing is applied. We will have the patient change the outer dressing twice a day but leave the packing in place. We will reevaluate him on Monday to remove the packing. He should continue his antibiotics for now. I gave him a prescription for oxycodone with no refills.  Wilmon Arms. Corliss Skains, MD, [facility] Surgery  10/04/2012 4:09 PM        Kelita Wallis K. 10/04/2012, 4:02 PM

## 2012-10-08 ENCOUNTER — Ambulatory Visit (INDEPENDENT_AMBULATORY_CARE_PROVIDER_SITE_OTHER): Payer: BC Managed Care – PPO | Admitting: Surgery

## 2012-10-08 ENCOUNTER — Encounter (INDEPENDENT_AMBULATORY_CARE_PROVIDER_SITE_OTHER): Payer: Self-pay | Admitting: Surgery

## 2012-10-08 VITALS — BP 124/70 | HR 87 | Temp 97.6°F | Resp 16 | Ht 71.0 in | Wt 185.4 lb

## 2012-10-08 DIAGNOSIS — L03119 Cellulitis of unspecified part of limb: Secondary | ICD-10-CM

## 2012-10-08 DIAGNOSIS — L02416 Cutaneous abscess of left lower limb: Secondary | ICD-10-CM

## 2012-10-08 NOTE — Progress Notes (Signed)
The left hip abscess looks much better. The erythema is resolved. He still has some purulent drainage. The packing is removed and the tunnel is much smaller. We repacked this with about 1 inch of quarter-inch Nu Gauze. Dry dressing is applied. Continue with dressing changes and antibiotics. Recheck in 4 days.  I instructed him to switch to Neosporin if the packing falls out before Friday.  Wilmon Arms. Corliss Skains, MD, [facility] Surgery  10/08/2012 9:43 AM

## 2012-10-12 ENCOUNTER — Encounter (INDEPENDENT_AMBULATORY_CARE_PROVIDER_SITE_OTHER): Payer: Self-pay | Admitting: Surgery

## 2012-10-12 ENCOUNTER — Ambulatory Visit (INDEPENDENT_AMBULATORY_CARE_PROVIDER_SITE_OTHER): Payer: BC Managed Care – PPO | Admitting: Surgery

## 2012-10-12 VITALS — BP 122/80 | HR 64 | Temp 98.0°F | Resp 18 | Ht 71.0 in | Wt 186.0 lb

## 2012-10-12 DIAGNOSIS — L02416 Cutaneous abscess of left lower limb: Secondary | ICD-10-CM

## 2012-10-12 DIAGNOSIS — L02419 Cutaneous abscess of limb, unspecified: Secondary | ICD-10-CM

## 2012-10-12 DIAGNOSIS — L03119 Cellulitis of unspecified part of limb: Secondary | ICD-10-CM

## 2012-10-12 NOTE — Patient Instructions (Addendum)
Change the bandage over the wound daily.  On Monday, pull out the packing and treat the wound with Neosporin ointment and a dry dressing or large bandaid.  You may shower over this area.

## 2012-10-12 NOTE — Progress Notes (Signed)
The left hip wound continues to improve. The erythema is completely gone.  Minimal purulent drainage. The cavity is only about a centimeter deep. We repacked the wound. He removed the packing on Monday and will switch to Neosporin and a dry dressing. Followup in 2 weeks for final recheck.  Bryan Powell. Corliss Skains, MD, [facility] Surgery  10/12/2012 9:47 AM

## 2012-10-23 ENCOUNTER — Ambulatory Visit (INDEPENDENT_AMBULATORY_CARE_PROVIDER_SITE_OTHER): Payer: BC Managed Care – PPO | Admitting: Surgery

## 2012-10-23 ENCOUNTER — Encounter (INDEPENDENT_AMBULATORY_CARE_PROVIDER_SITE_OTHER): Payer: Self-pay | Admitting: Surgery

## 2012-10-23 VITALS — BP 121/75 | HR 74 | Temp 98.6°F | Resp 16 | Ht 71.0 in | Wt 180.6 lb

## 2012-10-23 DIAGNOSIS — L02419 Cutaneous abscess of limb, unspecified: Secondary | ICD-10-CM

## 2012-10-23 DIAGNOSIS — L02416 Cutaneous abscess of left lower limb: Secondary | ICD-10-CM

## 2012-10-23 NOTE — Progress Notes (Signed)
Followup of his left hip abscess. The wound is completely healed. There is no sign of infection. No cellulitis. No need for further dressing. The patient continues to abuse heroin.  Occasionally he injects it and the subcutaneous tissues over his buttock. I examined a couple of these areas and there is no sign of infection. I counseled him regarding the danger of using dirty needles. We will see him back as needed.  Wilmon Arms. Corliss Skains, MD, [facility] Surgery  General/ Trauma Surgery  10/23/2012 2:02 PM

## 2013-06-01 ENCOUNTER — Emergency Department (HOSPITAL_COMMUNITY)
Admission: EM | Admit: 2013-06-01 | Discharge: 2013-06-01 | Disposition: A | Discharge status: Home / Self Care | Payer: BC Managed Care – PPO | Attending: Emergency Medicine | Admitting: Emergency Medicine

## 2013-06-01 ENCOUNTER — Encounter (HOSPITAL_COMMUNITY): Payer: Self-pay | Admitting: Emergency Medicine

## 2013-06-01 DIAGNOSIS — F411 Generalized anxiety disorder: Secondary | ICD-10-CM | POA: Insufficient documentation

## 2013-06-01 DIAGNOSIS — Z792 Long term (current) use of antibiotics: Secondary | ICD-10-CM | POA: Insufficient documentation

## 2013-06-01 DIAGNOSIS — L0390 Cellulitis, unspecified: Secondary | ICD-10-CM

## 2013-06-01 DIAGNOSIS — Z87891 Personal history of nicotine dependence: Secondary | ICD-10-CM | POA: Insufficient documentation

## 2013-06-01 DIAGNOSIS — F329 Major depressive disorder, single episode, unspecified: Secondary | ICD-10-CM | POA: Insufficient documentation

## 2013-06-01 DIAGNOSIS — L0231 Cutaneous abscess of buttock: Secondary | ICD-10-CM | POA: Insufficient documentation

## 2013-06-01 DIAGNOSIS — Z87448 Personal history of other diseases of urinary system: Secondary | ICD-10-CM | POA: Insufficient documentation

## 2013-06-01 DIAGNOSIS — K219 Gastro-esophageal reflux disease without esophagitis: Secondary | ICD-10-CM | POA: Insufficient documentation

## 2013-06-01 DIAGNOSIS — Z8619 Personal history of other infectious and parasitic diseases: Secondary | ICD-10-CM | POA: Insufficient documentation

## 2013-06-01 DIAGNOSIS — F3289 Other specified depressive episodes: Secondary | ICD-10-CM | POA: Insufficient documentation

## 2013-06-01 DIAGNOSIS — Z8679 Personal history of other diseases of the circulatory system: Secondary | ICD-10-CM | POA: Insufficient documentation

## 2013-06-01 DIAGNOSIS — Z79899 Other long term (current) drug therapy: Secondary | ICD-10-CM | POA: Insufficient documentation

## 2013-06-01 MED ORDER — SULFAMETHOXAZOLE-TRIMETHOPRIM 800-160 MG PO TABS: 2.0000 | ORAL_TABLET | Freq: Two times a day (BID) | ORAL | Status: DC

## 2013-06-01 MED ORDER — KETOROLAC TROMETHAMINE 30 MG/ML IJ SOLN
30.0000 mg | Freq: Once | INTRAMUSCULAR | Status: AC
  Administered 2013-06-01: 30 mg via INTRAMUSCULAR
  Filled 2013-06-01: qty 1

## 2013-06-01 MED ORDER — HYDROCODONE-ACETAMINOPHEN 5-325 MG PO TABS
1.0000 | ORAL_TABLET | Freq: Once | ORAL | Status: DC
  Filled 2013-06-01: qty 1

## 2013-06-01 NOTE — ED Provider Notes (Signed)
CSN: [id number]     Arrival date & time 06/01/13  1257 History  This chart was scribed for non-physician practitioner Santiago Glad, PA-C working with Juliet Rude. [name], MD by Danella Maiers, ED Scribe. This patient was seen in room WTR6/WTR6 and the patient's care was started at 1:34 PM.   Chief Complaint  Patient presents with  . Abscess   The history is provided by the patient. No language interpreter was used.   HPI Comments: Bryan Powell is a 62 y.o. male with a h/o Hep C who presents to the Emergency Department complaining of an abscess to his right buttock onset yesterday. Pt states he is an IV heroine user and he stuck himself in the right buttock 2 days ago. He noticed the area was hard, red, and warm yesterday. No drainage from the area.  He denies fever, chills, nausea, vomiting.  He reports that he has had abscesses several times in the past, which usually resolve with antibiotics.  Past Medical History  Diagnosis Date  . Reflux   . Lower extremity venous stasis   . Genital herpes   . GERD (gastroesophageal reflux disease)   . Hep C w/o coma, chronic   . Anxiety   . Depression   . ED (erectile dysfunction)    Past Surgical History  Procedure Laterality Date  . Tonsillectomy    . Orbital fracture surgery    . Cholecystectomy    . I&d extremity  06/09/2012    Procedure: IRRIGATION AND DEBRIDEMENT EXTREMITY;  Surgeon: Sharma Covert, MD;  Location: [facility] OR;  Service: Orthopedics;  Laterality: Left;   Family History  Problem Relation Age of Onset  . Stroke Mother   . Heart disease Father    History  Substance Use Topics  . Smoking status: Former Games developer  . Smokeless tobacco: Never Used  . Alcohol Use: Yes    Review of Systems  Constitutional: Negative for fever.  Gastrointestinal: Negative for nausea and vomiting.  Skin: Positive for wound (abscess).   A complete 10 system review of systems was obtained and all systems are negative except as noted in the  HPI and PMH.   Allergies  Review of patient's allergies indicates no known allergies.  Home Medications   Current Outpatient Rx  Name  Route  Sig  Dispense  Refill  . amoxicillin (AMOXIL) 875 MG tablet   Oral   Take 875 mg by mouth 2 (two) times daily.         Marland Kitchen amoxicillin-clavulanate (AUGMENTIN) 875-125 MG per tablet               . cyclobenzaprine (FLEXERIL) 10 MG tablet   Oral   Take 1 tablet (10 mg total) by mouth at bedtime as needed. For muscle spasms.   10 tablet   0   . diazepam (VALIUM) 5 MG tablet               . esomeprazole (NEXIUM) 40 MG capsule   Oral   Take 1 capsule (40 mg total) by mouth daily before breakfast.   30 capsule   0   . LORazepam (ATIVAN) 0.5 MG tablet   Oral   Take 1 tablet (0.5 mg total) by mouth 2 (two) times daily as needed. For anxiety.   20 tablet   0   . methadone (DOLOPHINE) 10 MG tablet   Oral   Take 30 mg by mouth 2 (two) times daily.         Marland Kitchen  Multiple Vitamin (MULTIVITAMIN WITH MINERALS) TABS   Oral   Take 1 tablet by mouth daily.         Marland Kitchen oxyCODONE (ROXICODONE) 5 MG immediate release tablet   Oral   Take 1 tablet (5 mg total) by mouth every 4 (four) hours as needed for pain.   30 tablet   0   . oxyCODONE (ROXICODONE) 5 MG immediate release tablet   Oral   Take 1 tablet (5 mg total) by mouth every 8 (eight) hours as needed for pain.   20 tablet   0   . traZODone (DESYREL) 100 MG tablet   Oral   Take 100 mg by mouth at bedtime.         . valACYclovir (VALTREX) 500 MG tablet   Oral   Take 1 tablet (500 mg total) by mouth 2 (two) times daily.   20 tablet   0    BP 134/67  Pulse 84  Temp(Src) 98.7 F (37.1 C) (Oral)  Resp 20  SpO2 96% Physical Exam  Nursing note and vitals reviewed. Constitutional: He appears well-developed and well-nourished.  HENT:  Head: Normocephalic and atraumatic.  Mouth/Throat: Oropharynx is clear and moist.  Eyes: EOM are normal. Pupils are equal, round, and  reactive to light.  Neck: Normal range of motion. Neck supple.  Cardiovascular: Normal rate, regular rhythm and normal heart sounds.   Pulmonary/Chest: Effort normal and breath sounds normal. He has no wheezes.  Musculoskeletal: Normal range of motion.  Neurological: He is alert.  Skin: Skin is warm and dry.     Psychiatric: He has a normal mood and affect. His behavior is normal.    ED Course  Procedures (including critical care time) Medications - No data to display  DIAGNOSTIC STUDIES: Oxygen Saturation is 96% on RA, normal by my interpretation.    COORDINATION OF CARE: 2:03 PM- Discussed treatment plan with pt. Pt agrees to plan.    Labs Review Labs Reviewed - No data to display Imaging Review No results found.  EKG Interpretation   None       MDM  No diagnosis found. Patient with an erythematous indurated area of the right buttocks.  Area evaluated with bedside ultrasound.  No apparent fluid collection present.  Patient is afebrile and nontoxic appearing.  Patient started on antibiotics and is stable for discharge.  Patient instructed to follow up in 2 days to have the area rechecked.  Return precautions given.  I personally performed the services described in this documentation, which was scribed in my presence. The recorded information has been reviewed and is accurate.    Santiago Glad, PA-C 06/03/13 [phone]

## 2013-06-01 NOTE — ED Notes (Signed)
Pt from home c/o abscess to R buttock. Pt states that he is a IV drug user and has no veins so "I hit myself in the butt." Pt is A&O and in NAD

## 2013-06-05 NOTE — ED Provider Notes (Signed)
Medical screening examination/treatment/procedure(s) were performed by non-physician practitioner and as supervising physician I was immediately available for consultation/collaboration.  EKG Interpretation   None        Juliet Rude. [name], MD 06/05/13 1610

## 2013-09-21 ENCOUNTER — Encounter (HOSPITAL_COMMUNITY): Payer: Self-pay | Admitting: Emergency Medicine

## 2013-09-21 ENCOUNTER — Emergency Department (HOSPITAL_COMMUNITY)
Admission: EM | Admit: 2013-09-21 | Discharge: 2013-09-22 | Disposition: A | Discharge status: Home / Self Care | Payer: BC Managed Care – PPO | Attending: Emergency Medicine | Admitting: Emergency Medicine

## 2013-09-21 DIAGNOSIS — Z87448 Personal history of other diseases of urinary system: Secondary | ICD-10-CM | POA: Insufficient documentation

## 2013-09-21 DIAGNOSIS — F329 Major depressive disorder, single episode, unspecified: Secondary | ICD-10-CM | POA: Insufficient documentation

## 2013-09-21 DIAGNOSIS — K219 Gastro-esophageal reflux disease without esophagitis: Secondary | ICD-10-CM | POA: Insufficient documentation

## 2013-09-21 DIAGNOSIS — Z87891 Personal history of nicotine dependence: Secondary | ICD-10-CM | POA: Insufficient documentation

## 2013-09-21 DIAGNOSIS — Z8619 Personal history of other infectious and parasitic diseases: Secondary | ICD-10-CM | POA: Insufficient documentation

## 2013-09-21 DIAGNOSIS — F411 Generalized anxiety disorder: Secondary | ICD-10-CM | POA: Insufficient documentation

## 2013-09-21 DIAGNOSIS — Z79899 Other long term (current) drug therapy: Secondary | ICD-10-CM | POA: Insufficient documentation

## 2013-09-21 DIAGNOSIS — L0231 Cutaneous abscess of buttock: Secondary | ICD-10-CM | POA: Insufficient documentation

## 2013-09-21 DIAGNOSIS — F3289 Other specified depressive episodes: Secondary | ICD-10-CM | POA: Insufficient documentation

## 2013-09-21 DIAGNOSIS — L03317 Cellulitis of buttock: Secondary | ICD-10-CM

## 2013-09-21 DIAGNOSIS — Z8679 Personal history of other diseases of the circulatory system: Secondary | ICD-10-CM | POA: Insufficient documentation

## 2013-09-21 LAB — BASIC METABOLIC PANEL
BUN: 8 mg/dL (ref 6–23)
CO2: 26 mEq/L (ref 19–32)
CREATININE: 0.57 mg/dL (ref 0.50–1.35)
Calcium: 9.1 mg/dL (ref 8.4–10.5)
Chloride: 105 mEq/L (ref 96–112)
Glucose, Bld: 137 mg/dL — ABNORMAL HIGH (ref 70–99)
POTASSIUM: 3.5 meq/L — AB (ref 3.7–5.3)
Sodium: 145 mEq/L (ref 137–147)

## 2013-09-21 LAB — CBC
HEMATOCRIT: 42.6 % (ref 39.0–52.0)
Hemoglobin: 14.7 g/dL (ref 13.0–17.0)
MCH: 29.5 pg (ref 26.0–34.0)
MCHC: 34.5 g/dL (ref 30.0–36.0)
MCV: 85.4 fL (ref 78.0–100.0)
Platelets: 154 10*3/uL (ref 150–400)
RBC: 4.99 MIL/uL (ref 4.22–5.81)
RDW: 15 % (ref 11.5–15.5)
WBC: 6.7 10*3/uL (ref 4.0–10.5)

## 2013-09-21 MED ORDER — HYDROMORPHONE HCL PF 1 MG/ML IJ SOLN
1.0000 mg | Freq: Once | INTRAMUSCULAR | Status: AC
  Administered 2013-09-21: 1 mg via INTRAMUSCULAR
  Filled 2013-09-21: qty 1

## 2013-09-21 NOTE — ED Notes (Signed)
Pt c/o left buttucks abscess, and a right buttocks abscess. Left buttocks hurts worse.

## 2013-09-21 NOTE — ED Provider Notes (Signed)
CSN: [id number]     Arrival date & time 09/21/13  2032 History  This chart was scribed for non-physician practitioner Johnnette Gourdobyn Albert, PA working with Dr. Chaney Mallingavid Yao, MD by Elveria Risingimelie Horne, ED Scribe. This patient was seen in room TR10C/TR10C and the patient's care was started at 9:21 PM.   Chief Complaint  Patient presents with  . Abscess      The history is provided by the patient. No language interpreter was used.   HPI Comments: Bryan Powell is a 63 y.o. male who presents to the Emergency Department complaining of burning abscess on left buttocks, onset 3-4 days. Patient has been treating his buttocks with ice, but reports that his abscess has worsened: growing in size, firming since presentation, with noticeable warmth. Patient reports subjective fever.   Past Medical History  Diagnosis Date  . Reflux   . Lower extremity venous stasis   . Genital herpes   . GERD (gastroesophageal reflux disease)   . Hep C w/o coma, chronic   . Anxiety   . Depression   . ED (erectile dysfunction)    Past Surgical History  Procedure Laterality Date  . Tonsillectomy    . Orbital fracture surgery    . Cholecystectomy    . I&d extremity  06/09/2012    Procedure: IRRIGATION AND DEBRIDEMENT EXTREMITY;  Surgeon: Sharma CovertFred W Ortmann, MD;  [facility];  Service: Orthopedics;  Laterality: Left;   Family History  Problem Relation Age of Onset  . Stroke Mother   . Heart disease Father    History  Substance Use Topics  . Smoking status: Former Games developermoker  . Smokeless tobacco: Never Used  . Alcohol Use: Yes    Review of Systems A complete 10 system review of systems was obtained and all systems are negative except as noted in the HPI and PMH.    Allergies  Review of patient's allergies indicates no known allergies.  Home Medications   Current Outpatient Rx  Name  Route  Sig  Dispense  Refill  . esomeprazole (NEXIUM) 40 MG capsule   Oral   Take 1 capsule (40 mg total) by mouth daily before  breakfast.   30 capsule   0   . Multiple Vitamin (MULTIVITAMIN WITH MINERALS) TABS   Oral   Take 1 tablet by mouth daily.         . traZODone (DESYREL) 100 MG tablet   Oral   Take 150 mg by mouth at bedtime.         . cephALEXin (KEFLEX) 500 MG capsule   Oral   Take 1 capsule (500 mg total) by mouth 4 (four) times daily.   28 capsule   0   . HYDROcodone-ibuprofen (VICOPROFEN) 7.5-200 MG per tablet   Oral   Take 1 tablet by mouth every 6 (six) hours as needed for moderate pain.   8 tablet   0   . sulfamethoxazole-trimethoprim (BACTRIM DS) 800-160 MG per tablet   Oral   Take 1 tablet by mouth 2 (two) times daily.   14 tablet   0    Triage Vitals: BP 135/71  Pulse 80  Temp(Src) 98 F (36.7 C) (Oral)  Resp 18  Ht 5\' 11"  (1.803 m)  Wt 180 lb (81.647 kg)  BMI 25.12 kg/m2  SpO2 95%  Physical Exam  Nursing note and vitals reviewed. Constitutional: He is oriented to person, place, and time. He appears well-developed and well-nourished. No distress.  HENT:  Head: Normocephalic and  atraumatic.  Eyes: Conjunctivae and EOM are normal.  Neck: Normal range of motion. Neck supple.  Cardiovascular: Normal rate, regular rhythm and normal heart sounds.   Pulmonary/Chest: Effort normal and breath sounds normal.  Musculoskeletal: Normal range of motion. He exhibits no edema.  Neurological: He is alert and oriented to person, place, and time.  Skin: Skin is warm and dry. There is erythema.  4cm diameter area of induration with surrounding erythema and warmth over left buttock area, tender from mid buttock to perineum. 1 cm diameter area of induration over Center of right buttock, no fluctuance, erythema or warmth.  Psychiatric: He has a normal mood and affect. His behavior is normal.    ED Course  Procedures (including critical care time) INCISION AND DRAINAGE Performed by: Trevor Mace Consent: Verbal consent obtained. Risks and benefits: risks, benefits and  alternatives were discussed Type: abscess  Body area: left buttock  Anesthesia: local infiltration  Incision was made with a scalpel.  Local anesthetic: lidocaine 2% with epinephrine  Anesthetic total: 2 ml  Complexity: complex Blunt dissection to break up loculations  Drainage: sanguinous  Drainage amount: minimal   Patient tolerance: Patient tolerated the procedure well with no immediate complications.    DIAGNOSTIC STUDIES: Oxygen Saturation is 95% on room air, adequate by my interpretation.    COORDINATION OF CARE: 9:21 PM- Planned to lance abscess. Discussed treatment plan with patient at bedside and patient agreed to plan.     Labs Review Labs Reviewed  BASIC METABOLIC PANEL - Abnormal; Notable for the following:    Potassium 3.5 (*)    Glucose, Bld 137 (*)    All other components within normal limits  CBC   Imaging Review Ct Pelvis W Contrast  09/22/2013   CLINICAL DATA:  Cellulitis, possible bilateral buttock abscess.  EXAM: CT PELVIS WITH CONTRAST  TECHNIQUE: Multidetector CT imaging of the pelvis was performed using the standard protocol following the bolus administration of intravenous contrast.  CONTRAST:  OMNIPAQUE IOHEXOL 300 MG/ML  SOLN  COMPARISON:  US ABDOMEN COMPLETE dated 08/11/2011  FINDINGS: Bilateral gluteal subcutaneous fat stranding, without subcutaneous gas, radiopaque foreign bodies, free fluid or rim enhancing fluid collections. Pelvic musculature is unremarkable, no CT findings of myositis.  Included view of the abdomen demonstrates a moderate amount of retained fecal stool. A few sigmoid diverticula. No intraperitoneal free fluid nor free air. Urinary bladder is well distended and unremarkable. Prostate is not enlarged.  No destructive bony lesions. Transitional anatomy, partially sacralized L5 vertebral body with severe lower lumbar facet arthropathy.  IMPRESSION: Bilateral gluteal subcutaneous fat stranding may reflect cellulitis without  abscess or subcutaneous gas.   Electronically Signed   By: Awilda Metro   On: 09/22/2013 00:49     EKG Interpretation None      MDM   Final diagnoses:  Cellulitis of buttock    Patient presenting with abscess with surrounding cellulitis to left buttock area. He is well appearing and in no apparent distress, afebrile, vital signs stable. I&D attempted, no purulent material expressed. Concern for deeper infection. Labs, CT pelvis pending.  1:13 AM CT scan showing bilateral gluteal subcutaneous fat stranding reflecting cellulitis without abscess or subcutaneous fat. Patient remains well appearing and in no apparent distress, vital signs stable. Will treat with Bactrim, Keflex and pain medication. He will followup with his primary care physician in 2 days. Stable for discharge. Return precautions given. Patient states understanding of treatment care plan and is agreeable.   I  personally performed the services described in this documentation, which was scribed in my presence. The recorded information has been reviewed and is accurate.    Trevor Mace, PA-C 09/22/13 0114

## 2013-09-22 ENCOUNTER — Encounter (HOSPITAL_COMMUNITY): Payer: Self-pay | Admitting: Radiology

## 2013-09-22 ENCOUNTER — Emergency Department (HOSPITAL_COMMUNITY): Payer: BC Managed Care – PPO

## 2013-09-22 MED ORDER — SULFAMETHOXAZOLE-TMP DS 800-160 MG PO TABS: 1.0000 | ORAL_TABLET | Freq: Two times a day (BID) | ORAL | Status: DC

## 2013-09-22 MED ORDER — IOHEXOL 300 MG/ML  SOLN
100.0000 mL | Freq: Once | INTRAMUSCULAR | Status: AC | PRN
  Administered 2013-09-22: 100 mL via INTRAVENOUS

## 2013-09-22 MED ORDER — HYDROCODONE-IBUPROFEN 7.5-200 MG PO TABS: 1.0000 | ORAL_TABLET | Freq: Four times a day (QID) | ORAL | Status: DC | PRN

## 2013-09-22 MED ORDER — CEPHALEXIN 500 MG PO CAPS: 500.0000 mg | ORAL_CAPSULE | Freq: Four times a day (QID) | ORAL | Status: DC

## 2013-09-22 NOTE — ED Provider Notes (Signed)
Medical screening examination/treatment/procedure(s) were performed by non-physician practitioner and as supervising physician I was immediately available for consultation/collaboration.   EKG Interpretation None        David H Yao, MD 09/22/13 1511 

## 2013-09-22 NOTE — Discharge Instructions (Signed)
Take both antibiotics to completion for the next 7 days. Take Vicoprofen as directed for severe pain, no driving or operating heavy machinery while taking this drug as it may cause drowsiness. Followup with your doctor in 2 days for recheck.  Cellulitis Cellulitis is an infection of the skin and the tissue beneath it. The infected area is usually red and tender. Cellulitis occurs most often in the arms and lower legs.  CAUSES  Cellulitis is caused by bacteria that enter the skin through cracks or cuts in the skin. The most common types of bacteria that cause cellulitis are Staphylococcus and Streptococcus. SYMPTOMS   Redness and warmth.  Swelling.  Tenderness or pain.  Fever. DIAGNOSIS  Your caregiver can usually determine what is wrong based on a physical exam. Blood tests may also be done. TREATMENT  Treatment usually involves taking an antibiotic medicine. HOME CARE INSTRUCTIONS   Take your antibiotics as directed. Finish them even if you start to feel better.  Keep the infected arm or leg elevated to reduce swelling.  Apply a warm cloth to the affected area up to 4 times per day to relieve pain.  Only take over-the-counter or prescription medicines for pain, discomfort, or fever as directed by your caregiver.  Keep all follow-up appointments as directed by your caregiver. SEEK MEDICAL CARE IF:   You notice red streaks coming from the infected area.  Your red area gets larger or turns dark in color.  Your bone or joint underneath the infected area becomes painful after the skin has healed.  Your infection returns in the same area or another area.  You notice a swollen bump in the infected area.  You develop new symptoms. SEEK IMMEDIATE MEDICAL CARE IF:   You have a fever.  You feel very sleepy.  You develop vomiting or diarrhea.  You have a general ill feeling (malaise) with muscle aches and pains. MAKE SURE YOU:   Understand these instructions.  Will watch  your condition.  Will get help right away if you are not doing well or get worse. Document Released: 03/09/2005 Document Revised: 11/29/2011 Document Reviewed: 08/15/2011 [facility] Patient Information 2014 [place], [place].

## 2013-09-22 NOTE — ED Notes (Signed)
Ct called

## 2013-10-04 ENCOUNTER — Emergency Department (HOSPITAL_COMMUNITY)
Admission: EM | Admit: 2013-10-04 | Discharge: 2013-10-05 | Disposition: A | Discharge status: Other Acute Inpatient Hospital | Payer: BC Managed Care – PPO | Attending: Emergency Medicine | Admitting: Emergency Medicine

## 2013-10-04 ENCOUNTER — Encounter (HOSPITAL_COMMUNITY): Payer: Self-pay | Admitting: Emergency Medicine

## 2013-10-04 DIAGNOSIS — Z792 Long term (current) use of antibiotics: Secondary | ICD-10-CM | POA: Insufficient documentation

## 2013-10-04 DIAGNOSIS — Z87448 Personal history of other diseases of urinary system: Secondary | ICD-10-CM | POA: Insufficient documentation

## 2013-10-04 DIAGNOSIS — Z8619 Personal history of other infectious and parasitic diseases: Secondary | ICD-10-CM | POA: Insufficient documentation

## 2013-10-04 DIAGNOSIS — F3289 Other specified depressive episodes: Secondary | ICD-10-CM | POA: Insufficient documentation

## 2013-10-04 DIAGNOSIS — F329 Major depressive disorder, single episode, unspecified: Secondary | ICD-10-CM | POA: Insufficient documentation

## 2013-10-04 DIAGNOSIS — Z87891 Personal history of nicotine dependence: Secondary | ICD-10-CM | POA: Insufficient documentation

## 2013-10-04 DIAGNOSIS — F411 Generalized anxiety disorder: Secondary | ICD-10-CM | POA: Insufficient documentation

## 2013-10-04 DIAGNOSIS — K219 Gastro-esophageal reflux disease without esophagitis: Secondary | ICD-10-CM | POA: Insufficient documentation

## 2013-10-04 DIAGNOSIS — Z79899 Other long term (current) drug therapy: Secondary | ICD-10-CM | POA: Insufficient documentation

## 2013-10-04 DIAGNOSIS — F1120 Opioid dependence, uncomplicated: Secondary | ICD-10-CM | POA: Insufficient documentation

## 2013-10-04 HISTORY — DX: Alcohol abuse, uncomplicated: F10.10

## 2013-10-04 HISTORY — DX: Opioid abuse, uncomplicated: F11.10

## 2013-10-04 LAB — CBC
HCT: 44.7 % (ref 39.0–52.0)
Hemoglobin: 15.1 g/dL (ref 13.0–17.0)
MCH: 28.5 pg (ref 26.0–34.0)
MCHC: 33.8 g/dL (ref 30.0–36.0)
MCV: 84.5 fL (ref 78.0–100.0)
PLATELETS: 190 10*3/uL (ref 150–400)
RBC: 5.29 MIL/uL (ref 4.22–5.81)
RDW: 14.3 % (ref 11.5–15.5)
WBC: 6.8 10*3/uL (ref 4.0–10.5)

## 2013-10-04 LAB — ACETAMINOPHEN LEVEL: Acetaminophen (Tylenol), Serum: <15 ug/mL (ref 10–30)

## 2013-10-04 LAB — COMPREHENSIVE METABOLIC PANEL
ALT: 25 U/L (ref 0–53)
AST: 24 U/L (ref 0–37)
Albumin: 3.9 g/dL (ref 3.5–5.2)
Alkaline Phosphatase: 67 U/L (ref 39–117)
BUN: 7 mg/dL (ref 6–23)
CALCIUM: 9.3 mg/dL (ref 8.4–10.5)
CO2: 23 mEq/L (ref 19–32)
Chloride: 103 mEq/L (ref 96–112)
Creatinine, Ser: 0.58 mg/dL (ref 0.50–1.35)
GFR calc Af Amer: >90 mL/min (ref >90)
GFR calc non Af Amer: >90 mL/min (ref >90)
Glucose, Bld: 143 mg/dL — ABNORMAL HIGH (ref 70–99)
Potassium: 3.6 mEq/L — ABNORMAL LOW (ref 3.7–5.3)
SODIUM: 141 meq/L (ref 137–147)
TOTAL PROTEIN: 7.7 g/dL (ref 6.0–8.3)
Total Bilirubin: 0.5 mg/dL (ref 0.3–1.2)

## 2013-10-04 LAB — SALICYLATE LEVEL: Salicylate Lvl: <2 mg/dL — ABNORMAL LOW (ref 2.8–20.0)

## 2013-10-04 LAB — ETHANOL: Alcohol, Ethyl (B): 53 mg/dL — ABNORMAL HIGH (ref 0–11)

## 2013-10-04 MED ORDER — POTASSIUM CHLORIDE CRYS ER 20 MEQ PO TBCR
20.0000 meq | EXTENDED_RELEASE_TABLET | Freq: Once | ORAL | Status: AC
Start: 2013-10-04 — End: 2013-10-04
  Administered 2013-10-04: 20 meq via ORAL
  Filled 2013-10-04: qty 1

## 2013-10-04 NOTE — ED Notes (Signed)
NP at bedside.

## 2013-10-04 NOTE — ED Notes (Signed)
Pt on way back to room stated, "wait, I need to go. I need to go talk to my wife." when checked back in waiting room pt was in waitiing room or outside.

## 2013-10-04 NOTE — ED Provider Notes (Signed)
CSN: [id number]     Arrival date & time 10/04/13  1818 History   First MD Initiated Contact with Patient 10/04/13 2224     Chief Complaint  Patient presents with  . Medical Clearance     (Consider location/radiation/quality/duration/timing/severity/associated sxs/prior Treatment) HPI Comments: Patient presents today requesting detox from heroine, which he snorts on a daily basis for the last 6 months.  He, states he was clean for a number of years resumed recently he also resumed going to see his sponsor.  He is requesting short-term detox.  He does not want to go into rehabilitation facility.  He, states at this time.  Is not having a physical symptoms of withdrawal  The history is provided by the patient.    Past Medical History  Diagnosis Date  . Reflux   . Lower extremity venous stasis   . Genital herpes   . GERD (gastroesophageal reflux disease)   . Hep C w/o coma, chronic   . Anxiety   . Depression   . ED (erectile dysfunction)    Past Surgical History  Procedure Laterality Date  . Tonsillectomy    . Orbital fracture surgery    . Cholecystectomy    . I&d extremity  06/09/2012    Procedure: IRRIGATION AND DEBRIDEMENT EXTREMITY;  Surgeon: Sharma CovertFred W Ortmann, MD;  [facility];  Service: Orthopedics;  Laterality: Left;   Family History  Problem Relation Age of Onset  . Stroke Mother   . Heart disease Father    History  Substance Use Topics  . Smoking status: Former Games developermoker  . Smokeless tobacco: Never Used  . Alcohol Use: Yes    Review of Systems  Constitutional: Negative for fever.  Respiratory: Negative for shortness of breath.   Cardiovascular: Negative for chest pain.  Gastrointestinal: Negative for abdominal pain and diarrhea.  Psychiatric/Behavioral: Negative for suicidal ideas. The patient is not nervous/anxious.   All other systems reviewed and are negative.     Allergies  Review of patient's allergies indicates no known allergies.  Home Medications    Prior to Admission medications   Medication Sig Start Date End Date Taking? Authorizing Provider  cephALEXin (KEFLEX) 500 MG capsule Take 1 capsule (500 mg total) by mouth 4 (four) times daily. 09/22/13  Yes Trevor Maceobyn M Albert, PA-C  esomeprazole (NEXIUM) 20 MG capsule Take 20 mg by mouth daily at 12 noon.   Yes Historical Provider, MD  Multiple Vitamin (MULTIVITAMIN WITH MINERALS) TABS Take 1 tablet by mouth daily.   Yes Historical Provider, MD  sulfamethoxazole-trimethoprim (BACTRIM DS) 800-160 MG per tablet Take 1 tablet by mouth 2 (two) times daily. 09/22/13  Yes Trevor Maceobyn M Albert, PA-C  traZODone (DESYREL) 100 MG tablet Take 150 mg by mouth at bedtime. 09/14/13  Yes Historical Provider, MD   BP 108/62  Pulse 64  Temp(Src) 98.4 F (36.9 C) (Oral)  Resp 18  Wt 175 lb 5 oz (79.521 kg)  SpO2 98% Physical Exam  Nursing note and vitals reviewed. Constitutional: He is oriented to person, place, and time. He appears well-developed and well-nourished.  HENT:  Head: Normocephalic.  Eyes: Pupils are equal, round, and reactive to light.  Neck: Normal range of motion.  Cardiovascular: Normal rate and regular rhythm.   Pulmonary/Chest: Effort normal and breath sounds normal.  Neurological: He is alert and oriented to person, place, and time.  Skin: Skin is warm and dry.  Psychiatric: He has a normal mood and affect. His speech is normal and behavior is  normal. Thought content normal. Cognition and memory are normal. He expresses inappropriate judgment.    ED Course  Procedures (including critical care time) Labs Review Labs Reviewed  COMPREHENSIVE METABOLIC PANEL - Abnormal; Notable for the following:    Potassium 3.6 (*)    Glucose, Bld 143 (*)    All other components within normal limits  ETHANOL - Abnormal; Notable for the following:    Alcohol, Ethyl (B) 53 (*)    All other components within normal limits  SALICYLATE LEVEL - Abnormal; Notable for the following:    Salicylate Lvl <2.0 (*)     All other components within normal limits  ACETAMINOPHEN LEVEL  CBC  URINE RAPID DRUG SCREEN (HOSP PERFORMED)    Imaging Review No results found.   EKG Interpretation None      MDM  7 review potassium is slightly low.  This is been supplemented.  Will contact TTS for evaluation Patient has been evaluated.  He meets criteria for the H&H.  Assessment bed will be available after 7:30 in the morning.  I will start COWS to help control his withdrawal symptoms Final diagnoses:  Opiate addiction         Arman FilterGail K Tripton Ned, NP 10/04/13 2300  Arman FilterGail K Ezella Kell, NP 10/05/13 [phone]

## 2013-10-04 NOTE — ED Notes (Signed)
Presents requsting detox from heroine-pt sniffs it-uses everyday-last use this AM. [facility]. Denies SI and HI. Denies use of other drugs.

## 2013-10-04 NOTE — ED Notes (Signed)
Pt. Here requesting detox from heroine. Last use was this AM. States "I already feel like I'm going into withdrawals". Pt. Also reports alcohol abuse. Drink approx 2 pints of wine a day. Pt. States "I have never gotten sick when detoxing from alcohol". Denies SI/HI. Pt. Is alert and oriented x4, pleasant and cooperative.

## 2013-10-05 ENCOUNTER — Inpatient Hospital Stay (HOSPITAL_COMMUNITY)
Admission: AD | Admit: 2013-10-05 | Discharge: 2013-10-09 | DRG: 897 | Disposition: A | Discharge status: Home / Self Care | Payer: BC Managed Care – PPO | Attending: Psychiatry | Admitting: Psychiatry

## 2013-10-05 ENCOUNTER — Encounter (HOSPITAL_COMMUNITY): Payer: Self-pay | Admitting: Emergency Medicine

## 2013-10-05 ENCOUNTER — Encounter (HOSPITAL_COMMUNITY): Payer: Self-pay

## 2013-10-05 DIAGNOSIS — F1010 Alcohol abuse, uncomplicated: Secondary | ICD-10-CM

## 2013-10-05 DIAGNOSIS — K219 Gastro-esophageal reflux disease without esophagitis: Secondary | ICD-10-CM | POA: Diagnosis present

## 2013-10-05 DIAGNOSIS — F1910 Other psychoactive substance abuse, uncomplicated: Secondary | ICD-10-CM

## 2013-10-05 DIAGNOSIS — F332 Major depressive disorder, recurrent severe without psychotic features: Secondary | ICD-10-CM | POA: Diagnosis present

## 2013-10-05 DIAGNOSIS — F4324 Adjustment disorder with disturbance of conduct: Secondary | ICD-10-CM | POA: Diagnosis present

## 2013-10-05 DIAGNOSIS — B182 Chronic viral hepatitis C: Secondary | ICD-10-CM | POA: Diagnosis present

## 2013-10-05 DIAGNOSIS — F411 Generalized anxiety disorder: Secondary | ICD-10-CM | POA: Diagnosis present

## 2013-10-05 DIAGNOSIS — Z8249 Family history of ischemic heart disease and other diseases of the circulatory system: Secondary | ICD-10-CM

## 2013-10-05 DIAGNOSIS — F1120 Opioid dependence, uncomplicated: Principal | ICD-10-CM | POA: Diagnosis present

## 2013-10-05 DIAGNOSIS — Z598 Other problems related to housing and economic circumstances: Secondary | ICD-10-CM

## 2013-10-05 DIAGNOSIS — I872 Venous insufficiency (chronic) (peripheral): Secondary | ICD-10-CM | POA: Diagnosis present

## 2013-10-05 DIAGNOSIS — Z5987 Material hardship due to limited financial resources, not elsewhere classified: Secondary | ICD-10-CM

## 2013-10-05 DIAGNOSIS — F1020 Alcohol dependence, uncomplicated: Secondary | ICD-10-CM | POA: Diagnosis present

## 2013-10-05 DIAGNOSIS — Z823 Family history of stroke: Secondary | ICD-10-CM

## 2013-10-05 DIAGNOSIS — F1994 Other psychoactive substance use, unspecified with psychoactive substance-induced mood disorder: Secondary | ICD-10-CM | POA: Diagnosis present

## 2013-10-05 DIAGNOSIS — Z87891 Personal history of nicotine dependence: Secondary | ICD-10-CM

## 2013-10-05 LAB — RAPID URINE DRUG SCREEN, HOSP PERFORMED
AMPHETAMINES: POSITIVE — AB
Barbiturates: NOT DETECTED
Benzodiazepines: NOT DETECTED
COCAINE: NOT DETECTED
OPIATES: POSITIVE — AB
TETRAHYDROCANNABINOL: NOT DETECTED

## 2013-10-05 MED ORDER — DICYCLOMINE HCL 20 MG PO TABS
20.0000 mg | ORAL_TABLET | Freq: Four times a day (QID) | ORAL | Status: DC | PRN
  Administered 2013-10-06 – 2013-10-09 (×2): 20 mg via ORAL
  Filled 2013-10-05 (×2): qty 1

## 2013-10-05 MED ORDER — CHLORDIAZEPOXIDE HCL 25 MG PO CAPS: 25.0000 mg | ORAL_CAPSULE | Freq: Four times a day (QID) | ORAL | Status: DC | PRN

## 2013-10-05 MED ORDER — CHLORDIAZEPOXIDE HCL 25 MG PO CAPS: 25.0000 mg | ORAL_CAPSULE | Freq: Every day | ORAL | Status: DC

## 2013-10-05 MED ORDER — VITAMIN B-1 100 MG PO TABS: 100.0000 mg | ORAL_TABLET | Freq: Every day | ORAL | Status: DC

## 2013-10-05 MED ORDER — CHLORDIAZEPOXIDE HCL 25 MG PO CAPS: 25.0000 mg | ORAL_CAPSULE | Freq: Four times a day (QID) | ORAL | Status: DC

## 2013-10-05 MED ORDER — CHLORDIAZEPOXIDE HCL 25 MG PO CAPS: 25.0000 mg | ORAL_CAPSULE | Freq: Three times a day (TID) | ORAL | Status: DC

## 2013-10-05 MED ORDER — METHOCARBAMOL 500 MG PO TABS: 500.0000 mg | ORAL_TABLET | Freq: Three times a day (TID) | ORAL | Status: DC | PRN

## 2013-10-05 MED ORDER — TRAZODONE HCL 50 MG PO TABS
150.0000 mg | ORAL_TABLET | Freq: Every day | ORAL | Status: DC
  Filled 2013-10-05: qty 3

## 2013-10-05 MED ORDER — LOPERAMIDE HCL 2 MG PO CAPS
2.0000 mg | ORAL_CAPSULE | ORAL | Status: DC | PRN
  Administered 2013-10-08 (×2): 2 mg via ORAL
  Administered 2013-10-08: 4 mg via ORAL
  Administered 2013-10-08 – 2013-10-09 (×2): 2 mg via ORAL
  Filled 2013-10-05 (×2): qty 2
  Filled 2013-10-05 (×3): qty 1

## 2013-10-05 MED ORDER — TRAZODONE HCL 150 MG PO TABS
150.0000 mg | ORAL_TABLET | Freq: Every day | ORAL | Status: DC
  Administered 2013-10-05 – 2013-10-08 (×4): 150 mg via ORAL
  Filled 2013-10-05 (×3): qty 1
  Filled 2013-10-05: qty 4
  Filled 2013-10-05 (×2): qty 1

## 2013-10-05 MED ORDER — LORAZEPAM 1 MG PO TABS
1.0000 mg | ORAL_TABLET | Freq: Once | ORAL | Status: AC
  Administered 2013-10-05: 1 mg via ORAL
  Filled 2013-10-05: qty 1

## 2013-10-05 MED ORDER — CHLORDIAZEPOXIDE HCL 25 MG PO CAPS: 25.0000 mg | ORAL_CAPSULE | ORAL | Status: DC

## 2013-10-05 MED ORDER — HYDROXYZINE HCL 25 MG PO TABS: 25.0000 mg | ORAL_TABLET | Freq: Four times a day (QID) | ORAL | Status: DC | PRN

## 2013-10-05 MED ORDER — CEPHALEXIN 500 MG PO CAPS: 500.0000 mg | ORAL_CAPSULE | Freq: Four times a day (QID) | ORAL | Status: DC

## 2013-10-05 MED ORDER — MAGNESIUM HYDROXIDE 400 MG/5ML PO SUSP: 30.0000 mL | Freq: Every day | ORAL | Status: DC | PRN

## 2013-10-05 MED ORDER — HYDROXYZINE HCL 25 MG PO TABS
25.0000 mg | ORAL_TABLET | Freq: Four times a day (QID) | ORAL | Status: DC | PRN
  Administered 2013-10-06 – 2013-10-09 (×4): 25 mg via ORAL
  Filled 2013-10-05 (×4): qty 1

## 2013-10-05 MED ORDER — DICYCLOMINE HCL 20 MG PO TABS
20.0000 mg | ORAL_TABLET | Freq: Four times a day (QID) | ORAL | Status: DC | PRN
  Administered 2013-10-05: 20 mg via ORAL
  Filled 2013-10-05: qty 1

## 2013-10-05 MED ORDER — PANTOPRAZOLE SODIUM 40 MG PO TBEC
40.0000 mg | DELAYED_RELEASE_TABLET | Freq: Every day | ORAL | Status: DC
  Filled 2013-10-05: qty 1

## 2013-10-05 MED ORDER — NAPROXEN 500 MG PO TABS
500.0000 mg | ORAL_TABLET | Freq: Two times a day (BID) | ORAL | Status: DC | PRN
  Administered 2013-10-06 (×2): 500 mg via ORAL
  Filled 2013-10-05 (×2): qty 1

## 2013-10-05 MED ORDER — CLONIDINE HCL 0.1 MG PO TABS
0.1000 mg | ORAL_TABLET | Freq: Every day | ORAL | Status: DC
  Administered 2013-10-09: 0.1 mg via ORAL
  Filled 2013-10-05 (×2): qty 1

## 2013-10-05 MED ORDER — THIAMINE HCL 100 MG/ML IJ SOLN
100.0000 mg | Freq: Once | INTRAMUSCULAR | Status: AC
  Administered 2013-10-05: 100 mg via INTRAMUSCULAR
  Filled 2013-10-05: qty 2

## 2013-10-05 MED ORDER — CLONIDINE HCL 0.1 MG PO TABS
0.1000 mg | ORAL_TABLET | Freq: Four times a day (QID) | ORAL | Status: DC
  Administered 2013-10-05: 0.1 mg via ORAL
  Filled 2013-10-05: qty 1

## 2013-10-05 MED ORDER — CLONIDINE HCL 0.1 MG PO TABS: 0.1000 mg | ORAL_TABLET | Freq: Every day | ORAL | Status: DC

## 2013-10-05 MED ORDER — CHLORDIAZEPOXIDE HCL 25 MG PO CAPS
25.0000 mg | ORAL_CAPSULE | Freq: Once | ORAL | Status: AC
  Administered 2013-10-05: 25 mg via ORAL
  Filled 2013-10-05: qty 1

## 2013-10-05 MED ORDER — CLONIDINE HCL 0.2 MG PO TABS
0.2000 mg | ORAL_TABLET | Freq: Three times a day (TID) | ORAL | Status: DC
Start: 2013-10-05 — End: 2013-10-05
  Filled 2013-10-05: qty 1

## 2013-10-05 MED ORDER — NAPROXEN 250 MG PO TABS: 500.0000 mg | ORAL_TABLET | Freq: Two times a day (BID) | ORAL | Status: DC | PRN

## 2013-10-05 MED ORDER — ALUM & MAG HYDROXIDE-SIMETH 200-200-20 MG/5ML PO SUSP
30.0000 mL | ORAL | Status: DC | PRN
  Administered 2013-10-08: 30 mL via ORAL

## 2013-10-05 MED ORDER — CHLORDIAZEPOXIDE HCL 25 MG PO CAPS
25.0000 mg | ORAL_CAPSULE | Freq: Four times a day (QID) | ORAL | Status: AC | PRN
  Administered 2013-10-06: 25 mg via ORAL
  Filled 2013-10-05: qty 1

## 2013-10-05 MED ORDER — PANTOPRAZOLE SODIUM 40 MG PO TBEC
40.0000 mg | DELAYED_RELEASE_TABLET | Freq: Every day | ORAL | Status: DC
  Administered 2013-10-05 – 2013-10-09 (×6): 40 mg via ORAL
  Filled 2013-10-05 (×8): qty 1

## 2013-10-05 MED ORDER — METHOCARBAMOL 500 MG PO TABS
500.0000 mg | ORAL_TABLET | Freq: Three times a day (TID) | ORAL | Status: DC | PRN
  Administered 2013-10-06 – 2013-10-08 (×3): 500 mg via ORAL
  Filled 2013-10-05 (×3): qty 1

## 2013-10-05 MED ORDER — ADULT MULTIVITAMIN W/MINERALS CH
1.0000 | ORAL_TABLET | Freq: Every day | ORAL | Status: DC
  Administered 2013-10-05 – 2013-10-09 (×5): 1 via ORAL
  Filled 2013-10-05 (×8): qty 1

## 2013-10-05 MED ORDER — VITAMIN B-1 100 MG PO TABS
100.0000 mg | ORAL_TABLET | Freq: Every day | ORAL | Status: DC
  Administered 2013-10-06 – 2013-10-09 (×4): 100 mg via ORAL
  Filled 2013-10-05 (×6): qty 1

## 2013-10-05 MED ORDER — ACETAMINOPHEN 325 MG PO TABS: 650.0000 mg | ORAL_TABLET | Freq: Four times a day (QID) | ORAL | Status: DC | PRN

## 2013-10-05 MED ORDER — ONDANSETRON 4 MG PO TBDP: 4.0000 mg | ORAL_TABLET | Freq: Four times a day (QID) | ORAL | Status: DC | PRN

## 2013-10-05 MED ORDER — ADULT MULTIVITAMIN W/MINERALS CH: 1.0000 | ORAL_TABLET | Freq: Every day | ORAL | Status: DC

## 2013-10-05 MED ORDER — CLONIDINE HCL 0.1 MG PO TABS
0.1000 mg | ORAL_TABLET | Freq: Four times a day (QID) | ORAL | Status: AC
  Administered 2013-10-05 (×2): 0.1 mg via ORAL
  Filled 2013-10-05 (×8): qty 1

## 2013-10-05 MED ORDER — LOPERAMIDE HCL 2 MG PO CAPS: 2.0000 mg | ORAL_CAPSULE | ORAL | Status: DC | PRN

## 2013-10-05 MED ORDER — CLONIDINE HCL 0.1 MG PO TABS: 0.1000 mg | ORAL_TABLET | ORAL | Status: DC

## 2013-10-05 MED ORDER — ONDANSETRON 4 MG PO TBDP
4.0000 mg | ORAL_TABLET | Freq: Four times a day (QID) | ORAL | Status: DC | PRN
  Administered 2013-10-06: 4 mg via ORAL
  Filled 2013-10-05 (×3): qty 1

## 2013-10-05 MED ORDER — CHLORDIAZEPOXIDE HCL 25 MG PO CAPS
25.0000 mg | ORAL_CAPSULE | Freq: Four times a day (QID) | ORAL | Status: DC
  Administered 2013-10-05: 25 mg via ORAL
  Filled 2013-10-05: qty 1

## 2013-10-05 MED ORDER — CLONIDINE HCL 0.1 MG PO TABS
0.1000 mg | ORAL_TABLET | ORAL | Status: AC
  Administered 2013-10-07 – 2013-10-08 (×3): 0.1 mg via ORAL
  Filled 2013-10-05 (×4): qty 1

## 2013-10-05 MED ORDER — CEPHALEXIN 500 MG PO CAPS
500.0000 mg | ORAL_CAPSULE | Freq: Four times a day (QID) | ORAL | Status: DC
  Administered 2013-10-05: 250 mg via ORAL
  Administered 2013-10-05 – 2013-10-08 (×9): 500 mg via ORAL
  Filled 2013-10-05: qty 1
  Filled 2013-10-05: qty 20
  Filled 2013-10-05 (×4): qty 1
  Filled 2013-10-05: qty 2
  Filled 2013-10-05 (×3): qty 1
  Filled 2013-10-05: qty 20
  Filled 2013-10-05 (×5): qty 1
  Filled 2013-10-05: qty 2
  Filled 2013-10-05 (×3): qty 1
  Filled 2013-10-05: qty 2
  Filled 2013-10-05 (×2): qty 20
  Filled 2013-10-05 (×3): qty 1

## 2013-10-05 NOTE — BH Assessment (Signed)
Received call for assessment. Spoke to [name], NP who said Pt is looking for detox only, not residential treatment. Tele-assessment will be initiated.  Harlin RainFord Ellis Ria CommentWarrick Jr, LPC, [facility] Triage Specialist [phone]

## 2013-10-05 NOTE — BH Assessment (Signed)
Assessment complete. [facility] Powell[facility] bed [facility]. Gave clinical report to [name], NP who accepted Pt to the service of [name], room 307-2 after 0730. Notified [name], NP and [name], RN of acceptance.  Harlin RainFord Ellis Ria CommentWarrick Jr, LPC, [facility] Triage Specialist [phone]

## 2013-10-05 NOTE — ED Notes (Signed)
[facility] Patient [facility] and will have bed after 7am. Pt. Wanting medicine tonight for detox symptoms. Pt. Appears in no distress at this time. NP notified.

## 2013-10-05 NOTE — ED Notes (Signed)
Went in to patient room to give medicine and patient in asleep and snoring.

## 2013-10-05 NOTE — [facility] Suicide Risk Assessment (Signed)
Suicide Risk Assessment  Admission Assessment     Nursing information obtained from:  Patient Demographic factors:  Male Current Mental Status:  Self-harm behaviors Loss Factors:  NA Historical Factors:  Family history of mental illness or substance abuse Risk Reduction Factors:  Sense of responsibility to family;Religious beliefs about death;Living with another person, especially a relative;Positive social support Total Time spent with patient: 45 minutes  CLINICAL FACTORS:   Alcohol/Substance Abuse/Dependencies More than one psychiatric diagnosis  Psychiatric Specialty Exam:     Blood pressure 111/69, pulse 94, temperature 97.9 F (36.6 C), temperature source Oral, resp. rate 16, height 5\' 11"  (1.803 m), weight 78.812 kg (173 lb 12 oz), SpO2 99.00%.Body mass index is 24.24 kg/(m^2).  General Appearance: Casual  Eye Contact::  Minimal  Speech:  Slow  Volume:  Decreased  Mood:  Dysphoric  Affect:  Congruent  Thought Process:  Linear  Orientation:  Full (Time, Place, and Person)  Thought Content:  Rumination  Suicidal Thoughts:  No  Homicidal Thoughts:  No  Memory:  Recent;   Fair  Judgement:  Impaired  Insight:  Shallow  Psychomotor Activity:  Decreased  Concentration:  Poor  Recall:  [employer]Fair  Fund of Knowledge:Fair  Language: Fair  Akathisia:  Negative  Handed:  Right  AIMS (if indicated):     Assets:  Leisure Time  Sleep:      Musculoskeletal: Strength & Muscle Tone: within normal limits Gait & Station: in bed Patient leans: N/A  COGNITIVE FEATURES THAT CONTRIBUTE TO RISK:  Closed-mindedness Polarized thinking    SUICIDE RISK:   Mild:  Suicidal ideation of limited frequency, intensity, duration, and specificity.  There are no identifiable plans, no associated intent, mild dysphoria and related symptoms, good self-control (both objective and subjective assessment), few other risk factors, and identifiable protective factors, including available and accessible  social support.  PLAN OF CARE:  I certify that inpatient services furnished can reasonably be expected to improve the patient's condition.  Thresa RossNadeem Sloane Palmer 10/05/2013, 12:05 PM

## 2013-10-05 NOTE — ED Notes (Signed)
Belongings inventoried

## 2013-10-05 NOTE — BH Assessment (Signed)
Tele Assessment Note   Bryan Powell is an 63 y.o. male, married, African-American who presents to [facility] ED initially accompanied by his wife, who was no present during assessment. Pt reports he is seeking detox from heroin and alcohol. He states he is part of a drug court program and saw his court counselor 10/04/13. Pt is to enter an outpatient substance abuse program but first needs detox. He reports using 1/2 gram of heroin intravenously daily for the past four months. He has also bee drinking at least two pints of "cheap wine" daily for four weeks. Pt states he has had substance abuse problems periodically throughout his life. He reports his longest period of sobriety was from 2006-2012. He reports withdrawal symptoms when he stops using including tremors, chills, sweats, nausea, diarrhea and muscle cramps. He denies any history of seizures. He denies currently using any other substances. He denies current suicidal ideation or any history of suicide attempts. He denies any intentional self-injurious behaviors. He denies any homicidal ideation or history of violence. He denies any psychotic symptoms.  Pt identifies his primary stressor as legal issues related to his substance abuse. He is living with his wife who is sober and supportive of Pt's treatment. He also has an adult daughter who is supportive. Pt report his last treatment was at [facility] in summer of 2014.   Pt is dressed in a hospital gown, alert and oriented x4. Eye contact is good. Mood is anxious and affect is congruent with mood. Thought process is coherent and goal directed. There is no evidence of Pt responding to internal stimuli or experiencing delusional thought content. Pt was cooperative throughout assessment. He appears motivated for treatment.  Axis I: 304.00 Opioid Use Disorder, Severe; 303.90 Alcohol Use Disorder, Severe Axis II: Deferred Axis III:  Past Medical History  Diagnosis Date  . Reflux   . Lower extremity  venous stasis   . Genital herpes   . GERD (gastroesophageal reflux disease)   . Hep C w/o coma, chronic   . Anxiety   . Depression   . ED (erectile dysfunction)    Axis IV: problems related to legal system/crime Axis V: GAF=35  Past Medical History:  Past Medical History  Diagnosis Date  . Reflux   . Lower extremity venous stasis   . Genital herpes   . GERD (gastroesophageal reflux disease)   . Hep C w/o coma, chronic   . Anxiety   . Depression   . ED (erectile dysfunction)     Past Surgical History  Procedure Laterality Date  . Tonsillectomy    . Orbital fracture surgery    . Cholecystectomy    . I&d extremity  06/09/2012    Procedure: IRRIGATION AND DEBRIDEMENT EXTREMITY;  Surgeon: Sharma Covert, MD;  Location: [facility] OR;  Service: Orthopedics;  Laterality: Left;    Family History:  Family History  Problem Relation Age of Onset  . Stroke Mother   . Heart disease Father     Social History:  reports that he has quit smoking. He has never used smokeless tobacco. He reports that he drinks alcohol. He reports that he uses illicit drugs (IV and Heroin).  Additional Social History:  Alcohol / Drug Use Pain Medications: Denies current use Prescriptions: Denies abuse Over the Counter: Denies abuse History of alcohol / drug use?: Yes Longest period of sobriety (when/how long): 6 years, 2006-2012 Negative Consequences of Use: Financial;Legal;Personal relationships;Work / School Withdrawal Symptoms: Diarrhea;Nausea / Vomiting;Sweats;Fever / Chills;Cramps;Tremors Substance #  1 Name of Substance 1: Heroin, intravenous 1 - Age of First Use: 17 1 - Amount (size/oz): 1/2 gram 1 - Frequency: daily 1 - Duration: Four months 1 - Last Use / Amount: 10/04/13, 1/2 gram Substance #2 Name of Substance 2: Alcohol 2 - Age of First Use: 15 2 - Amount (size/oz): 2 pints wine 2 - Frequency: daily 2 - Duration: Four months 2 - Last Use / Amount: 10/04/13, 2 pints wine  CIWA:  CIWA-Ar BP: 122/73 mmHg Pulse Rate: 95 COWS: Clinical Opiate Withdrawal Scale (COWS) Resting Pulse Rate: Pulse Rate 81-100 Sweating: No report of chills or flushing Restlessness: Reports difficulty sitting still, but is able to do so Pupil Size: Pupils pinned or normal size for room light Bone or Joint Aches: Patient reports sever diffuse aching of joints/muscles Runny Nose or Tearing: Not present GI Upset: No GI symptoms Tremor: Slight tremor observable Yawning: No yawning Anxiety or Irritability: None Gooseflesh Skin: Skin is smooth COWS Total Score: 6  Allergies: No Known Allergies  Home Medications:  (Not in a hospital admission)  OB/GYN Status:  No LMP for male patient.  General Assessment Data [facility] Is this a Tele or Face-to-Face Assessment?: Tele Assessment Is this an Initial Assessment or a Re-assessment for this encounter?: Initial Assessment Living Arrangements: Spouse/significant other Can pt return to current living arrangement?: Yes Admission Status: Voluntary Is patient capable of signing voluntary admission?: Yes Transfer from: Acute Hospital Referral Source: Self/Family/Friend     [facility] Living Arrangements: Spouse/significant other Name of Psychiatrist: None Name of Therapist: None  Education Status Is patient currently in school?: No Current Grade: NA Highest grade of school patient has completed: NA Name of school: NA Contact person: NA  Risk to self Suicidal Ideation: No Suicidal Intent: No Is patient at risk for suicide?: No Suicidal Plan?: No Access to Means: No What has been your use of drugs/alcohol within the last 12 months?: Using heroin and alcohol daily Previous Attempts/Gestures: No How many times?: 0 Other Self Harm Risks: None Triggers for Past Attempts: None known Intentional Self Injurious Behavior: None Family Suicide History: No Recent stressful life event(s): Legal  Issues Persecutory voices/beliefs?: No Depression: No Depression Symptoms: Feeling angry/irritable Substance abuse history and/or treatment for substance abuse?: Yes Suicide prevention information given to non-admitted patients: Not applicable  Risk to Others Homicidal Ideation: No Thoughts of Harm to Others: No Current Homicidal Intent: No Current Homicidal Plan: No Access to Homicidal Means: No Identified Victim: None History of harm to others?: No Assessment of Violence: None Noted Violent Behavior Description: None Does patient have access to weapons?: No Criminal Charges Pending?: No Does patient have a court date: Yes (Drug court) Court Date: 10/21/13  Psychosis Hallucinations: None noted Delusions: None noted  Mental Status Report Appear/Hygiene: Other (Comment) [facility](Hospital gown) Eye Contact: Good Motor Activity: Unremarkable Speech: Logical/coherent Level of Consciousness: Alert Mood: Guilty Affect: Appropriate to circumstance Anxiety Level: Minimal Thought Processes: Coherent;Relevant Judgement: Unimpaired Orientation: Person;Place;Time;Situation Obsessive Compulsive Thoughts/Behaviors: None  Cognitive Functioning Concentration: Normal Memory: Recent Intact;Remote Intact IQ: Average Insight: Good Impulse Control: Good Appetite: Fair Weight Loss: 5 Weight Gain: 0 Sleep: Decreased Total Hours of Sleep: 6 Vegetative Symptoms: None  ADLScreening [facility] Assessment Services) Patient's cognitive ability adequate to safely complete daily activities?: Yes Patient able to express need for assistance with ADLs?: Yes Independently performs ADLs?: Yes (appropriate for developmental age)  Prior Inpatient Therapy Prior Inpatient Therapy: Yes Prior Therapy Dates: 11/2012; multiple admits  [facility] Reason for Treatment: Opioid and alcohol use  Prior Outpatient Therapy Prior Outpatient Therapy: Yes Prior Therapy Dates:  Ongoing Prior Therapy Facilty/Provider([facility]): [facility] Reason for Treatment: Substance abuse  ADL Screening (condition at time of admission) Patient's cognitive ability adequate to safely complete daily activities?: Yes Is the patient deaf or have difficulty hearing?: No Does the patient have difficulty seeing, even when wearing glasses/contacts?: No Does the patient have difficulty concentrating, remembering, or making decisions?: No Patient able to express need for assistance with ADLs?: Yes Does the patient have difficulty dressing or bathing?: No Independently performs ADLs?: Yes (appropriate for developmental age) Does the patient have difficulty walking or climbing stairs?: No Weakness of Legs: None Weakness of Arms/Hands: None  Home Assistive Devices/Equipment Home Assistive Devices/Equipment: None    Abuse/Neglect Assessment (Assessment to be complete while patient is alone) Physical Abuse: Denies Verbal Abuse: Denies Sexual Abuse: Denies Exploitation of patient/patient's resources: Denies Self-Neglect: Denies Values / Beliefs Cultural Requests During Hospitalization: None Spiritual Requests During Hospitalization: None   Advance Directives (For Healthcare) Advance Directive: Patient does not have advance directive;Patient would not like information Pre-existing out of facility DNR order (yellow form or pink MOST form): No Nutrition Screen- [facility] Adult/[facility]/AP Patient's home diet: Regular  Additional Information 1:1 In Past 12 Months?: No CIRT Risk: No Elopement Risk: No Does patient have medical clearance?: Yes     Disposition:  Binnie RailJoann Glover, [facility], confirmed bed availability. Gave clinical report to [name], NP who accepted Pt to the service of [name], room 307-2 after 0730. Notified [name], NP and [name], RN of acceptance.  Disposition Initial Assessment Completed for this Encounter: Yes Disposition of Patient: Inpatient treatment  program Type of inpatient treatment program: Adult  Pamalee LeydenFord Ellis Cherylin Waguespack Jr, [facility], [facility] Triage Specialist [phone]   Harlin RainFord Ellis Patsy BaltimoreWarrick Jr. 10/05/2013 2:04 AM

## 2013-10-05 NOTE — Progress Notes (Signed)
D) This is a 63 year old AAM admitted voluntarily to the service of Dr. Dub MikesLugo. Pt states that he has been injecting and snorting Heroine daily for the last 6 months, using 1/2Gm to 1 GM daily.  Pt also reports that he also has been drinking 2 pints of wine daily for the last three months. When asked why he is choosing to detox now Pt replies "I am just tired of it". Pt has a history of Cellulitis on both feet and wears embolitic stockings and has poor circulation in both of his hands. Pt's legs are pink but there is no drainage or present swelling to the area. Pt also has a history of reflux, venous status, genital herpes, Hep B, anxiety and depression. Pt cooperative throughout the admission process. Pt was also taking two types of antibiotics for his cellulitis of both legs but was not taking is as he was suppose to take them. Denies SI and HI, auditory or visual hallucinations. A) Given support, reassurance and praise. Encouragement provided. Oriented to the unit and to the rules. Allowed to rest, stating that he had been up all night.  R. Denies SI and HI. Adjusting to the unit.

## 2013-10-05 NOTE — ED Notes (Signed)
Pt. wanded by security, changed into paper scrubs.

## 2013-10-05 NOTE — Progress Notes (Signed)
Patient did not attend AA meeting. Tonight we had a speaker meeting. Today was his first full day. He slept through the meeting and the entire evening.  [name] A [name] 11:59 PM

## 2013-10-05 NOTE — H&P (Signed)
Psychiatric Admission Assessment Adult  Patient Identification:  Bryan Powell Date of Evaluation:  10/05/2013 Chief Complaint:  Opioid Use Disorder Alcohol Use Disorder History of Present Illness::  Bryan Powell is an 63 y.o. male, married, African-American who presents to [facility]  accompanied by his wife.    Pt reports he is seeking detox from heroin and alcohol. He states he is part of a drug court program and saw his court counselor 10/04/13. Pt is to enter an outpatient substance abuse program but first needs detox. He reports using 1/2 gram of heroin intravenously daily for the past four months. He has also been drinking at least two pints of "cheap wine" daily for four weeks. Pt states he has had substance abuse problems periodically throughout his life. He reports his longest period of sobriety was from 2006-2012. He reports withdrawal symptoms when he stops using including tremors, chills, sweats, nausea, diarrhea and muscle cramps.  He denies any history of seizures. He denies currently using any other substances. He denies current suicidal ideation or any history of suicide attempts. He denies any intentional self-injurious behaviors. He denies any homicidal ideation or history of violence. He denies any psychotic symptoms.   Pt identifies his primary stressor as legal issues related to his substance abuse.  He is living with his wife who is sober and supportive of Pt's treatment. He also has an adult daughter who is supportive. Pt report his last treatment was at [facility] in summer of 2014.   He is currently lethargic and it takes constant stimulation to gather a histroy  Elements:  Location:  Heroin and alcohol dependence. Quality:  lethargic, irritated . Severity:  severe. Timing:  years. Duration:  chronic. Context:  psycho-social stressors. Associated Signs/Synptoms: Depression Symptoms:  depressed mood, hopelessness, anxiety,      Total Time spent with  patient:25 min  Psychiatric Specialty Exam: Physical Exam  Constitutional: He is oriented to person, place, and time. He appears well-developed.  HENT:  Head: Normocephalic.  Mouth/Throat: Oropharyngeal exudate present.  Neck: Normal range of motion.  Genitourinary:  deferred  Musculoskeletal: Normal range of motion.  Neurological: He is alert and oriented to person, place, and time.  Lethargic, oreitnedX3  Skin: Skin is warm and dry.  Both hands warm, good capillary refill yet appear slightly cyanotic (patietn reports this is baseline ) from years of IV use   Buttocks with noted injection sites and multiple areas of bruising    ROS  Blood pressure 111/69, pulse 94, temperature 97.9 F (36.6 C), temperature source Oral, resp. rate 16, height $RemoveBe'5\' 11"'fvpNCKXuc$  (1.803 m), weight 78.812 kg (173 lb 12 oz), SpO2 99.00%.Body mass index is 24.24 kg/(m^2).  General Appearance: Disheveled  Eye Contact::  Poor  Speech:  Garbled  Volume:  Decreased  Mood:  Depressed and Irritable  Affect:  Flat  Thought Process:  Disorganized  Orientation:  Full (Time, Place, and Person)  Thought Content:  NA  Suicidal Thoughts:  No  Homicidal Thoughts:  No  Memory:  Immediate;   Poor Recent;   Poor Remote;   Poor  Judgement:  Poor  Insight:  Lacking  Psychomotor Activity:  Decreased  Concentration:  Poor  Recall:  Poor  Fund of Knowledge:Poor  Language: Poor  Akathisia:  Negative  Handed:  Right  AIMS (if indicated):     Assets:  Resilience Social Support  Sleep:       Musculoskeletal: Strength & Muscle Tone: within normal limits Gait & Station:  normal Patient leans: N/A  Past Psychiatric History: Diagnosis:Opioid/alcohol dependande  Hospitalizations:yes  Outpatient Care:yes  Substance Abuse Care: yes  Self-Mutilation:none  Suicidal Attempts:  Violent Behaviors:none   Past Medical History:   Past Medical History  Diagnosis Date  . Reflux   . Lower extremity venous stasis   . Genital  herpes   . GERD (gastroesophageal reflux disease)   . Hep C w/o coma, chronic   . Anxiety   . Depression   . ED (erectile dysfunction)   . Heroin abuse   . Alcohol abuse    None. Allergies:  No Known Allergies PTA Medications: Prescriptions prior to admission  Medication Sig Dispense Refill  . cephALEXin (KEFLEX) 500 MG capsule Take 1 capsule (500 mg total) by mouth 4 (four) times daily.  28 capsule  0  . esomeprazole (NEXIUM) 20 MG capsule Take 20 mg by mouth daily at 12 noon.      . Multiple Vitamin (MULTIVITAMIN WITH MINERALS) TABS Take 1 tablet by mouth daily.      Marland Kitchen sulfamethoxazole-trimethoprim (BACTRIM DS) 800-160 MG per tablet Take 1 tablet by mouth 2 (two) times daily.  14 tablet  0  . traZODone (DESYREL) 100 MG tablet Take 150 mg by mouth at bedtime.        Previous Psychotropic Medications:  Medication/Dose                 Substance Abuse History in the last 12 months:  yes  Consequences of Substance Abuse: Medical Consequences:  skin infections Legal Consequences:  court Family Consequences:  stresses on family  Social History:  reports that he has quit smoking. He has never used smokeless tobacco. He reports that he drinks alcohol. He reports that he uses illicit drugs (IV and Heroin). Additional Social History:                      Current Place of Residence:   Place of Birth:   Family Members: Marital Status:  Married Children:  Sons:  Daughters: one Relationships: Education:  HS Soil scientist Problems/Performance: Religious Beliefs/Practices: History of Abuse (Emotional/Phsycial/Sexual) Ship broker History:  None. Legal History: yes Hobbies/Interests:  Family History:   Family History  Problem Relation Age of Onset  . Stroke Mother   . Heart disease Father     Results for orders placed during the hospital encounter of 10/04/13 (from the past 72 hour(s))  ACETAMINOPHEN LEVEL     Status: None    Collection Time    10/04/13  6:38 PM      Result Value Ref Range   Acetaminophen (Tylenol), Serum <15.0  10 - 30 ug/mL   Comment:            THERAPEUTIC CONCENTRATIONS VARY     SIGNIFICANTLY. A RANGE OF 10-30     ug/mL MAY BE AN EFFECTIVE     CONCENTRATION FOR MANY PATIENTS.     HOWEVER, SOME ARE BEST TREATED     AT CONCENTRATIONS OUTSIDE THIS     RANGE.     ACETAMINOPHEN CONCENTRATIONS     >150 ug/mL AT 4 HOURS AFTER     INGESTION AND >50 ug/mL AT 12     HOURS AFTER INGESTION ARE     OFTEN ASSOCIATED WITH TOXIC     REACTIONS.  CBC     Status: None   Collection Time    10/04/13  6:38 PM      Result Value Ref Range  WBC 6.8  4.0 - 10.5 K/uL   RBC 5.29  4.22 - 5.81 MIL/uL   Hemoglobin 15.1  13.0 - 17.0 g/dL   HCT 44.7  39.0 - 52.0 %   MCV 84.5  78.0 - 100.0 fL   MCH 28.5  26.0 - 34.0 pg   MCHC 33.8  30.0 - 36.0 g/dL   RDW 14.3  11.5 - 15.5 %   Platelets 190  150 - 400 K/uL  COMPREHENSIVE METABOLIC PANEL     Status: Abnormal   Collection Time    10/04/13  6:38 PM      Result Value Ref Range   Sodium 141  137 - 147 mEq/L   Potassium 3.6 (*) 3.7 - 5.3 mEq/L   Chloride 103  96 - 112 mEq/L   CO2 23  19 - 32 mEq/L   Glucose, Bld 143 (*) 70 - 99 mg/dL   BUN 7  6 - 23 mg/dL   Creatinine, Ser 0.58  0.50 - 1.35 mg/dL   Calcium 9.3  8.4 - 10.5 mg/dL   Total Protein 7.7  6.0 - 8.3 g/dL   Albumin 3.9  3.5 - 5.2 g/dL   AST 24  0 - 37 U/L   ALT 25  0 - 53 U/L   Alkaline Phosphatase 67  39 - 117 U/L   Total Bilirubin 0.5  0.3 - 1.2 mg/dL   GFR calc non Af Amer >90  >90 mL/min   GFR calc Af Amer >90  >90 mL/min   Comment: (NOTE)     The eGFR has been calculated using the CKD EPI equation.     This calculation has not been validated in all clinical situations.     eGFR's persistently <90 mL/min signify possible Chronic Kidney     Disease.  ETHANOL     Status: Abnormal   Collection Time    10/04/13  6:38 PM      Result Value Ref Range   Alcohol, Ethyl (B) 53 (*) 0 - 11 mg/dL    Comment:            LOWEST DETECTABLE LIMIT FOR     SERUM ALCOHOL IS 11 mg/dL     FOR MEDICAL PURPOSES ONLY  SALICYLATE LEVEL     Status: Abnormal   Collection Time    10/04/13  6:38 PM      Result Value Ref Range   Salicylate Lvl <3.8 (*) 2.8 - 20.0 mg/dL  URINE RAPID DRUG SCREEN (HOSP PERFORMED)     Status: Abnormal   Collection Time    10/05/13  1:42 AM      Result Value Ref Range   Opiates POSITIVE (*) NONE DETECTED   Cocaine NONE DETECTED  NONE DETECTED   Benzodiazepines NONE DETECTED  NONE DETECTED   Amphetamines POSITIVE (*) NONE DETECTED   Tetrahydrocannabinol NONE DETECTED  NONE DETECTED   Barbiturates NONE DETECTED  NONE DETECTED   Comment:            DRUG SCREEN FOR MEDICAL PURPOSES     ONLY.  IF CONFIRMATION IS NEEDED     FOR ANY PURPOSE, NOTIFY LAB     WITHIN 5 DAYS.                LOWEST DETECTABLE LIMITS     FOR URINE DRUG SCREEN     Drug Class       Cutoff (ng/mL)     Amphetamine      1000  Barbiturate      200     Benzodiazepine   397     Tricyclics       673     Opiates          300     Cocaine          300     THC              50   Psychological Evaluations:  Assessment:   DSM5:  Substance/Addictive Disorders:  Opioid Disorder - Severe (304.00) Depressive Disorders:  Major Depressive Disorder - Severe (296.23)  AXIS I:  Major Depression, Recurrent severe and Substance Abuse. Alcohol and other substances use disorder. AXIS II:  Deferred AXIS III:   Past Medical History  Diagnosis Date  . Reflux   . Lower extremity venous stasis   . Genital herpes   . GERD (gastroesophageal reflux disease)   . Hep C w/o coma, chronic   . Anxiety   . Depression   . ED (erectile dysfunction)   . Heroin abuse   . Alcohol abuse    AXIS IV:  other psychosocial or environmental problems, problems related to legal system/crime and problems with primary support group AXIS V:  41-50 serious symptoms  Treatment Plan/Recommendations:   Treatment Plan  Summary: Review of chart, vital signs, medications and notes Daily contact with the patient to assess and evaluate synmptoms and progress in treatment  1. Continue crisis management and stabilization. Estimated length of stay 5-7 days 2.  Medication management to reduce current symptoms to base line and improve patient's overall level of functioning Medications reviewed with the apteint and no untoward effects  Individual and group therapy encouraged Coping skills for depression, substance abuse, and anxiety  3.  Treat health problems as indicated. -stated on Keflex 500 mg po qid 4.  Develop treatment plan to decrease risk of relapse upon discharge and the need for readmission 5.  Psych-social education regarding relapse prevention and self care. 6.  Health care follow up as needed for medical problems 7.  Continue home medications where appropriate 8.  Disposition in progress   Treatment Plan Summary: Daily contact with patient to assess and evaluate symptoms and progress in treatment Medication management  Current Medications:  Current Facility-Administered Medications  Medication Dose Route Frequency Provider Last Rate Last Dose  . acetaminophen (TYLENOL) tablet 650 mg  650 mg Oral Q6H PRN Lurena Nida, NP      . alum & mag hydroxide-simeth (MAALOX/MYLANTA) 200-200-20 MG/5ML suspension 30 mL  30 mL Oral Q4H PRN Lurena Nida, NP      . chlordiazePOXIDE (LIBRIUM) capsule 25 mg  25 mg Oral Q6H PRN Lurena Nida, NP      . cloNIDine (CATAPRES) tablet 0.1 mg  0.1 mg Oral QID Lurena Nida, NP   0.1 mg at 10/05/13 1318   Followed by  . [START ON 10/07/2013] cloNIDine (CATAPRES) tablet 0.1 mg  0.1 mg Oral BH-qamhs Lurena Nida, NP       Followed by  . [START ON 10/09/2013] cloNIDine (CATAPRES) tablet 0.1 mg  0.1 mg Oral QAC breakfast Lurena Nida, NP      . dicyclomine (BENTYL) tablet 20 mg  20 mg Oral Q6H PRN Lurena Nida, NP      . hydrOXYzine (ATARAX/VISTARIL) tablet 25 mg  25 mg  Oral Q6H PRN Lurena Nida, NP      . loperamide (IMODIUM) capsule 2-4 mg  2-4 mg Oral PRN Lurena Nida, NP      . magnesium hydroxide (MILK OF MAGNESIA) suspension 30 mL  30 mL Oral Daily PRN Lurena Nida, NP      . methocarbamol (ROBAXIN) tablet 500 mg  500 mg Oral Q8H PRN Lurena Nida, NP      . multivitamin with minerals tablet 1 tablet  1 tablet Oral Daily Lurena Nida, NP   1 tablet at 10/05/13 1318  . naproxen (NAPROSYN) tablet 500 mg  500 mg Oral BID PRN Lurena Nida, NP      . ondansetron (ZOFRAN-ODT) disintegrating tablet 4 mg  4 mg Oral Q6H PRN Lurena Nida, NP      . pantoprazole (PROTONIX) EC tablet 40 mg  40 mg Oral Daily Lurena Nida, NP   40 mg at 10/05/13 1317  . [START ON 10/06/2013] thiamine (VITAMIN B-1) tablet 100 mg  100 mg Oral Daily Lurena Nida, NP      . traZODone (DESYREL) tablet 150 mg  150 mg Oral QHS Lurena Nida, NP        Observation Level/Precautions:  15 minute checks  Laboratory:  reviewed from ED  Psychotherapy:    Medications:  See [facility]  Consultations:    Discharge Concerns:    Estimated LOS:  Other:     I certify that inpatient services furnished can reasonably be expected to improve the patient's condition.   [place] 4/25/20152:27 PM I have examined the patient and agreed with the findings of H&P and treatment plan.

## 2013-10-05 NOTE — [facility] Group Notes (Signed)
[facility] LCSW Group Therapy Note  10/05/2013/10 AM  Type of Therapy and Topic:  Group Therapy: Avoiding Self-Sabotaging and Enabling Behaviors  Participation Level:  Did Not Attend      Carney Bernatherine C Shion Bluestein, LCSW

## 2013-10-05 NOTE — Progress Notes (Signed)
D   Pt has isolated to his room the entire shift   He reports not feeling like he can get out of bed and requested his snack and medications be brought to him   His blood pressure is running low so the clonidine was held  A   Verbal support given   Medications administered and effectiveness monitored   Encouraged fluids   Educated on same    Q 15 min checks R   Pt safe at present

## 2013-10-05 NOTE — ED Provider Notes (Signed)
Medical screening examination/treatment/procedure(s) were performed by non-physician practitioner and as supervising physician I was immediately available for consultation/collaboration.   EKG Interpretation None        Candyce ChurnJohn David Rhenda Oregon III, MD 10/05/13 [phone]

## 2013-10-06 DIAGNOSIS — F4324 Adjustment disorder with disturbance of conduct: Secondary | ICD-10-CM

## 2013-10-06 MED ORDER — ENSURE COMPLETE PO LIQD
237.0000 mL | Freq: Two times a day (BID) | ORAL | Status: DC
  Administered 2013-10-07: 237 mL via ORAL

## 2013-10-06 NOTE — Progress Notes (Signed)
[facility] Group Notes:  (Nursing/MHT/Case Management/Adjunct)  Date:  10/06/2013  Time:  6:44 PM  Type of Therapy:  Therapeutic Activity  Participation Level:  Did Not Attend  Summary of Progress/Problems: Pt was in bed at the time of group.  Caswell CorwinDana C Juris Gosnell 10/06/2013, 6:44 PM

## 2013-10-06 NOTE — Progress Notes (Signed)
[facility] MD Progress Note  10/06/2013 9:02 AM Bryan Powell  MRN:  [id number] Subjective:  He is lethargic and moves slowly.   He tells me he is "feeling pretty bad" yet he is focused on  leaving  on Wednesday. "Thye can't keep me here can they"   He has a lot of things to do, to maintain probation.  He tells me he attend NA/AA meetings 3X a week, lives here in [place].  Plan is to dc home with wife.   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 Reflux   . Lower extremity venous stasis   . Genital herpes   . GERD (gastroesophageal reflux disease)   . Hep C w/o coma, chronic   . Anxiety   . Depression   . ED (erectile dysfunction)   . Heroin abuse   . Alcohol abuse    Axis IV: economic problems, other psychosocial or environmental problems, problems related to legal system/crime, problems with access to health care services and problems with primary support group Axis V: 41-50 serious symptoms  ADL's:  Intact  Sleep: Fair  Appetite:  Poor  Suicidal Ideation: denies Homicidal Ideation: denies AEB (as evidenced by):  Psychiatric Specialty Exam: Physical Exam  Constitutional: He is oriented to person, place, and time. He appears well-developed and well-nourished.  Neck: Normal range of motion. Neck supple.  Respiratory: Effort normal.  Musculoskeletal: Normal range of motion.  Neurological: He is alert and oriented to person, place, and time.  Skin: Skin is warm and dry.  No noted abscess on buttocks    ROS  Blood pressure 98/66, pulse 52, temperature 97.8 F (36.6 C), temperature source Oral, resp. rate 18, height _0  (1.803 m), weight 78.812 kg (173 lb 12 oz), SpO2 98.00%.Body mass index is 24.24 kg/(m^2).   General Appearance: Fairly Groomed  Engineer, water::  Fair  Speech:  Slow  Volume:  Decreased  Mood:  Depressed  Affect:  Congruent  Thought Process:  Goal Directed  Orientation:  Full (Time, Place, and Person)  Thought Content:  Negative  Suicidal Thoughts:  No  Homicidal Thoughts:  No  Memory:  Immediate;   Fair Recent;   Fair Remote;   Fair  Judgement:  Impaired  Insight:  Lacking  Psychomotor Activity:  Decreased  Concentration:  Fair  Recall:  [place]: Fair  Akathisia:  Negative  Handed:  Right  AIMS (if indicated):     Assets:  Communication Skills Desire for Improvement Financial Resources/Insurance Resilience  Sleep:  Number of Hours: 6   Musculoskeletal: Strength & Muscle Tone: within normal limits Gait & Station: normal Patient leans: N/A  Current Medications: Current Facility-Administered Medications  Medication Dose Route Frequency Provider Last Rate Last Dose  . acetaminophen (TYLENOL) tablet 650 mg  650 mg Oral Q6H PRN Lurena Nida, NP      . alum & mag hydroxide-simeth (MAALOX/MYLANTA) 200-200-20 MG/5ML suspension 30 mL  30 mL Oral Q4H PRN Lurena Nida, NP      . cephALEXin (KEFLEX) capsule 500 mg  500 mg Oral QID Nicholaus Bloom, MD   500 mg at 10/06/13 0841  . chlordiazePOXIDE (LIBRIUM) capsule 25 mg  25 mg Oral Q6H PRN Manus Gunning  Barrett Henle, NP      . cloNIDine (CATAPRES) tablet 0.1 mg  0.1 mg Oral QID Lurena Nida, NP   0.1 mg at 10/05/13 1835   Followed by  . [START ON 10/07/2013] cloNIDine (CATAPRES) tablet 0.1 mg  0.1 mg Oral BH-qamhs Lurena Nida, NP       Followed by  . [START ON 10/09/2013] cloNIDine (CATAPRES) tablet 0.1 mg  0.1 mg Oral QAC breakfast Lurena Nida, NP      . dicyclomine (BENTYL) tablet 20 mg  20 mg Oral Q6H PRN Lurena Nida, NP      . hydrOXYzine (ATARAX/VISTARIL) tablet 25 mg  25 mg Oral Q6H PRN Lurena Nida, NP   25 mg at 10/06/13 0414  . loperamide (IMODIUM) capsule 2-4 mg  2-4 mg Oral PRN Lurena Nida, NP      . magnesium hydroxide (MILK OF MAGNESIA) suspension 30 mL  30 mL Oral Daily PRN Lurena Nida, NP      . methocarbamol (ROBAXIN) tablet 500 mg  500 mg Oral Q8H PRN Lurena Nida, NP      . multivitamin with minerals tablet 1 tablet  1 tablet Oral Daily Lurena Nida, NP   1 tablet at 10/06/13 0841  . naproxen (NAPROSYN) tablet 500 mg  500 mg Oral BID PRN Lurena Nida, NP      . ondansetron (ZOFRAN-ODT) disintegrating tablet 4 mg  4 mg Oral Q6H PRN Lurena Nida, NP      . pantoprazole (PROTONIX) EC tablet 40 mg  40 mg Oral Daily Lurena Nida, NP   40 mg at 10/06/13 0840  . thiamine (VITAMIN B-1) tablet 100 mg  100 mg Oral Daily Lurena Nida, NP   100 mg at 10/06/13 0841  . traZODone (DESYREL) tablet 150 mg  150 mg Oral QHS Lurena Nida, NP   150 mg at 10/05/13 2201    Lab Results:  Results for orders placed during the hospital encounter of 10/04/13 (from the past 48 hour(s))  ACETAMINOPHEN LEVEL     Status: None   Collection Time    10/04/13  6:38 PM      Result Value Ref Range   Acetaminophen (Tylenol), Serum <15.0  10 - 30 ug/mL   Comment:            THERAPEUTIC CONCENTRATIONS VARY     SIGNIFICANTLY. A RANGE OF 10-30     ug/mL MAY BE AN EFFECTIVE     CONCENTRATION FOR MANY PATIENTS.     HOWEVER, SOME ARE BEST TREATED     AT CONCENTRATIONS OUTSIDE THIS     RANGE.     ACETAMINOPHEN CONCENTRATIONS     >150 ug/mL AT 4 HOURS AFTER     INGESTION AND >50 ug/mL AT 12     HOURS AFTER INGESTION ARE     OFTEN ASSOCIATED WITH TOXIC     REACTIONS.  CBC     Status: None   Collection Time    10/04/13  6:38 PM      Result Value Ref Range   WBC 6.8  4.0 - 10.5 K/uL   RBC 5.29  4.22 - 5.81 MIL/uL   Hemoglobin 15.1  13.0 - 17.0 g/dL   HCT 44.7  39.0 - 52.0 %   MCV 84.5  78.0 - 100.0 fL   MCH 28.5  26.0 - 34.0 pg   MCHC 33.8  30.0 - 36.0 g/dL  RDW 14.3  11.5 - 15.5 %   Platelets 190  150 - 400 K/uL  COMPREHENSIVE METABOLIC PANEL     Status: Abnormal   Collection Time     10/04/13  6:38 PM      Result Value Ref Range   Sodium 141  137 - 147 mEq/L   Potassium 3.6 (*) 3.7 - 5.3 mEq/L   Chloride 103  96 - 112 mEq/L   CO2 23  19 - 32 mEq/L   Glucose, Bld 143 (*) 70 - 99 mg/dL   BUN 7  6 - 23 mg/dL   Creatinine, Ser 0.58  0.50 - 1.35 mg/dL   Calcium 9.3  8.4 - 10.5 mg/dL   Total Protein 7.7  6.0 - 8.3 g/dL   Albumin 3.9  3.5 - 5.2 g/dL   AST 24  0 - 37 U/L   ALT 25  0 - 53 U/L   Alkaline Phosphatase 67  39 - 117 U/L   Total Bilirubin 0.5  0.3 - 1.2 mg/dL   GFR calc non Af Amer >90  >90 mL/min   GFR calc Af Amer >90  >90 mL/min   Comment: (NOTE)     The eGFR has been calculated using the CKD EPI equation.     This calculation has not been validated in all clinical situations.     eGFR's persistently <90 mL/min signify possible Chronic Kidney     Disease.  ETHANOL     Status: Abnormal   Collection Time    10/04/13  6:38 PM      Result Value Ref Range   Alcohol, Ethyl (B) 53 (*) 0 - 11 mg/dL   Comment:            LOWEST DETECTABLE LIMIT FOR     SERUM ALCOHOL IS 11 mg/dL     FOR MEDICAL PURPOSES ONLY  SALICYLATE LEVEL     Status: Abnormal   Collection Time    10/04/13  6:38 PM      Result Value Ref Range   Salicylate Lvl <8.8 (*) 2.8 - 20.0 mg/dL  URINE RAPID DRUG SCREEN (HOSP PERFORMED)     Status: Abnormal   Collection Time    10/05/13  1:42 AM      Result Value Ref Range   Opiates POSITIVE (*) NONE DETECTED   Cocaine NONE DETECTED  NONE DETECTED   Benzodiazepines NONE DETECTED  NONE DETECTED   Amphetamines POSITIVE (*) NONE DETECTED   Tetrahydrocannabinol NONE DETECTED  NONE DETECTED   Barbiturates NONE DETECTED  NONE DETECTED   Comment:            DRUG SCREEN FOR MEDICAL PURPOSES     ONLY.  IF CONFIRMATION IS NEEDED     FOR ANY PURPOSE, NOTIFY LAB     WITHIN 5 DAYS.                LOWEST DETECTABLE LIMITS     FOR URINE DRUG SCREEN     Drug Class       Cutoff (ng/mL)     Amphetamine      1000     Barbiturate      200      Benzodiazepine   875     Tricyclics       797     Opiates          300     Cocaine          300  THC              50    Physical Findings: AIMS: Facial and Oral Movements Muscles of Facial Expression: None, normal Lips and Perioral Area: None, normal Jaw: None, normal Tongue: None, normal,Extremity Movements Upper (arms, wrists, hands, fingers): None, normal Lower (legs, knees, ankles, toes): None, normal, Trunk Movements Neck, shoulders, hips: None, normal, Overall Severity Severity of abnormal movements (highest score from questions above): None, normal Incapacitation due to abnormal movements: None, normal Patient's awareness of abnormal movements (rate only patient's report): No Awareness, Dental Status Current problems with teeth and/or dentures?: No Does patient usually wear dentures?: No  CIWA:  CIWA-Ar Total: 3 COWS:  COWS Total Score: 0  Treatment Plan Summary: Daily contact with patient to assess and evaluate symptoms and progress in treatment Medication management -no SE reported  Medical Decision Making Problem Points:  Established problem, stable/improving (1) and Review of psycho-social stressors (1) Data Points:  Review of medication regiment & side effects (2)  -continued conversation regarding personnel choices and taking full benefits of this hospital stay, attending groups and setting up a good OP plan for successful outcomes  I certify that inpatient services furnished can reasonably be expected to improve the patient's condition.   Knox Royalty  PMHNP 10/06/2013, 9:02 AM I agreed with findings and treatment plan of this patient

## 2013-10-06 NOTE — [facility] Group Notes (Signed)
[facility] LCSW Group Therapy Note  10/06/2013 / 10:00 AM   Type of Therapy and Topic: Group Therapy: Feelings Around Returning Home & Establishing a Supportive Framework and Activity to Identify signs of Improvement or Decompensation   Participation Level: Did not attend  Catherine C Harrill, LCSW   

## 2013-10-06 NOTE — Progress Notes (Signed)
Found pt wrapped in blanket sitting in his darkened room where he has been all evening. Reports feeling "awful" but doesn't offer many specifics. With some prompting pt reports nausea, generalized body aches/pain of a 7/10 and cold chills. CIWA is a "5" and COWS is a "6". Medicated with zofran, robaxin and naproxen prn. Also given ginger ale with scheduled meds. BP continues to run low and pt is a fall risk therefore clonidine held. Will reassess at the 1 hour mark. He denies SI/HI/AVH and is safe at this time.

## 2013-10-06 NOTE — Progress Notes (Signed)
D) Pt continues to stay in his room, requesting that his medication be brought to him. Asks the question "When am I going home". Several times. States that he is having a hard detox. Not really eating much or drinking much at this time. States, "I just want to go home".  States that he is here to detox and he wants that but he is also having cravings and that is difficult for him. A) Pt given support, reassurance and praise, along with encouragement. Medications, fluids and food brought to Pt throughout the shift. Clonidine withheld due to a low blood pressure this morning. Provided Pt with a 1:1. Monitoring Q 15 minutes  R) Pt continues to state that he is having a hard detox. Not involved in the groups or going to the cafeteria at this point. Clonidine held due to low blood pressure.

## 2013-10-06 NOTE — Progress Notes (Signed)
Patient was notified of AA group meeting but did not attend. Patient stayed in room throughout group meeting.

## 2013-10-07 DIAGNOSIS — F411 Generalized anxiety disorder: Secondary | ICD-10-CM

## 2013-10-07 DIAGNOSIS — F1120 Opioid dependence, uncomplicated: Principal | ICD-10-CM

## 2013-10-07 NOTE — [facility] Group Notes (Signed)
[facility]   10/07/2013  8:45 AM  Participation Quality:  Did Not Attend - pt sleeping in his room  Bryan IvanChelsea Horton, LCSW 10/07/2013 10:19 AM

## 2013-10-07 NOTE — [facility] Group Notes (Signed)
[facility] LCSW Group Therapy  10/07/2013 1:15 PM   Type of Therapy:  Group Therapy  Participation Level:  Did Not Attend - pt sleeping in his room  Reyes IvanChelsea Horton, LCSW 10/07/2013 3:02 PM

## 2013-10-07 NOTE — Progress Notes (Signed)
Pt reports he is doing better this evening.  He says his withdrawal symptoms are decreasing.  He attended evening AA meeting tonight.  He states he has to detox to be able to go to a program set up by the drug court.  Pt's medications were reviewed with pt, and he states that he does not want to take any more Keflex.  His BP was low tonight, so he will not receive the clonidine tonight.  Pt requested Vistaril in addition to the Trazodone that is scheduled.  Pt makes his needs known to staff.   Pt denies SI/HI/AV.  Support and encouragement offered.  Safety maintained with q15 minute checks.

## 2013-10-07 NOTE — Progress Notes (Signed)
[name], male   DOB: 11/23/1950, 63 y.o.   MRN: [id number]  D: Pt. Denies SI/HI and A/V Hallucinations. Patient did not fill out daily inventory sheet. Patient reported nausea but refused Zofran when Clinical research associatewriter offered.  A: Support and encouragement provided to the patient to talk to writer about any questions or concerns. Scheduled medications per physician's orders.  R: Patient is receptive but minimal. Patient is rarely seen in the milieu and has been in bed sleeping throughout the morning. Q15 minute checks are maintained for safety.

## 2013-10-07 NOTE — Progress Notes (Signed)
[facility]  10/07/2013 5:06 PM Dannielle HuhCameron S Reiger  MRN:  [id number] Subjective:  States he has been trying to get his addiction under control. He is under Drug Court and he is here for detox. They will want him to go to a program. (reserved, guarded) Diagnosis:   DSM5: Schizophrenia Disorders:  none Obsessive-Compulsive Disorders:  none Trauma-Stressor Disorders:  none Substance/Addictive Disorders:  Opioid Disorder - Severe (304.00) Depressive Disorders:  Major Depressive Disorder - Moderate (296.22) Total Time spent with patient: 30 minutes  Axis I: Generalized Anxiety Disorder  ADL's:  Intact  Sleep: Fair  Appetite:  Fair  Suicidal Ideation:  Plan:  denies Intent:  denies Means:  denies Homicidal Ideation:  Plan:  denies Intent:  denies Means:  denies AEB (as evidenced by):  Psychiatric Specialty Exam: Physical Exam  Review of Systems  Constitutional: Positive for malaise/fatigue.  HENT: Negative.   Eyes: Negative.   Respiratory: Negative.   Cardiovascular: Negative.   Gastrointestinal: Negative.   Genitourinary: Negative.   Musculoskeletal: Positive for back pain.  Skin: Negative.   Neurological: Negative.   Endo/Heme/Allergies: Negative.   Psychiatric/Behavioral: Positive for substance abuse. The patient is nervous/anxious.     Blood pressure 94/61, pulse 82, temperature 97.7 F (36.5 C), temperature source Oral, resp. rate 16, height 5\' 11"  (1.803 m), weight 78.812 kg (173 lb 12 oz), SpO2 98.00%.Body mass index is 24.24 kg/(m^2).  General Appearance: Fairly Groomed  Patent attorneyye Contact::  Fair  Speech:  Clear and Coherent, Slow and not spontaneous  Volume:  Decreased  Mood:  Anxious and Depressed  Affect:  Restricted  Thought Process:  Coherent and Goal Directed  Orientation:  Full (Time, Place, and Person)  Thought Content:  symptoms, worries, concerns  Suicidal Thoughts:  No  Homicidal Thoughts:  No  Memory:  Immediate;   Fair Recent;    Fair Remote;   Fair  Judgement:  Fair  Insight:  Present  Psychomotor Activity:  Restlessness  Concentration:  Fair  Recall:  FiservFair  Fund of Knowledge:NA  Language: Fair  Akathisia:  No  Handed:    AIMS (if indicated):     Assets:  Desire for Improvement Housing  Sleep:  Number of Hours: 6.5   Musculoskeletal: Strength & Muscle Tone: within normal limits Gait & Station: normal Patient leans: N/A  Current Medications: Current Facility-Administered Medications  Medication Dose Route Frequency Provider Last Rate Last Dose  . acetaminophen (TYLENOL) tablet 650 mg  650 mg Oral Q6H PRN Kristeen MansFran E Hobson, NP      . alum & mag hydroxide-simeth (MAALOX/MYLANTA) 200-200-20 MG/5ML suspension 30 mL  30 mL Oral Q4H PRN Kristeen MansFran E Hobson, NP      . cephALEXin (KEFLEX) capsule 500 mg  500 mg Oral QID Rachael FeeIrving A Genola Yuille, MD   500 mg at 10/07/13 1205  . chlordiazePOXIDE (LIBRIUM) capsule 25 mg  25 mg Oral Q6H PRN Kristeen MansFran E Hobson, NP   25 mg at 10/06/13 0958  . cloNIDine (CATAPRES) tablet 0.1 mg  0.1 mg Oral BH-qamhs Kristeen MansFran E Hobson, NP   0.1 mg at 10/07/13 0857   Followed by  . [START ON 10/09/2013] cloNIDine (CATAPRES) tablet 0.1 mg  0.1 mg Oral QAC breakfast Kristeen MansFran E Hobson, NP      . dicyclomine (BENTYL) tablet 20 mg  20 mg Oral Q6H PRN Kristeen MansFran E Hobson, NP   20 mg at 10/06/13 0958  . feeding supplement (ENSURE COMPLETE) (ENSURE COMPLETE) liquid 237 mL  237 mL Oral BID  BM Canary BrimMary W Larach, NP   237 mL at 10/07/13 1426  . hydrOXYzine (ATARAX/VISTARIL) tablet 25 mg  25 mg Oral Q6H PRN Kristeen MansFran E Hobson, NP   25 mg at 10/06/13 0414  . loperamide (IMODIUM) capsule 2-4 mg  2-4 mg Oral PRN Kristeen MansFran E Hobson, NP      . magnesium hydroxide (MILK OF MAGNESIA) suspension 30 mL  30 mL Oral Daily PRN Kristeen MansFran E Hobson, NP      . methocarbamol (ROBAXIN) tablet 500 mg  500 mg Oral Q8H PRN Kristeen MansFran E Hobson, NP   500 mg at 10/06/13 2156  . multivitamin with minerals tablet 1 tablet  1 tablet Oral Daily Kristeen MansFran E Hobson, NP   1 tablet at 10/07/13 0856   . naproxen (NAPROSYN) tablet 500 mg  500 mg Oral BID PRN Kristeen MansFran E Hobson, NP   500 mg at 10/06/13 2156  . ondansetron (ZOFRAN-ODT) disintegrating tablet 4 mg  4 mg Oral Q6H PRN Kristeen MansFran E Hobson, NP   4 mg at 10/06/13 2156  . pantoprazole (PROTONIX) EC tablet 40 mg  40 mg Oral Daily Kristeen MansFran E Hobson, NP   40 mg at 10/07/13 0856  . thiamine (VITAMIN B-1) tablet 100 mg  100 mg Oral Daily Kristeen MansFran E Hobson, NP   100 mg at 10/07/13 0857  . traZODone (DESYREL) tablet 150 mg  150 mg Oral QHS Kristeen MansFran E Hobson, NP   150 mg at 10/06/13 2156    Lab Results: No results found for this or any previous visit (from the past 48 hour(s)).  Physical Findings: AIMS: Facial and Oral Movements Muscles of Facial Expression: None, normal Lips and Perioral Area: None, normal Jaw: None, normal Tongue: None, normal,Extremity Movements Upper (arms, wrists, hands, fingers): None, normal Lower (legs, knees, ankles, toes): None, normal, Trunk Movements Neck, shoulders, hips: None, normal, Overall Severity Severity of abnormal movements (highest score from questions above): None, normal Incapacitation due to abnormal movements: None, normal Patient's awareness of abnormal movements (rate only patient's report): No Awareness, Dental Status Current problems with teeth and/or dentures?: No Does patient usually wear dentures?: No  CIWA:  CIWA-Ar Total: 4 COWS:  COWS Total Score: 2  Treatment Plan Summary: Daily contact with patient to assess and evaluate symptoms and progress in treatment Medication management  Plan: Supportive approach/coping skills/relapse prevention           Clonidine Detox           Reassess and address the co morbidities             Medical Decision Making Problem Points:  Review of psycho-social stressors (1) Data Points:  Review of medication regiment & side effects (2)  I certify that inpatient services furnished can reasonably be expected to improve the patient's condition.   Rachael Feerving A  Demon Volante 10/07/2013, 5:06 PM

## 2013-10-07 NOTE — Progress Notes (Signed)
Pt resting in bed with eyes closed.  No distress observed.  Respirations even/unlabored.  Safety maintained with q15 minute checks. 

## 2013-10-07 NOTE — Tx Team (Signed)
Interdisciplinary Treatment Plan Update (Adult)  Date: 10/07/2013  Time Reviewed:  9:45 AM  Progress in Treatment: Attending groups: Yes Participating in groups:  Yes Taking medication as prescribed:  Yes Tolerating medication:  Yes Family/Significant othe contact made: CSW assessing Patient understands diagnosis:  Yes Discussing patient identified problems/goals with staff:  Yes Medical problems stabilized or resolved:  Yes Denies suicidal/homicidal ideation: Yes Issues/concerns per patient self-inventory:  Yes Other:  New problem(s) identified: N/A  Discharge Plan or Barriers: CSW assessing for appropriate referrals.    Reason for Continuation of Hospitalization: Anxiety Depression Medication Stabilization Detox  Comments: N/A  Estimated length of stay: 3-5 days  For review of initial/current patient goals, please see plan of care.  Attendees: Patient:     Family:     Physician:  Dr. Dub MikesLugo 10/07/2013 10:17 AM   Nursing:   Roswell Minersonna Shimp, RN 10/07/2013 10:17 AM   Clinical Social Worker:  Reyes Ivanhelsea Horton, LCSW 10/07/2013 10:17 AM   Other: Onnie BoerJennifer Clark, RN case manager 10/07/2013 10:17 AM   Other:  Trula SladeHeather Smart, LCSWA 10/07/2013 10:17 AM   Other:  Serena ColonelAggie Nwoko, NP 10/07/2013 10:17 AM   Other:  Marzetta Boardhrista Dopson, RN 10/07/2013 10:18 AM   Other:    Other:    Other:    Other:    Other:    Other:     Scribe for Treatment Team:   Carmina MillerHorton, Chloris Marcoux Nicole, 10/07/2013 , 10:17 AM

## 2013-10-07 NOTE — Progress Notes (Signed)
Adult Psychoeducational Group Note  Date:  10/07/2013 Time:  12:57 PM  Group Topic/Focus:  Self Care:   The focus of this group is to help patients understand the importance of self-care in order to improve or restore emotional, physical, spiritual, interpersonal, and financial health.  Participation Level:  Did Not Attend  Additional Comments:  Pt was in bed at the time of group.  Bryan Powell C Bryan Powell 10/07/2013, 12:57 PM 

## 2013-10-08 DIAGNOSIS — F1994 Other psychoactive substance use, unspecified with psychoactive substance-induced mood disorder: Secondary | ICD-10-CM

## 2013-10-08 MED ORDER — RISAQUAD PO CAPS
2.0000 | ORAL_CAPSULE | Freq: Two times a day (BID) | ORAL | Status: DC
  Administered 2013-10-08 – 2013-10-09 (×2): 2 via ORAL
  Filled 2013-10-08 (×7): qty 2

## 2013-10-08 MED ORDER — ESCITALOPRAM OXALATE 10 MG PO TABS
10.0000 mg | ORAL_TABLET | Freq: Every day | ORAL | Status: DC
  Administered 2013-10-08 – 2013-10-09 (×2): 10 mg via ORAL
  Filled 2013-10-08: qty 1
  Filled 2013-10-08: qty 4
  Filled 2013-10-08 (×3): qty 1

## 2013-10-08 NOTE — [facility] Group Notes (Signed)
[facility] LCSW Group Therapy  10/08/2013 1:15 PM   Type of Therapy:  Group Therapy  Participation Level:  Did Not Attend - pt sleeping in his room  Lashawn Orrego Horton, LCSW 10/08/2013 2:39 PM  

## 2013-10-08 NOTE — Progress Notes (Signed)
The focus of this group is to educate the patient on the purpose and policies of crisis stabilization and provide a format to answer questions about their admission.  The group details unit policies and expectations of patients while admitted. Patient did not attend this group. 

## 2013-10-08 NOTE — [facility] Suicide Risk Assessment (Signed)
[facility] INPATIENT: Family/Significant Other Suicide Prevention Education   Suicide Prevention Education:  Education Completed; No one has been identified by the patient as the family member/significant other with whom the patient will be residing, and identified as the person(s) who will aid the patient in the event of a mental health crisis (suicidal ideations/suicide attempt).   Pt did not c/o SI at admission, nor have they endorsed SI during their stay here. SPE not required. SPI pamphlet provided to pt and he was encouraged to share information with his support network, ask questions, and talk about any concerns.   The suicide prevention education provided includes the following:  Suicide risk factors  Suicide prevention and interventions  National Suicide Hotline telephone number  [facility] assessment telephone number  [facility] Emergency Assistance 911  [facility] and/or [facility]idential Mobile Crisis Unit telephone number  Reyes IvanChelsea Horton, [place]CS[place] 10/08/2013  2:38 PM

## 2013-10-08 NOTE — Progress Notes (Signed)
Recreation Therapy Notes  Animal-Assisted Activity/Therapy (AAA/T) Program Checklist/Progress Notes Patient Eligibility Criteria Checklist & Daily Group note for Rec Tx Intervention  Date: 04.28.2015 Time: 2:45pm Location: [address] Dayroom    AAA/T Program Assumption of Risk Form signed by Patient/ or Parent Legal Guardian yes  Patient is free of allergies or sever asthma yes  Patient reports no fear of animals yes  Patient reports no history of cruelty to animals yes   Patient understands his/her participation is voluntary yes  Behavioral Response: DID NOT ATTEND   Zuha Dejonge L Jamina Macbeth, LRT/CTRS  Arthor Gorter L Laiah Pouncey 10/08/2013 4:39 PM 

## 2013-10-08 NOTE — Progress Notes (Signed)
Adult Psychoeducational Group Note  Date:  10/08/2013 Time:  11:57 PM  Group Topic/Focus:  Self Care:   The focus of this group is to help patients understand the importance of self-care in order to improve or restore emotional, physical, spiritual, interpersonal, and financial health.  Participation Level:  Did Not Attend  Participation Quality:  did not attend  Affect:  did not attend  Cognitive:  did not attend  Insight: None  Engagement in Group:  did not attend  Modes of Intervention:  Support  Additional Comments:  NA/AA guest speaker. Pt did not attend.  Corrinna Karapetyan 10/08/2013, 11:57 PM

## 2013-10-08 NOTE — [facility] Counselor (Signed)
Adult Comprehensive Assessment  [name], male   DOB: 02/19/1951, 63 y.o.   MRN: [id number]  Information Source: Information source: Patient  Current Stressors:  Employment / Job issues: Retired Surveyor, quantityinancial / Lack of resources (include bankruptcy): finances are tight Physical health (include injuries & life threatening diseases): depressed about being his age and dealing with this Substance abuse: Heroin - pt is currently in drug court and stressed about this  Living/Environment/Situation:  Living Arrangements: Spouse/significant other Living conditions (as described by patient or guardian): [place]  Pt reports that this is a good environment.  How long has patient lived in current situation?: All his life What is atmosphere in current home: Supportive;Loving;Comfortable  Family History:  Marital status: Married Number of Years Married: 30 What types of issues is patient dealing with in the relationship?: Pt reports having a good marriage and she is supportive Additional relationship information: N/A Does patient have children?: Yes How many children?: 1 How is patient's relationship with their children?: Pt reports having a good relationship with 64 yr old daughter  Childhood History:  By whom was/is the patient raised?: Both parents Additional childhood history information: Pt reports having a good childhood.  Description of patient's relationship with caregiver when they were a child: Pt reports getting along well with parents growing up.  Patient's description of current relationship with people who raised him/her: Both parents are deceased.   Does patient have siblings?: Yes Number of Siblings: 2 Description of patient's current relationship with siblings: Pt reports being close to brother and sister Did patient suffer any verbal/emotional/physical/sexual abuse as a child?: No Did patient suffer from severe childhood neglect?:  No Has patient ever been sexually abused/assaulted/raped as an adolescent or adult?: No Was the patient ever a victim of a crime or a disaster?: No Witnessed domestic violence?: No Has patient been effected by domestic violence as an adult?: No  Education:  Highest grade of school patient has completed: some college Currently a student?: No Name of school: N/A Learning disability?: No  Employment/Work Situation:   Employment situation: Unemployed (retired) Patient's job has been impacted by current illness: No What is the longest time patient has a held a job?: 8 years 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 Has patient ever been in the [employer] and Companymilitary?: No Has patient ever served in Buyer, retailcombat?: No  Financial Resources:   Psychologist, prison and probation servicesinancial resources: Private insurance (retirement check) Does patient have a Lawyerrepresentative payee or guardian?: No  Alcohol/Substance Abuse:   What has been your use of drugs/alcohol within the last 12 months?: Heroin - $60 worth daily for the last 6 months If attempted suicide, did drugs/alcohol play a role in this?: No Alcohol/Substance Abuse Treatment Hx: Relapse prevention program If yes, describe treatment: Currently in drug court, [facility] - 6-7 years ago Has alcohol/substance abuse ever caused legal problems?: Yes (Biomedical scientistlarcency charge)  Social Support System:   Patient's Community Support System: Good Describe Community Support System: Pt reports family is supportive Type of faith/religion: None reported How does patient's faith help to cope with current illness?: N/A  Leisure/Recreation:   Leisure and Hobbies: sports  Strengths/Needs:   What things does the patient do well?: sports In what areas does patient struggle / problems for patient: Substance Abuse  Discharge Plan:   Does patient have access to transportation?: Yes Will patient be returning to same living situation after discharge?: Yes Currently  receiving community mental health  services: No If no, would patient like referral for services when discharged?: Yes (What [place]?) ([place] [facility] Does patient have financial barriers related to discharge medications?: No  Summary/Recommendations:     Patient is a 63 year old Caucasian Male with a diagnosis of Opioid Use Disorder, Severe and Alcohol Use Disorder, Severe.  [place].  Pt states that he came to the hospital to detox as ordered by drug court.  Pt doesn't appear motivated for treatment, asking questions like "when can I leave?" and "will drug court get this information?" Pt refused any follow up.  Patient will benefit from crisis stabilization, medication evaluation, group therapy and psycho education in addition to case management for discharge planning.    Capone Schwinn N Horton. 10/08/2013

## 2013-10-08 NOTE — Progress Notes (Signed)
Pt attend AA group this evening.  

## 2013-10-08 NOTE — Progress Notes (Signed)
Patient complaining of diarrhea and that it is yellow liquid; this writer has observed the liquid stool and it was yellow and odorless; patient is denying any pain and no blood is present in the stool; NP Nwoko notified at 1620 and instructed to give imodium as ordered; NP Withrow also made aware and their are no other instructions given at this time.

## 2013-10-08 NOTE — Progress Notes (Signed)
D: Patient denies SI/HI and A/V hallucinations; patient having complaints of anxiety and depression  A: Monitored q 15 minutes; patient encouraged to attend groups; patient educated about medications; patient given medications per physician orders; patient encouraged to express feelings and/or concerns  R: Patient appears to have some relief at this time after the medication but patient wants something for depression; patient is flat and sad; patient is cooperative and pleasant and his interaction with staff and peers is minimal; patient was able to set goal to talk with staff 1:1 when having feelings of SI; patient is taking medications as prescribed and tolerating medications

## 2013-10-08 NOTE — Progress Notes (Signed)
Pt reports that the diarrhea has been decreasing since shift change at 7pm.  He says he is just feeling weak right now.  He has been in his room in bed all evening.  He denies SI/HI/AV.  Other than diarrhea, pt is not c/o of any other withdrawal symptoms.  Pt will go to an outpt SA program after his detox.  Pt makes his needs known to staff.  Support and encouragement offered.  Safety maintained with q15 minute checks.

## 2013-10-08 NOTE — Progress Notes (Signed)
[facility]  10/08/2013 6:17 PM Dannielle HuhCameron S Cowie  MRN:  [id number] Subjective:  Has continued to detox. Still complaining of diarrhea. He is not sure what is going to happen once he gest out of here. States he is committed to abstaining. . Admits to an underlying depression that has never been addressed. Will like to start addressing it. Sates he sees it apart from his substance use. Wonders if the depression triggers his use Diagnosis:   DSM5: Schizophrenia Disorders:  none Obsessive-Compulsive Disorders:  none Trauma-Stressor Disorders:  none Substance/Addictive Disorders:  Opioid Disorder - Severe (304.00) Depressive Disorders:  Major Depressive Disorder - Moderate (296.22) Total Time spent with patient: 30 minutes  Axis I: Generalized Anxiety Disorder and Substance Induced Mood Disorder  ADL's:  Intact  Sleep: Fair  Appetite:  Fair  Suicidal Ideation:  Plan:  denies Intent:  denies Means:  denies Homicidal Ideation:  Plan:  denies Intent:  denies Means:  denies AEB (as evidenced by):  Psychiatric Specialty Exam: Physical Exam  Review of Systems  Constitutional: Positive for malaise/fatigue.  HENT: Negative.   Eyes: Negative.   Respiratory: Negative.   Cardiovascular: Negative.   Gastrointestinal: Positive for diarrhea.  Genitourinary: Negative.   Musculoskeletal: Negative.   Skin: Negative.   Neurological: Negative.   Endo/Heme/Allergies: Negative.   Psychiatric/Behavioral: Positive for substance abuse.    Blood pressure 115/71, pulse 67, temperature 97.8 F (36.6 C), temperature source Oral, resp. rate 16, height 5\' 11"  (1.803 m), weight 78.812 kg (173 lb 12 oz), SpO2 98.00%.Body mass index is 24.24 kg/(m^2).  General Appearance: Fairly Groomed  Patent attorneyye Contact::  Fair  Speech:  Clear and Coherent and Slow  Volume:  Decreased  Mood:  Anxious and Depressed  Affect:  anxious, worried  Thought Process:  Coherent and Goal Directed  Orientation:  Full  (Time, Place, and Person)  Thought Content:  symtpoms, worries, concerns  Suicidal Thoughts:  No  Homicidal Thoughts:  No  Memory:  Immediate;   Fair Recent;   Fair Remote;   Fair  Judgement:  Fair  Insight:  Present and Shallow  Psychomotor Activity:  Restlessness  Concentration:  Fair  Recall:  [employer]  Fund of Knowledge:NA  Language: Fair  Akathisia:  No  Handed:    AIMS (if indicated):     Assets:  Desire for Improvement Housing  Sleep:  Number of Hours: 6.75   Musculoskeletal: Strength & Muscle Tone: within normal limits Gait & Station: normal Patient leans: N/A  Current Medications: Current Facility-Administered Medications  Medication Dose Route Frequency Provider Last Rate Last Dose  . acetaminophen (TYLENOL) tablet 650 mg  650 mg Oral Q6H PRN Kristeen MansFran E Hobson, NP      . alum & mag hydroxide-simeth (MAALOX/MYLANTA) 200-200-20 MG/5ML suspension 30 mL  30 mL Oral Q4H PRN Kristeen MansFran E Hobson, NP   30 mL at 10/08/13 1809  . cephALEXin (KEFLEX) capsule 500 mg  500 mg Oral QID Rachael FeeIrving A Michalina Calbert, MD   500 mg at 10/08/13 [id number]  . cloNIDine (CATAPRES) tablet 0.1 mg  0.1 mg Oral BH-qamhs Kristeen MansFran E Hobson, NP   0.1 mg at 10/08/13 [id number]   Followed by  . [START ON 10/09/2013] cloNIDine (CATAPRES) tablet 0.1 mg  0.1 mg Oral QAC breakfast Kristeen MansFran E Hobson, NP      . dicyclomine (BENTYL) tablet 20 mg  20 mg Oral Q6H PRN Kristeen MansFran E Hobson, NP   20 mg at 10/06/13 0958  . escitalopram (LEXAPRO) tablet 10 mg  10 mg Oral Daily Rachael FeeIrving A Quinterious Walraven, MD   10 mg at 10/08/13 1255  . feeding supplement (ENSURE COMPLETE) (ENSURE COMPLETE) liquid 237 mL  237 mL Oral BID BM Canary BrimMary W Larach, NP   237 mL at 10/07/13 1426  . hydrOXYzine (ATARAX/VISTARIL) tablet 25 mg  25 mg Oral Q6H PRN Kristeen MansFran E Hobson, NP   25 mg at 10/08/13 [id number]  . loperamide (IMODIUM) capsule 2-4 mg  2-4 mg Oral PRN Kristeen MansFran E Hobson, NP   2 mg at 10/08/13 1809  . magnesium hydroxide (MILK OF MAGNESIA) suspension 30 mL  30 mL Oral Daily PRN Kristeen MansFran E Hobson, NP      .  methocarbamol (ROBAXIN) tablet 500 mg  500 mg Oral Q8H PRN Kristeen MansFran E Hobson, NP   500 mg at 10/08/13 0620  . multivitamin with minerals tablet 1 tablet  1 tablet Oral Daily Kristeen MansFran E Hobson, NP   1 tablet at 10/08/13 [id number]  . naproxen (NAPROSYN) tablet 500 mg  500 mg Oral BID PRN Kristeen MansFran E Hobson, NP   500 mg at 10/06/13 2156  . ondansetron (ZOFRAN-ODT) disintegrating tablet 4 mg  4 mg Oral Q6H PRN Kristeen MansFran E Hobson, NP   4 mg at 10/06/13 2156  . pantoprazole (PROTONIX) EC tablet 40 mg  40 mg Oral Daily Kristeen MansFran E Hobson, NP   40 mg at 10/08/13 [id number]  . thiamine (VITAMIN B-1) tablet 100 mg  100 mg Oral Daily Kristeen MansFran E Hobson, NP   100 mg at 10/08/13 0819  . traZODone (DESYREL) tablet 150 mg  150 mg Oral QHS Kristeen MansFran E Hobson, NP   150 mg at 10/07/13 2200    Lab Results: No results found for this or any previous visit (from the past 48 hour(s)).  Physical Findings: AIMS: Facial and Oral Movements Muscles of Facial Expression: None, normal Lips and Perioral Area: None, normal Jaw: None, normal Tongue: None, normal,Extremity Movements Upper (arms, wrists, hands, fingers): None, normal Lower (legs, knees, ankles, toes): None, normal, Trunk Movements Neck, shoulders, hips: None, normal, Overall Severity Severity of abnormal movements (highest score from questions above): None, normal Incapacitation due to abnormal movements: None, normal Patient's awareness of abnormal movements (rate only patient's report): No Awareness, Dental Status Current problems with teeth and/or dentures?: No Does patient usually wear dentures?: No  CIWA:  CIWA-Ar Total: 1 COWS:  COWS Total Score: 2  Treatment Plan Summary: Daily contact with patient to assess and evaluate symptoms and progress in treatment Medication management  Plan: Supportive approach/coping skills/relapse prevention           Pursue detox           Start Lexapro 10 mg daily  Medical Decision Making Problem Points:  New problem, with no additional work-up planned  (3) and Review of psycho-social stressors (1) Data Points:  Review of medication regiment & side effects (2) Review of new medications or change in dosage (2)  I certify that inpatient services furnished can reasonably be expected to improve the patient's condition.   Rachael Feerving A Inika Bellanger 10/08/2013, 6:17 PM

## 2013-10-09 MED ORDER — ADULT MULTIVITAMIN W/MINERALS CH: 1.0000 | ORAL_TABLET | Freq: Every day | ORAL | Status: DC

## 2013-10-09 MED ORDER — ESOMEPRAZOLE MAGNESIUM 20 MG PO CPDR: 20.0000 mg | DELAYED_RELEASE_CAPSULE | Freq: Every day | ORAL | Status: DC

## 2013-10-09 MED ORDER — TRAZODONE HCL 150 MG PO TABS: 150.0000 mg | ORAL_TABLET | Freq: Every day | ORAL | Status: DC

## 2013-10-09 MED ORDER — HYDROXYZINE HCL 25 MG PO TABS
25.0000 mg | ORAL_TABLET | Freq: Three times a day (TID) | ORAL | Status: DC | PRN
  Filled 2013-10-09: qty 12

## 2013-10-09 MED ORDER — ESCITALOPRAM OXALATE 10 MG PO TABS: 10.0000 mg | ORAL_TABLET | Freq: Every day | ORAL | Status: DC

## 2013-10-09 MED ORDER — CEPHALEXIN 500 MG PO CAPS: 500.0000 mg | ORAL_CAPSULE | Freq: Four times a day (QID) | ORAL | Status: DC

## 2013-10-09 MED ORDER — HYDROXYZINE HCL 25 MG PO TABS: ORAL_TABLET | ORAL | Status: DC

## 2013-10-09 NOTE — [facility] Group Notes (Signed)
[facility] LCSW Aftercare Discharge Planning Group Note   10/09/2013  8:45 AM  Participation Quality:  Did Not Attend - pt sleeping in his room  Kally Cadden Horton, LCSW 10/09/2013 9:53 AM       

## 2013-10-09 NOTE — [facility] Suicide Risk Assessment (Signed)
Suicide Risk Assessment  Discharge Assessment     Demographic Factors:  Male and Caucasian  Total Time spent with patient: 45 minutes  Psychiatric Specialty Exam:     Blood pressure 103/69, pulse 70, temperature 97.8 F (36.6 C), temperature source Oral, resp. rate 18, height 5\' 11"  (1.803 m), weight 78.812 kg (173 lb 12 oz), SpO2 98.00%.Body mass index is 24.24 kg/(m^2).  General Appearance: Fairly Groomed  Patent attorneyye Contact::  Fair  Speech:  Clear and Coherent  Volume:  Normal  Mood:  Euthymic  Affect:  Restricted  Thought Process:  Coherent and Goal Directed  Orientation:  Full (Time, Place, and Person)  Thought Content:  plans as he moves on, relapse prevention plan  Suicidal Thoughts:  No  Homicidal Thoughts:  No  Memory:  Immediate;   Fair Recent;   Fair Remote;   Fair  Judgement:  Fair  Insight:  Present  Psychomotor Activity:  Normal  Concentration:  Fair  Recall:  [employer]Fair  Fund of Knowledge:NA  Language: Fair  Akathisia:  No  Handed:  Right  AIMS (if indicated):     Assets:  Desire for Improvement Financial Resources/Insurance Housing Social Support Transportation  Sleep:  Number of Hours: 6.5    Musculoskeletal: Strength & Muscle Tone: within normal limits Gait & Station: normal Patient leans: N/A   Mental Status Per Nursing Assessment::   On Admission:  Self-harm behaviors  Current Mental Status by Physician: In full contact with reality. There are no active S/S of withdrawal. No active SI plans or intent.    Loss Factors: Decline in physical health  Historical Factors: NA  Risk Reduction Factors:   Sense of responsibility to family, Living with another person, especially a relative and Positive social support  Continued Clinical Symptoms:  Depression:   Comorbid alcohol abuse/dependence Alcohol/Substance Abuse/Dependencies  Cognitive Features That Contribute To Risk:  Closed-mindedness Polarized thinking Thought constriction (tunnel vision)     Suicide Risk:  Minimal: No identifiable suicidal ideation.  Patients presenting with no risk factors but with morbid ruminations; may be classified as minimal risk based on the severity of the depressive symptoms  Discharge Diagnoses:   AXIS I:  Opioid Dependence, GAD, Major Depressive Disorder AXIS II:  No diagnosis AXIS III:   Past Medical History  Diagnosis Date  . Reflux   . Lower extremity venous stasis   . Genital herpes   . GERD (gastroesophageal reflux disease)   . Hep C w/o coma, chronic   . Anxiety   . Depression   . ED (erectile dysfunction)   . Heroin abuse   . Alcohol abuse    AXIS IV:  other psychosocial or environmental problems AXIS V:  61-70 mild symptoms  Plan Of Care/Follow-up recommendations:  Activity:  as tolerated Diet:  regular Follow up Drug Court/AA/NA Is patient on multiple antipsychotic therapies at discharge:  No   Has Patient had three or more failed trials of antipsychotic monotherapy by history:  No  Recommended Plan for Multiple Antipsychotic Therapies: NA    Bryan Powell 10/09/2013, 11:49 AM

## 2013-10-09 NOTE — Tx Team (Signed)
Interdisciplinary Treatment Plan Update (Adult)  Date: 10/09/2013  Time Reviewed:  9:45 AM  Progress in Treatment: Attending groups: Yes Participating in groups:  Yes Taking medication as prescribed:  Yes Tolerating medication:  Yes Family/Significant othe contact made: No, N/A Patient understands diagnosis:  Yes Discussing patient identified problems/goals with staff:  Yes Medical problems stabilized or resolved:  Yes Denies suicidal/homicidal ideation: Yes Issues/concerns per patient self-inventory:  Yes Other:  New problem(s) identified: N/A  Discharge Plan or Barriers: Pt refused follow up and states that he will arrange his own.    Reason for Continuation of Hospitalization: Stable to d/c today  Comments: N/A  Estimated length of stay: D/C today  For review of initial/current patient goals, please see plan of care.  Attendees: Patient:     Family:     Physician:  Dr. Dub MikesLugo 10/09/2013 10:29 AM   Nursing:   Marzetta Boardhrista Dopson, RN 10/09/2013 10:29 AM   Clinical Social Worker:  Reyes Ivanhelsea Horton, LCSW 10/09/2013 10:29 AM   Other: Onnie BoerJennifer Clark, RN case manager 10/09/2013 10:29 AM   Other:  Trula SladeHeather Smart, LCSWA 10/09/2013 10:29 AM   Other:  [place] Minersonna Shimp, RN 10/09/2013 10:29 AM   Other:  Leighton ParodyBritney Tyson, RN 10/09/2013 10:29 AM   Other:    Other:    Other:    Other:    Other:    Other:     Scribe for Treatment Team:   Carmina MillerHorton, Lyndle Pang Nicole, 10/09/2013 , 10:29 AM

## 2013-10-09 NOTE — Progress Notes (Signed)
Patient reported that he had a loose stool and yellow in color and NP Nwoko made aware and instructed to give Imodium and patient to follow up with primary when leaving; patient is reporting no pain at this time and no blood observable; no other instructions given at this

## 2013-10-09 NOTE — Discharge Summary (Signed)
Physician Discharge Summary Note  Patient:  Bryan Powell is an 63 y.o., male MRN:  [id number] DOB:  08/04/1950 Patient phone:  [phone] (home)  Patient address:   [address] [place] [address],  Total Time spent with patient: Greater than 30 minutes  Date of Admission:  10/05/2013 Date of Discharge: 10/09/13  Reason for Admission:  Opioid/Alcohol detox  Discharge Diagnoses: Active Problems:   Severe opioid use disorder   Psychiatric Specialty Exam: Physical Exam  Neck: Normal range of motion.  Psychiatric: His speech is normal and behavior is normal. Judgment and thought content normal. His mood appears not anxious. His affect is not angry, not blunt, not labile and not inappropriate. Cognition and memory are normal. He does not exhibit a depressed mood.    Review of Systems  Constitutional: Negative.   HENT: Negative.   Eyes: Negative.   Respiratory: Negative.   Cardiovascular: Negative.   Gastrointestinal: Negative.   Genitourinary: Negative.   Musculoskeletal: Negative.   Skin: Negative.   Neurological: Negative.   Endo/Heme/Allergies: Negative.   Psychiatric/Behavioral: Positive for depression (Stabil;ized with medication prior to discharge) and substance abuse (Opioid dependence). Negative for suicidal ideas, hallucinations and memory loss. The patient has insomnia (Stabilized with medication prior to discharge). The patient is not nervous/anxious.     Blood pressure 103/69, pulse 70, temperature 97.8 F (36.6 C), temperature source Oral, resp. rate 18, height 5\' 11"  (1.803 m), weight 78.812 kg (173 lb 12 oz), SpO2 98.00%.Body mass index is 24.24 kg/(m^2).   General Appearance: Fairly Groomed   Patent attorneyye Contact:: Fair   Speech: Clear and Coherent   Volume: Normal   Mood: Euthymic   Affect: Restricted   Thought Process: Coherent and Goal Directed   Orientation: Full (Time, Place, and Person)   Thought Content: plans as he moves on, relapse prevention  plan   Suicidal Thoughts: No   Homicidal Thoughts: No   Memory: Immediate; Fair  Recent; Fair  Remote; Fair   Judgement: Fair   Insight: Present   Psychomotor Activity: Normal   Concentration: Fair   Recall: [employer]Fair   Fund of Knowledge:NA   Language: Fair   Akathisia: No   Handed: Right   AIMS (if indicated):   Assets: Desire for Improvement  Financial Resources/Insurance  Housing  Social Support  Transportation   Sleep: Number of Hours: 6.5    Past Psychiatric History: Diagnosis: Opioid dependence  Hospitalizations: [facility] adult unit  Outpatient Care: Declined follow-up care  Substance Abuse Care: Declines referral  Self-Mutilation: NA  Suicidal Attempts: NA  Violent Behaviors: NA   Musculoskeletal: Strength & Muscle Tone: within normal limits Gait & Station: normal Patient leans: N/A  DSM5: Schizophrenia Disorders:  NA Obsessive-Compulsive Disorders:  NA Trauma-Stressor Disorders:  NA Substance/Addictive Disorders:  Opioid Disorder - Severe (304.00) Depressive Disorders:  Substance induced mood disorder  Axis Diagnosis:  AXIS I:  Opioid dependence AXIS II:  Deferred AXIS III:   Past Medical History  Diagnosis Date  . Reflux   . Lower extremity venous stasis   . Genital herpes   . GERD (gastroesophageal reflux disease)   . Hep C w/o coma, chronic   . Anxiety   . Depression   . ED (erectile dysfunction)   . Heroin abuse   . Alcohol abuse    AXIS IV:  other psychosocial or environmental problems and Opioid dependence AXIS V:  62  Level of Care:  Refused follow-up care appointment/referral  Hospital Course:  He states he  is part of a drug court program and saw his court counselor 10/04/13. Pt is to enter an outpatient substance abuse program but first needs detox. He reports using 1/2 gram of heroin intravenously daily for the past four months. He has also been drinking at least two pints of "cheap wine" daily for four weeks. Pt states he has had substance  abuse problems periodically throughout his life.  Bryan Powell was admitted to the hospital with his toxicology reports showing blood alcohol level of 53 and UDS positive for Amphetamines and opiate. He was intoxicated needing detoxification treatment. He was ordered and received both Librium and Clonidine detox protocols. He was also enrolled in the group counseling sessions and AA/NA meetings being offered on this unit. He learned coping skills. He also was ordered and received Lexapro 10 mg daily for depression, Hydroxyzine 25 mg qid prn for anxiety and Trazodone 150 mg Q bedtime for sleep. He was medicated and monitored for the other medical ailments that he presented. He tolerated his treatment regimen without any significant adverse effects and or reactions.  Bryan Powell has completed detox treatment and mood stabilized. He is current being discharged to the home that he shares with his wife. He declines any post-hospitalization follow-up appointment and or substance abuse treatment referral made for him. He states that he will make his own appointment. Upon discharge, he adamantly denies any SIHI, AVH, delusional thoughts, paranoia and or withdrawal symptoms. He was provided with a 4 days worth supply samples of [facility] including the remaining doses of his antibiotic therapy. [facility] in no distress. Transportation per wife.   Consults:  psychiatry  Significant Diagnostic Studies:  labs: CBC with diff, CMP, UDS, toxicology tests, U/A  Discharge Vitals:   Blood pressure 103/69, pulse 70, temperature 97.8 F (36.6 C), temperature source Oral, resp. rate 18, height 5\' 11"  (1.803 m), weight 78.812 kg (173 lb 12 oz), SpO2 98.00%. Body mass index is 24.24 kg/(m^2). Lab Results:   No results found for this or any previous visit (from the past 72 hour(s)).  Physical Findings: AIMS: Facial and Oral Movements Muscles of Facial Expression: None,  normal Lips and Perioral Area: None, normal Jaw: None, normal Tongue: None, normal,Extremity Movements Upper (arms, wrists, hands, fingers): None, normal Lower (legs, knees, ankles, toes): None, normal, Trunk Movements Neck, shoulders, hips: None, normal, Overall Severity Severity of abnormal movements (highest score from questions above): None, normal Incapacitation due to abnormal movements: None, normal Patient's awareness of abnormal movements (rate only patient's report): No Awareness, Dental Status Current problems with teeth and/or dentures?: No Does patient usually wear dentures?: No  CIWA:  CIWA-Ar Total: 0 COWS:  COWS Total Score: 2  Psychiatric Specialty Exam: See Psychiatric Specialty Exam and Suicide Risk Assessment completed by Attending Physician prior to discharge.  Discharge destination:  Home  Is patient on multiple antipsychotic therapies at discharge:  No   Has Patient had three or more failed trials of antipsychotic monotherapy by history:  No  Recommended Plan for Multiple Antipsychotic Therapies: NA    Medication List    STOP taking these medications       sulfamethoxazole-trimethoprim 800-160 MG per tablet  Commonly known as:  BACTRIM DS      TAKE these medications     Indication   cephALEXin 500 MG capsule  Commonly known as:  KEFLEX  Take 1 capsule (500 mg total) by mouth 4 (four) times daily. For infection   Indication:  Infection     escitalopram 10 MG tablet  Commonly known as:  LEXAPRO  Take 1 tablet (10 mg total) by mouth daily. For depression   Indication:  Depression, Generalized Anxiety Disorder     esomeprazole 20 MG capsule  Commonly known as:  NEXIUM  Take 1 capsule (20 mg total) by mouth daily at 12 noon. For acid reflux   Indication:  Gastroesophageal Reflux Disease with Current Symptoms     hydrOXYzine 25 MG tablet  Commonly known as:  ATARAX/VISTARIL  Take 1 tablet (25 mg) three times daily as needed: For anxiety    Indication:  Tension, Anxiety     multivitamin with minerals Tabs tablet  Take 1 tablet by mouth daily. For low vitamin   Indication:  Low vitamin     traZODone 150 MG tablet  Commonly known as:  DESYREL  Take 1 tablet (150 mg total) by mouth at bedtime. For sleep   Indication:  Trouble Sleeping       Follow-up Information   Follow up with Patient refused follow up and states that he will schedule his own..     Follow-up recommendations:  Activity:  As tolerated Diet: As recommended by your primary care doctor. Keep all scheduled follow-up appointments as recommended.  Comments: Take all your medications as prescribed by your mental healthcare provider. Report any adverse effects and or reactions from your medicines to your outpatient provider promptly. Patient is instructed and cautioned to not engage in alcohol and or illegal drug use while on prescription medicines. In the event of worsening symptoms, patient is instructed to call the crisis hotline, 911 and or go to the nearest ED for appropriate evaluation and treatment of symptoms. Follow-up with your primary care provider for your other medical issues, concerns and or health care needs.  Total Discharge Time:  Greater than 30 minutes.  Signed: Sanjuana Kava, PMHNP-BC 10/09/2013, 3:09 PM Personally evaluated the patient and agree with assessment and plan Madie Reno A. [place] ,[place].D.

## 2013-10-09 NOTE — Progress Notes (Signed)
Patient discharged per physician order; patient denies SI/HI and A/V hallucinations; patient received samples, prescriptions, and copy of AVS after it was reviewed; patient had no questions or concerns at this time; patient reported no other stool occurrence before discharged; patient verbalized and signed that he received all belongings; patient left the unit alert, oriented, and ambulatory

## 2013-10-09 NOTE — Progress Notes (Signed)
[facility] Discharge Plan :  Will you be returning to the same living situation after discharge: Yes,  returning home At discharge, do you have transportation home?:Yes,  wife will pick pt up Do you have the ability to pay for your medications:Yes,  provided pt with prescriptions and pt verbalizes ability to afford meds.   Release of information consent forms completed and in the chart;  Patient's signature needed at discharge.  Patient to Follow up at: Follow-up Information   Follow up with Patient refused follow up and states that he will schedule his own..      Patient denies SI/HI:   Yes,  denies SI/HI    Safety Planning and Suicide Prevention discussed:  Yes,  discussed with pt.  N/A to contact family/friend due to no SI on admission.    Tessi Eustache N Horton 10/09/2013, 10:30 AM

## 2013-10-14 NOTE — Progress Notes (Signed)
Patient Discharge Instructions:  Next Level Care Provider Has Access to the EMR, 10/14/13 Do documentation was faxed for [facility]IPS.  Per the SW the patient refused follow up.  Jerelene ReddenSheena E Alsea, 10/14/2013, 1:58 PM

## 2013-11-22 ENCOUNTER — Encounter (HOSPITAL_BASED_OUTPATIENT_CLINIC_OR_DEPARTMENT_OTHER): Payer: Self-pay | Admitting: Emergency Medicine

## 2013-11-22 ENCOUNTER — Emergency Department (HOSPITAL_BASED_OUTPATIENT_CLINIC_OR_DEPARTMENT_OTHER)
Admission: EM | Admit: 2013-11-22 | Discharge: 2013-11-22 | Disposition: A | Discharge status: Home / Self Care | Payer: BC Managed Care – PPO | Attending: Emergency Medicine | Admitting: Emergency Medicine

## 2013-11-22 DIAGNOSIS — J3489 Other specified disorders of nose and nasal sinuses: Secondary | ICD-10-CM | POA: Insufficient documentation

## 2013-11-22 DIAGNOSIS — F411 Generalized anxiety disorder: Secondary | ICD-10-CM | POA: Insufficient documentation

## 2013-11-22 DIAGNOSIS — F329 Major depressive disorder, single episode, unspecified: Secondary | ICD-10-CM | POA: Insufficient documentation

## 2013-11-22 DIAGNOSIS — Z792 Long term (current) use of antibiotics: Secondary | ICD-10-CM | POA: Insufficient documentation

## 2013-11-22 DIAGNOSIS — F3289 Other specified depressive episodes: Secondary | ICD-10-CM | POA: Insufficient documentation

## 2013-11-22 DIAGNOSIS — Z87448 Personal history of other diseases of urinary system: Secondary | ICD-10-CM | POA: Insufficient documentation

## 2013-11-22 DIAGNOSIS — Z87891 Personal history of nicotine dependence: Secondary | ICD-10-CM | POA: Insufficient documentation

## 2013-11-22 DIAGNOSIS — R0982 Postnasal drip: Secondary | ICD-10-CM | POA: Insufficient documentation

## 2013-11-22 DIAGNOSIS — K219 Gastro-esophageal reflux disease without esophagitis: Secondary | ICD-10-CM | POA: Insufficient documentation

## 2013-11-22 DIAGNOSIS — H00024 Hordeolum internum left upper eyelid: Secondary | ICD-10-CM

## 2013-11-22 DIAGNOSIS — H00029 Hordeolum internum unspecified eye, unspecified eyelid: Secondary | ICD-10-CM | POA: Insufficient documentation

## 2013-11-22 DIAGNOSIS — Z79899 Other long term (current) drug therapy: Secondary | ICD-10-CM | POA: Insufficient documentation

## 2013-11-22 MED ORDER — ERYTHROMYCIN 5 MG/GM OP OINT: 1.0000 "application " | TOPICAL_OINTMENT | Freq: Three times a day (TID) | OPHTHALMIC | Status: DC

## 2013-11-22 NOTE — ED Notes (Signed)
C/o stye to left upper eye lid x 4 days-also c/o "cellulitis in my legs"-[facility] pain pills x 15 days

## 2013-11-22 NOTE — Discharge Instructions (Signed)
Sty A sty (hordeolum) is an infection of a gland in the eyelid located at the base of the eyelash. A sty may develop a white or yellow head of pus. It can be puffy (swollen). Usually, the sty will burst and pus will come out on its own. They do not leave lumps in the eyelid once they drain. A sty is often confused with another form of cyst of the eyelid called a chalazion. Chalazions occur within the eyelid and not on the edge where the bases of the eyelashes are. They often are red, sore and then form firm lumps in the eyelid. CAUSES   Germs (bacteria).  Lasting (chronic) eyelid inflammation. SYMPTOMS   Tenderness, redness and swelling along the edge of the eyelid at the base of the eyelashes.  Sometimes, there is a white or yellow head of pus. It may or may not drain. DIAGNOSIS  An ophthalmologist will be able to distinguish between a sty and a chalazion and treat the condition appropriately.  TREATMENT   Styes are typically treated with warm packs (compresses) until drainage occurs.  In rare cases, medicines that kill germs (antibiotics) may be prescribed. These antibiotics may be in the form of drops, cream or pills.  If a hard lump has formed, it is generally necessary to do a small incision and remove the hardened contents of the cyst in a minor surgical procedure done in the office.  In suspicious cases, your caregiver may send the contents of the cyst to the lab to be certain that it is not a rare, but dangerous form of cancer of the glands of the eyelid. HOME CARE INSTRUCTIONS   Wash your hands often and dry them with a clean towel. Avoid touching your eyelid. This may spread the infection to other parts of the eye.  Apply heat to your eyelid for 10 to 20 minutes, several times a day, to ease pain and help to heal it faster.  Do not squeeze the sty. Allow it to drain on its own. Wash your eyelid carefully 3 to 4 times per day to remove any pus. SEEK IMMEDIATE MEDICAL CARE IF:    Your eye becomes painful or puffy (swollen).  Your vision changes.  Your sty does not drain by itself within 3 days.  Your sty comes back within a short period of time, even with treatment.  You have redness (inflammation) around the eye.  You have a fever. Document Released: 03/09/2005 Document Revised: 08/22/2011 Document Reviewed: 11/11/2008 Exit[employer] Patient Information 2014 [employer].  

## 2013-11-22 NOTE — ED Provider Notes (Signed)
CSN: [id number]     Arrival date & time 11/22/13  1737 History   First MD Initiated Contact with Patient 11/22/13 1750     Chief Complaint  Patient presents with  . Stye   HPI Comments: Patient is a 63 y.o. Male who [facility] for stye x 1 day.  Patient states that he noticed a swelling on his eyelid today when he looked in the mirror.  Patient states that this swelling is associated with some tearing of the eye, occasionally blurry vision, and an intermittent headache, congestion, and clear rhinorrhea.  Patient denies any eye discharge or redness, pain with occular movement, fevers, nausea, vomiting, changes in bowel or bladder habits, ear pain, or loss of vision.  Patient has tried warm compresses today with little relief.  He has a remote history of a stye many years ago which was successfully treated with antibiotic ointment.  Patient is also requesting new compression stockings here today for his history of PVD.      The history is provided by the patient. No language interpreter was used.    Past Medical History  Diagnosis Date  . Reflux   . Lower extremity venous stasis   . Genital herpes   . GERD (gastroesophageal reflux disease)   . Hep C w/o coma, chronic   . Anxiety   . Depression   . ED (erectile dysfunction)   . Heroin abuse   . Alcohol abuse    Past Surgical History  Procedure Laterality Date  . Tonsillectomy    . Orbital fracture surgery    . Cholecystectomy    . I&d extremity  06/09/2012    Procedure: IRRIGATION AND DEBRIDEMENT EXTREMITY;  Surgeon: Sharma CovertFred W Ortmann, MD;  [facility];  Service: Orthopedics;  Laterality: Left;   Family History  Problem Relation Age of Onset  . Stroke Mother   . Heart disease Father    History  Substance Use Topics  . Smoking status: Former Games developermoker  . Smokeless tobacco: Never Used  . Alcohol Use: No     Comment: in rehab    Review of Systems  Constitutional: Negative for fever, chills and fatigue.  HENT:  Positive for congestion, postnasal drip and rhinorrhea. Negative for ear pain, facial swelling, sinus pressure and sore throat.   Eyes: Positive for visual disturbance. Negative for photophobia, pain, discharge, redness and itching.  Respiratory: Negative for chest tightness and shortness of breath.   Cardiovascular: Negative for chest pain and palpitations.  All other systems reviewed and are negative.     Allergies  Review of patient's allergies indicates no known allergies.  Home Medications   Prior to Admission medications   Medication Sig Start Date End Date Taking? Authorizing Provider  OMEPRAZOLE PO Take by mouth.   Yes Historical Provider, MD  ValACYclovir HCl (VALTREX PO) Take by mouth.   Yes Historical Provider, MD  cephALEXin (KEFLEX) 500 MG capsule Take 1 capsule (500 mg total) by mouth 4 (four) times daily. For infection 10/09/13   Sanjuana KavaAgnes I Nwoko, NP  escitalopram (LEXAPRO) 10 MG tablet Take 1 tablet (10 mg total) by mouth daily. For depression 10/09/13   Sanjuana KavaAgnes I Nwoko, NP  esomeprazole (NEXIUM) 20 MG capsule Take 1 capsule (20 mg total) by mouth daily at 12 noon. For acid reflux 10/09/13   Sanjuana KavaAgnes I Nwoko, NP  hydrOXYzine (ATARAX/VISTARIL) 25 MG tablet Take 1 tablet (25 mg) three times daily as needed: For anxiety 10/09/13   Nelda MarseilleAgnes I  Nwoko, NP  Multiple Vitamin (MULTIVITAMIN WITH MINERALS) TABS tablet Take 1 tablet by mouth daily. For low vitamin 10/09/13   Sanjuana KavaAgnes I Nwoko, NP  traZODone (DESYREL) 150 MG tablet Take 1 tablet (150 mg total) by mouth at bedtime. For sleep 10/09/13   Sanjuana KavaAgnes I Nwoko, NP   BP 143/82  Pulse 77  Temp(Src) 98.5 F (36.9 C) (Oral)  Resp 16  Ht 5\' 11"  (1.803 m)  Wt 175 lb (79.379 kg)  BMI 24.42 kg/m2  SpO2 100% Physical Exam  Nursing note and vitals reviewed. Constitutional: He is oriented to person, place, and time. He appears well-developed and well-nourished. No distress.  HENT:  Head: Normocephalic and atraumatic.  Right Ear: External ear normal.    Left Ear: External ear normal.  Nose: Mucosal edema present.  Mouth/Throat: Oropharynx is clear and moist.  Eyes: Conjunctivae, EOM and lids are normal. Pupils are equal, round, and reactive to light. Lids are everted and swept, no foreign bodies found. Left eye exhibits no chemosis, no discharge and no exudate. No foreign body present in the left eye.    Neck: Normal range of motion. Neck supple.  Cardiovascular: Normal rate, regular rhythm, normal heart sounds and intact distal pulses.  Exam reveals no gallop and no friction rub.   No murmur heard. Pulmonary/Chest: Effort normal and breath sounds normal. No respiratory distress. He has no wheezes. He has no rales. He exhibits no tenderness.  Abdominal: Soft. Bowel sounds are normal.  Musculoskeletal: Normal range of motion.  Lymphadenopathy:    He has no cervical adenopathy.  Neurological: He is alert and oriented to person, place, and time.  Skin: Skin is warm and dry. He is not diaphoretic.  Psychiatric: He has a normal mood and affect. His behavior is normal. Judgment and thought content normal.    ED Course  Procedures (including critical care time) Labs Review Labs Reviewed - No data to display  Imaging Review No results found.   EKG Interpretation None      MDM   Final diagnoses:  Hordeolum internum of left upper eyelid   [facility] with complaint of stye x 1 day.  History and physical exam consistent with a hordeolum.  Visual acuity here is WNL with his corrective lenses.  No foreign bodies seen in the eye.  No painful EOM.  No periorbital swelling or erythema.  Will discharge home with erythromycin optic ointment and to use warm compresses as much as he can tolerate and gentle massage.  Strict return precautions of changes or loss of vision, pain movement of the eyes, or any other concerning symptoms were given to the patient.  He was told to follow up with Dr. Thea SilversmithMackenzie on Monday to confirm  resolution of symptoms.  Have told the patient to follow up with Dr. Thea SilversmithMackenzie for prescription for compression hose.      Clydie Braunourtney A Forucci, PA-C 11/22/13 1851

## 2013-11-23 NOTE — ED Provider Notes (Signed)
Medical screening examination/treatment/procedure(s) were performed by non-physician practitioner and as supervising physician I was immediately available for consultation/collaboration.   EKG Interpretation None        Beckett Hickmon, MD 11/23/13 1620 

## 2014-01-03 ENCOUNTER — Encounter (HOSPITAL_BASED_OUTPATIENT_CLINIC_OR_DEPARTMENT_OTHER): Payer: Self-pay | Admitting: Emergency Medicine

## 2014-01-03 ENCOUNTER — Emergency Department (HOSPITAL_BASED_OUTPATIENT_CLINIC_OR_DEPARTMENT_OTHER)
Admission: EM | Admit: 2014-01-03 | Discharge: 2014-01-03 | Disposition: A | Discharge status: Home / Self Care | Payer: BC Managed Care – PPO | Attending: Emergency Medicine | Admitting: Emergency Medicine

## 2014-01-03 DIAGNOSIS — K219 Gastro-esophageal reflux disease without esophagitis: Secondary | ICD-10-CM | POA: Insufficient documentation

## 2014-01-03 DIAGNOSIS — Z792 Long term (current) use of antibiotics: Secondary | ICD-10-CM | POA: Insufficient documentation

## 2014-01-03 DIAGNOSIS — N529 Male erectile dysfunction, unspecified: Secondary | ICD-10-CM | POA: Insufficient documentation

## 2014-01-03 DIAGNOSIS — N509 Disorder of male genital organs, unspecified: Secondary | ICD-10-CM

## 2014-01-03 DIAGNOSIS — Z8619 Personal history of other infectious and parasitic diseases: Secondary | ICD-10-CM | POA: Insufficient documentation

## 2014-01-03 DIAGNOSIS — F3289 Other specified depressive episodes: Secondary | ICD-10-CM | POA: Insufficient documentation

## 2014-01-03 DIAGNOSIS — Z79899 Other long term (current) drug therapy: Secondary | ICD-10-CM | POA: Insufficient documentation

## 2014-01-03 DIAGNOSIS — F411 Generalized anxiety disorder: Secondary | ICD-10-CM | POA: Insufficient documentation

## 2014-01-03 DIAGNOSIS — Z87891 Personal history of nicotine dependence: Secondary | ICD-10-CM | POA: Insufficient documentation

## 2014-01-03 DIAGNOSIS — F329 Major depressive disorder, single episode, unspecified: Secondary | ICD-10-CM | POA: Insufficient documentation

## 2014-01-03 DIAGNOSIS — I872 Venous insufficiency (chronic) (peripheral): Secondary | ICD-10-CM | POA: Insufficient documentation

## 2014-01-03 DIAGNOSIS — N508 Other specified disorders of male genital organs: Secondary | ICD-10-CM | POA: Insufficient documentation

## 2014-01-03 MED ORDER — LORATADINE 10 MG PO TABS: 10.0000 mg | ORAL_TABLET | Freq: Every day | ORAL | Status: DC | PRN

## 2014-01-03 MED ORDER — VALACYCLOVIR HCL 500 MG PO TABS: 500.0000 mg | ORAL_TABLET | Freq: Two times a day (BID) | ORAL | Status: AC

## 2014-01-03 MED ORDER — CLINDAMYCIN HCL 150 MG PO CAPS: 300.0000 mg | ORAL_CAPSULE | Freq: Three times a day (TID) | ORAL | Status: DC

## 2014-01-03 NOTE — Discharge Instructions (Signed)
Follow up with your primary doctor.  If symptoms worsen, you develop fevers, you have redness spreading from lesion, or you have other concerning symptoms, return to the ED.

## 2014-01-03 NOTE — ED Provider Notes (Signed)
CSN: [id number]     Arrival date & time 01/03/14  4098 History   First MD Initiated Contact with Patient 01/03/14 [phone]     Chief Complaint  Patient presents with  . Abscess     (Consider location/radiation/quality/duration/timing/severity/associated sxs/prior Treatment) Patient is a 63 y.o. male presenting with rash.  Rash Location:  Ano-genital Ano-genital rash location:  Scrotum Quality: painful and redness   Pain details:    Quality:  Aching   Severity:  Moderate   Onset quality:  Gradual   Duration:  3 days   Timing:  Constant   Progression:  Worsening Severity:  Moderate Chronicity:  New Context comment:  Spontaneous Relieved by: soaks. Worsened by:  Nothing tried Associated symptoms: no abdominal pain and no fever   Associated symptoms comment:  No penile dischareg   Past Medical History  Diagnosis Date  . Reflux   . Lower extremity venous stasis   . Genital herpes   . GERD (gastroesophageal reflux disease)   . Hep C w/o coma, chronic   . Anxiety   . Depression   . ED (erectile dysfunction)   . Heroin abuse   . Alcohol abuse    Past Surgical History  Procedure Laterality Date  . Tonsillectomy    . Orbital fracture surgery    . Cholecystectomy    . I&d extremity  06/09/2012    Procedure: IRRIGATION AND DEBRIDEMENT EXTREMITY;  Surgeon: Sharma Covert, MD;  Location: [facility] OR;  Service: Orthopedics;  Laterality: Left;   Family History  Problem Relation Age of Onset  . Stroke Mother   . Heart disease Father    History  Substance Use Topics  . Smoking status: Former Games developer  . Smokeless tobacco: Never Used  . Alcohol Use: No     Comment: in rehab    Review of Systems  Constitutional: Negative for fever.  Gastrointestinal: Negative for abdominal pain.  Skin: Positive for rash.  All other systems reviewed and are negative.     Allergies  Review of patient's allergies indicates no known allergies.  Home Medications   Prior to Admission  medications   Medication Sig Start Date End Date Taking? Authorizing Provider  cephALEXin (KEFLEX) 500 MG capsule Take 1 capsule (500 mg total) by mouth 4 (four) times daily. For infection 10/09/13   Sanjuana Kava, NP  clindamycin (CLEOCIN) 150 MG capsule Take 2 capsules (300 mg total) by mouth 3 (three) times daily. 01/03/14   Candyce Churn III, MD  erythromycin [facility]) ophthalmic ointment Place 1 application into the left eye 3 (three) times daily. 11/22/13   Courtney A Forcucci, PA-C  escitalopram (LEXAPRO) 10 MG tablet Take 1 tablet (10 mg total) by mouth daily. For depression 10/09/13   Sanjuana Kava, NP  esomeprazole (NEXIUM) 20 MG capsule Take 1 capsule (20 mg total) by mouth daily at 12 noon. For acid reflux 10/09/13   Sanjuana Kava, NP  hydrOXYzine (ATARAX/VISTARIL) 25 MG tablet Take 1 tablet (25 mg) three times daily as needed: For anxiety 10/09/13   Sanjuana Kava, NP  loratadine (CLARITIN) 10 MG tablet Take 1 tablet (10 mg total) by mouth daily as needed for allergies. 01/03/14   Candyce Churn III, MD  Multiple Vitamin (MULTIVITAMIN WITH MINERALS) TABS tablet Take 1 tablet by mouth daily. For low vitamin 10/09/13   Sanjuana Kava, NP  OMEPRAZOLE PO Take by mouth.    Historical Provider, MD  traZODone (DESYREL) 150 MG tablet Take 1  tablet (150 mg total) by mouth at bedtime. For sleep 10/09/13   Sanjuana KavaAgnes I Nwoko, NP  valACYclovir (VALTREX) 500 MG tablet Take 1 tablet (500 mg total) by mouth 2 (two) times daily. 01/03/14 01/17/14  Candyce ChurnJohn David Hezakiah Champeau III, MD  ValACYclovir HCl (VALTREX PO) Take by mouth.    Historical Provider, MD   BP 138/71  Pulse 79  Temp(Src) 97.8 F (36.6 C) (Oral)  Resp 18  Ht 5\' 11"  (1.803 m)  Wt 178 lb 5 oz (80.882 kg)  BMI 24.88 kg/m2  SpO2 100% Physical Exam  Nursing note and vitals reviewed. Constitutional: He is oriented to person, place, and time. He appears well-developed and well-nourished. No distress.  HENT:  Head: Normocephalic and atraumatic.  Eyes:  Conjunctivae are normal. No scleral icterus.  Neck: Neck supple.  Cardiovascular: Normal rate and intact distal pulses.   Pulmonary/Chest: Effort normal. No stridor. No respiratory distress.  Abdominal: Normal appearance. He exhibits no distension.  Genitourinary:     Neurological: He is alert and oriented to person, place, and time.  Skin: Skin is warm and dry. No rash noted.  Psychiatric: He has a normal mood and affect. His behavior is normal.    ED Course  Procedures (including critical care time) Labs Review Labs Reviewed - No data to display  Imaging Review No results found.   EKG Interpretation None      MDM   Final diagnoses:  Scrotal lesion    63 yo male with small nodular appearing lesion on scrotum.  No fluctuance.  Could be cellulitis/early abscess, but nothing to drain today.  He has a history of herpes, but this lesion is isolated and does not appear vesicular.  Not consistent with chancre (lesion painful) and he reports monogamous relationship.  Plan to treat with abx and have him follow up closely with PCP for recheck.    He requests refill for his Valtrex and Claritin.  Will provide 1 week course, advised to discuss further medication refills with his PCP.      Candyce ChurnJohn David Jarett Dralle III, MD 01/03/14 [phone]

## 2014-01-03 NOTE — ED Notes (Signed)
Pt reports boil to left testicle also sts boil to left foot.

## 2014-02-25 ENCOUNTER — Encounter: Payer: Self-pay | Admitting: Gastroenterology

## 2014-03-03 ENCOUNTER — Ambulatory Visit: Payer: Self-pay

## 2014-03-04 ENCOUNTER — Other Ambulatory Visit: Payer: BC Managed Care – PPO

## 2014-03-04 DIAGNOSIS — B182 Chronic viral hepatitis C: Secondary | ICD-10-CM

## 2014-03-04 LAB — PROTIME-INR
INR: 1.03 (ref <1.50)
Prothrombin Time: 13.5 seconds (ref 11.6–15.2)

## 2014-03-05 LAB — ANA: Anti Nuclear Antibody(ANA): NEGATIVE

## 2014-03-05 LAB — IRON: Iron: 118 ug/dL (ref 42–165)

## 2014-03-05 LAB — HEPATITIS B SURFACE ANTIBODY,QUALITATIVE: HEP B S AB: NEGATIVE

## 2014-03-05 LAB — HEPATITIS B CORE ANTIBODY, TOTAL: HEP B C TOTAL AB: REACTIVE — AB

## 2014-03-05 LAB — HEPATITIS C RNA QUANTITATIVE
HCV QUANT LOG: 6.72 {Log} — AB (ref <1.18)
HCV Quantitative: 5205894 IU/mL — ABNORMAL HIGH (ref <15)

## 2014-03-05 LAB — HIV ANTIBODY (ROUTINE TESTING W REFLEX): HIV 1&2 Ab, 4th Generation: NONREACTIVE

## 2014-03-05 LAB — HEPATITIS B SURFACE ANTIGEN: HEP B S AG: NEGATIVE

## 2014-03-05 LAB — HEPATITIS A ANTIBODY, TOTAL: HEP A TOTAL AB: REACTIVE — AB

## 2014-03-07 LAB — HEPATITIS C GENOTYPE

## 2014-03-13 ENCOUNTER — Encounter: Payer: Self-pay | Admitting: Internal Medicine

## 2014-03-13 ENCOUNTER — Ambulatory Visit (INDEPENDENT_AMBULATORY_CARE_PROVIDER_SITE_OTHER): Payer: BC Managed Care – PPO | Admitting: Internal Medicine

## 2014-03-13 VITALS — BP 126/78 | HR 87 | Temp 97.6°F | Ht 70.0 in | Wt 174.0 lb

## 2014-03-13 DIAGNOSIS — B182 Chronic viral hepatitis C: Secondary | ICD-10-CM | POA: Diagnosis not present

## 2014-03-13 NOTE — Progress Notes (Signed)
+Bryan Powell is a 63 y.o. male who presents for initial evaluation and management of a positive Hepatitis C antibody test.  Patient tested positive about 15 years ago. Hepatitis C risk factors present are: IV drug abuse (details: no IV drugs for many years). Patient denies renal dialysis, sexual contact with person with liver disease, tattoos. Patient has had other studies performed. Results: hepatitis C RNA by PCR, result: positive. Patient has not had prior treatment for Hepatitis C. Patient does not have a past history of liver disease. Patient does not have a family history of liver disease.   HPI: He has had several friends die of liver cancer.  He has recently used drugs, now about 6 months ago and went through a court mandated drug program and has completed it.  He does not drink.  No longer smokes.  History of heroin use.    Patient does have documented immunity to Hepatitis A. Patient does have documented immunity to Hepatitis B.     Review of Systems A comprehensive review of systems was negative.   Past Medical History  Diagnosis Date  . Reflux   . Lower extremity venous stasis   . Genital herpes   . GERD (gastroesophageal reflux disease)   . Hep C w/o coma, chronic   . Anxiety   . Depression   . ED (erectile dysfunction)   . Heroin abuse   . Alcohol abuse     Prior to Admission medications   Medication Sig Start Date End Date Taking? Authorizing Provider  esomeprazole (NEXIUM) 20 MG capsule Take 1 capsule (20 mg total) by mouth daily at 12 noon. For acid reflux 10/09/13  Yes Sanjuana KavaAgnes I Nwoko, NP  loratadine (CLARITIN) 10 MG tablet Take 1 tablet (10 mg total) by mouth daily as needed for allergies. 01/03/14  Yes Candyce ChurnJohn David Wofford III, MD  Multiple Vitamin (MULTIVITAMIN WITH MINERALS) TABS tablet Take 1 tablet by mouth daily. For low vitamin 10/09/13  Yes Sanjuana KavaAgnes I Nwoko, NP  Probiotic Product (SOLUBLE FIBER/PROBIOTICS PO) Take 1 capsule by mouth daily.   Yes Historical  Provider, MD  traZODone (DESYREL) 150 MG tablet Take 1 tablet (150 mg total) by mouth at bedtime. For sleep 10/09/13  Yes Sanjuana KavaAgnes I Nwoko, NP  ValACYclovir HCl (VALTREX PO) Take by mouth.   Yes Historical Provider, MD    No Known Allergies  History  Substance Use Topics  . Smoking status: Former Games developermoker  . Smokeless tobacco: Never Used  . Alcohol Use: No     Comment: in rehab    Family History  Problem Relation Age of Onset  . Stroke Mother   . Heart disease Father    No liver cancer   Objective:   Filed Vitals:   03/13/14 1139  BP: 126/78  Pulse: 87  Temp: 97.6 F (36.4 C)   in no apparent distress and alert HEENT: anicteric Cor RRR and No murmurs clear Bowel sounds are normal, liver is not enlarged, spleen is not enlarged peripheral pulses normal, no pedal edema, no clubbing or cyanosis negative for - jaundice, spider hemangioma, telangiectasia, palmar erythema, ecchymosis and atrophy  Laboratory Genotype:  Lab Results  Component Value Date   HCVGENOTYPE 1a 03/04/2014   HCV viral load:  Lab Results  Component Value Date   HCVQUANT [id number]* 03/04/2014   Lab Results  Component Value Date   WBC 6.8 10/04/2013   HGB 15.1 10/04/2013   HCT 44.7 10/04/2013   MCV 84.5 10/04/2013   PLT 190  10/04/2013    Lab Results  Component Value Date   CREATININE 0.58 10/04/2013   BUN 7 10/04/2013   NA 141 10/04/2013   K 3.6* 10/04/2013   CL 103 10/04/2013   CO2 23 10/04/2013    Lab Results  Component Value Date   ALT 25 10/04/2013   AST 24 10/04/2013   ALKPHOS 67 10/04/2013   BILITOT 0.5 10/04/2013   INR 1.03 03/04/2014      Assessment: Hepatitis C genotype 1a  Plan: 1) Patient counseled extensively on limiting acetaminophen to no more than 2 grams daily, avoidance of alcohol. 2) Transmission discussed with patient including sexual transmission, sharing razors and toothbrush.   3) Will need referral to gastroenterology if concern for cirrhosis 4) Will need referral for  substance abuse counseling: Yes.  unless he can find his certificate of completion.   5) Will prescribe Harvoni for 12 weeks once work up complete 6) Hepatitis A vaccine No. 7) Hepatitis B vaccine No. 8) Pneumovax vaccine if concern for cirrhosis 9) will follow up after elastography and after starting medication or in 1 year if denied

## 2014-03-27 ENCOUNTER — Other Ambulatory Visit: Payer: Self-pay | Admitting: Internal Medicine

## 2014-03-27 ENCOUNTER — Ambulatory Visit (HOSPITAL_COMMUNITY)
Admission: RE | Admit: 2014-03-27 | Discharge: 2014-03-27 | Disposition: A | Discharge status: Home / Self Care | Payer: BC Managed Care – PPO | Source: Ambulatory Visit | Attending: Internal Medicine | Admitting: Internal Medicine

## 2014-03-27 ENCOUNTER — Ambulatory Visit (HOSPITAL_COMMUNITY): Payer: BC Managed Care – PPO

## 2014-03-27 DIAGNOSIS — B182 Chronic viral hepatitis C: Secondary | ICD-10-CM | POA: Diagnosis present

## 2014-03-27 MED ORDER — LEDIPASVIR-SOFOSBUVIR 90-400 MG PO TABS: 1.0000 | ORAL_TABLET | Freq: Every day | ORAL | Status: DC

## 2014-03-31 ENCOUNTER — Ambulatory Visit: Payer: Self-pay | Admitting: Internal Medicine

## 2014-04-10 ENCOUNTER — Ambulatory Visit (INDEPENDENT_AMBULATORY_CARE_PROVIDER_SITE_OTHER): Payer: BC Managed Care – PPO | Admitting: Internal Medicine

## 2014-04-10 ENCOUNTER — Encounter: Payer: Self-pay | Admitting: Internal Medicine

## 2014-04-10 VITALS — BP 128/75 | HR 89 | Temp 98.5°F | Wt 180.0 lb

## 2014-04-10 DIAGNOSIS — B182 Chronic viral hepatitis C: Secondary | ICD-10-CM

## 2014-04-10 NOTE — Progress Notes (Signed)
   Subjective:    [name], male    DOB: 11/28/1950, 63 y.o.   MRN: [id number]  HPI He he is here for follow-up of hepatitis C. He has gentoype 1 a and initial viral load of [id number].  He had elastography of F2/F3.  He has completed a substance abuse program and has been drug free for 6 months. He has no complaints.   Review of Systems  Constitutional: Negative for fatigue.  Gastrointestinal: Negative for nausea and diarrhea.  Skin: Negative for rash.  Neurological: Negative for dizziness.       Objective:   Physical Exam        Assessment & Plan:

## 2014-04-10 NOTE — Assessment & Plan Note (Addendum)
I discussed his results of elastography and is a level 2/3 fibrosis. These results were discussed with him. I also discussed the implications of having some liver damage but not cirrhosis. I discussed with him the process of getting the medication. QUESTIONS answered. He provided me documentation of [facility] drug [facility] and treatment [facility]. He will return after getting the medication or one year if he is denied.

## 2014-04-15 ENCOUNTER — Ambulatory Visit: Payer: BC Managed Care – PPO

## 2014-04-15 ENCOUNTER — Encounter (HOSPITAL_COMMUNITY): Payer: Self-pay | Admitting: *Deleted

## 2014-04-15 VITALS — Ht 70.5 in | Wt 179.6 lb

## 2014-04-15 DIAGNOSIS — Z1211 Encounter for screening for malignant neoplasm of colon: Secondary | ICD-10-CM

## 2014-04-15 MED ORDER — NA SULFATE-K SULFATE-MG SULF 17.5-3.13-1.6 GM/177ML PO SOLN: ORAL | Status: DC

## 2014-04-15 NOTE — Progress Notes (Signed)
Pt came into the office today for his pre-visit prior to his colonoscopy on 04/29/14 with Dr Arlyce DiceKaplan.Pt states he is extremely hard to stick for an IV  and was concerned about having any problems prior to his colonoscopy.I spoke with French Anaracy (RN) and the supervisor and the pt was then [facility]. The hospital was made aware of a difficulty stick also.The pt was relieved, and will call back if he has further questions.[facility] was cancelled.

## 2014-04-17 ENCOUNTER — Encounter: Payer: Self-pay | Admitting: Gastroenterology

## 2014-04-17 ENCOUNTER — Telehealth: Payer: Self-pay | Admitting: Gastroenterology

## 2014-04-17 NOTE — Telephone Encounter (Signed)
What was he's scheduled for and why?

## 2014-04-17 NOTE — Telephone Encounter (Signed)
Okay to defer colonoscopy

## 2014-04-17 NOTE — Telephone Encounter (Signed)
Screening colonoscopy - recalled I think

## 2014-04-17 NOTE — Telephone Encounter (Signed)
Okay to cancel

## 2014-04-18 NOTE — Telephone Encounter (Signed)
Procedure cancelled.

## 2014-04-22 ENCOUNTER — Ambulatory Visit (HOSPITAL_COMMUNITY)
Admission: RE | Admit: 2014-04-22 | Payer: BC Managed Care – PPO | Source: Ambulatory Visit | Admitting: Gastroenterology

## 2014-04-22 HISTORY — DX: Other specified disorders of veins: I87.8

## 2014-04-22 SURGERY — COLONOSCOPY WITH PROPOFOL
Anesthesia: Monitor Anesthesia Care

## 2014-04-29 ENCOUNTER — Encounter: Payer: Self-pay | Admitting: Gastroenterology

## 2014-05-19 ENCOUNTER — Encounter: Payer: Self-pay | Admitting: Internal Medicine

## 2014-05-19 ENCOUNTER — Ambulatory Visit (INDEPENDENT_AMBULATORY_CARE_PROVIDER_SITE_OTHER): Payer: BC Managed Care – PPO | Admitting: Internal Medicine

## 2014-05-19 VITALS — BP 125/76 | HR 93 | Temp 98.2°F | Ht 70.5 in | Wt 189.0 lb

## 2014-05-19 DIAGNOSIS — B182 Chronic viral hepatitis C: Secondary | ICD-10-CM

## 2014-05-19 LAB — COMPLETE METABOLIC PANEL WITH GFR
ALT: 91 U/L — AB (ref 0–53)
AST: 44 U/L — AB (ref 0–37)
Albumin: 3.9 g/dL (ref 3.5–5.2)
Alkaline Phosphatase: 55 U/L (ref 39–117)
BUN: 16 mg/dL (ref 6–23)
CHLORIDE: 103 meq/L (ref 96–112)
CO2: 23 meq/L (ref 19–32)
CREATININE: 0.78 mg/dL (ref 0.50–1.35)
Calcium: 9.1 mg/dL (ref 8.4–10.5)
GFR, Est Non African American: >89 mL/min
Glucose, Bld: 105 mg/dL — ABNORMAL HIGH (ref 70–99)
Potassium: 4 mEq/L (ref 3.5–5.3)
SODIUM: 134 meq/L — AB (ref 135–145)
TOTAL PROTEIN: 6.8 g/dL (ref 6.0–8.3)
Total Bilirubin: 0.8 mg/dL (ref 0.2–1.2)

## 2014-05-19 NOTE — Progress Notes (Signed)
   Subjective:    [name], male    DOB: 07/26/1950, 63 y.o.   MRN: [id number]  HPI Here for hepatitis C follow up.  Genotype 1a, HCV RNA [id number].  Started Harvoni 28 days ago.  Feels well and in fact more energy.  Mild headache x 2.  F2/F3.     Review of Systems  Constitutional: Negative for fever and fatigue.  Gastrointestinal: Negative for nausea and diarrhea.  Skin: Negative for rash.  Neurological: Positive for headaches. Negative for dizziness.       Objective:   Physical Exam  Constitutional: He appears well-developed and well-nourished. No distress.  Eyes: No scleral icterus.  Cardiovascular: Normal rate, regular rhythm and normal heart sounds.   No murmur heard. Pulmonary/Chest: Effort normal and breath sounds normal. No respiratory distress. He has no wheezes.  Skin: No rash noted.          Assessment & Plan:

## 2014-05-21 LAB — HEPATITIS C RNA QUANTITATIVE: HCV QUANT: NOT DETECTED [IU]/mL (ref <15)

## 2014-07-03 ENCOUNTER — Telehealth: Payer: Self-pay | Admitting: *Deleted

## 2014-07-03 NOTE — Telephone Encounter (Signed)
Patient is requesting a CT or MRI to evaluate for liver cancer.  He states that he is concerned, as he has had 2 friends die recently of the disease, and in his reading, is aware he is at a higher risk of developing the disease.  He states that his friend's cancer was not found until very late, even though they had ultrasounds.  It was only found through an MRI at the very end. RN advised patient that I would pass the request to Dr. Luciana Axeomer for consideration, and he could follow up at his scheduled appointment. Andree CossHowell, Teah Votaw M, RN

## 2014-07-17 ENCOUNTER — Encounter: Payer: Self-pay | Admitting: Internal Medicine

## 2014-07-17 ENCOUNTER — Ambulatory Visit (INDEPENDENT_AMBULATORY_CARE_PROVIDER_SITE_OTHER): Payer: BC Managed Care – PPO | Admitting: Internal Medicine

## 2014-07-17 VITALS — BP 128/76 | HR 83 | Temp 98.2°F | Ht 71.0 in | Wt 190.0 lb

## 2014-07-17 DIAGNOSIS — B182 Chronic viral hepatitis C: Secondary | ICD-10-CM

## 2014-07-17 LAB — COMPLETE METABOLIC PANEL WITH GFR
ALBUMIN: 4 g/dL (ref 3.5–5.2)
ALT: 99 U/L — ABNORMAL HIGH (ref 0–53)
AST: 37 U/L (ref 0–37)
Alkaline Phosphatase: 62 U/L (ref 39–117)
BUN: 17 mg/dL (ref 6–23)
CALCIUM: 9.1 mg/dL (ref 8.4–10.5)
CHLORIDE: 102 meq/L (ref 96–112)
CO2: 25 mEq/L (ref 19–32)
CREATININE: 0.67 mg/dL (ref 0.50–1.35)
GFR, Est African American: >89 mL/min
GFR, Est Non African American: >89 mL/min
Glucose, Bld: 93 mg/dL (ref 70–99)
Potassium: 4.1 mEq/L (ref 3.5–5.3)
Sodium: 135 mEq/L (ref 135–145)
Total Bilirubin: 0.7 mg/dL (ref 0.2–1.2)
Total Protein: 7 g/dL (ref 6.0–8.3)

## 2014-07-18 NOTE — Progress Notes (Signed)
   Subjective:    [name], male    DOB: 07/28/1950, 64 y.o.   MRN: [id number]  HPI Here for hepatitis C follow up.  Genotype 1a, HCV RNA [id number].  Now finished 12 weeks of Harvoni.  Continues to feel well and in fact more energy.    F2/F3.  He is asking to get an MRI to "make sure" he doesn't have liver cancer since he has friends with liver cancer and is concerned.     Review of Systems  Constitutional: Negative for fever and fatigue.  Gastrointestinal: Negative for nausea and diarrhea.  Skin: Negative for rash.  Neurological: Negative for dizziness and headaches.       Objective:   Physical Exam  Constitutional: He appears well-developed and well-nourished. No distress.  Eyes: No scleral icterus.  Cardiovascular: Normal rate, regular rhythm and normal heart sounds.   No murmur heard. Pulmonary/Chest: Effort normal and breath sounds normal. No respiratory distress. He has no wheezes.  Skin: No rash noted.          Assessment & Plan:

## 2014-07-18 NOTE — Assessment & Plan Note (Addendum)
No off therapy, feeling well.  Will check end of treatment viral load.  I did recheck transaminases and results show it is stable.  It increased during therapy and likely related to Harvoni.  Will need to recheck in 3 months at his end of treatment SVR12.    I also discussed the risks of liver cancer and no MRI indicated since he does not have cirrhosis.  I discussed that he will need repeat elastography in 1 year but no indication for any HCC screening.    25 minutes spent with the patient including 15 minutes of counseling on HCC risks and LFTs.

## 2014-07-21 ENCOUNTER — Telehealth: Payer: Self-pay | Admitting: *Deleted

## 2014-07-21 NOTE — Telephone Encounter (Signed)
-----   Message from Gardiner Barefootobert W Comer, MD sent at 07/18/2014 11:38 AM EST ----- Please let him know that his labs for liver were stable and no concerns requiring further work up.  We will recheck at his visit in 3 months. thanks

## 2014-07-21 NOTE — Telephone Encounter (Signed)
Pt notified. Pt's questions answered. Pt voiced his thanks for everything. Andree CossHowell, Mckenna Boruff M, RN

## 2014-07-22 LAB — HEPATITIS C RNA QUANTITATIVE: HCV Quantitative: NOT DETECTED IU/mL (ref <15)

## 2014-08-03 ENCOUNTER — Encounter (HOSPITAL_COMMUNITY): Payer: Self-pay | Admitting: *Deleted

## 2014-08-03 ENCOUNTER — Emergency Department (HOSPITAL_COMMUNITY)
Admission: EM | Admit: 2014-08-03 | Discharge: 2014-08-03 | Disposition: A | Discharge status: Home / Self Care | Payer: BC Managed Care – PPO | Attending: Emergency Medicine | Admitting: Emergency Medicine

## 2014-08-03 DIAGNOSIS — M79605 Pain in left leg: Secondary | ICD-10-CM | POA: Diagnosis present

## 2014-08-03 DIAGNOSIS — Z87891 Personal history of nicotine dependence: Secondary | ICD-10-CM | POA: Insufficient documentation

## 2014-08-03 DIAGNOSIS — F419 Anxiety disorder, unspecified: Secondary | ICD-10-CM | POA: Insufficient documentation

## 2014-08-03 DIAGNOSIS — Z8619 Personal history of other infectious and parasitic diseases: Secondary | ICD-10-CM | POA: Diagnosis not present

## 2014-08-03 DIAGNOSIS — L03115 Cellulitis of right lower limb: Secondary | ICD-10-CM | POA: Diagnosis not present

## 2014-08-03 DIAGNOSIS — Z87438 Personal history of other diseases of male genital organs: Secondary | ICD-10-CM | POA: Diagnosis not present

## 2014-08-03 DIAGNOSIS — K219 Gastro-esophageal reflux disease without esophagitis: Secondary | ICD-10-CM | POA: Diagnosis not present

## 2014-08-03 DIAGNOSIS — Z791 Long term (current) use of non-steroidal anti-inflammatories (NSAID): Secondary | ICD-10-CM | POA: Diagnosis not present

## 2014-08-03 DIAGNOSIS — F329 Major depressive disorder, single episode, unspecified: Secondary | ICD-10-CM | POA: Diagnosis not present

## 2014-08-03 DIAGNOSIS — Z79899 Other long term (current) drug therapy: Secondary | ICD-10-CM | POA: Diagnosis not present

## 2014-08-03 DIAGNOSIS — L03116 Cellulitis of left lower limb: Secondary | ICD-10-CM | POA: Insufficient documentation

## 2014-08-03 MED ORDER — OXYCODONE HCL 5 MG PO TABS
5.0000 mg | ORAL_TABLET | Freq: Once | ORAL | Status: AC
  Administered 2014-08-03: 5 mg via ORAL
  Filled 2014-08-03: qty 1

## 2014-08-03 MED ORDER — CLINDAMYCIN HCL 150 MG PO CAPS: 300.0000 mg | ORAL_CAPSULE | Freq: Three times a day (TID) | ORAL | Status: DC

## 2014-08-03 MED ORDER — CLINDAMYCIN HCL 300 MG PO CAPS
300.0000 mg | ORAL_CAPSULE | Freq: Once | ORAL | Status: AC
  Administered 2014-08-03: 300 mg via ORAL
  Filled 2014-08-03: qty 2
  Filled 2014-08-03: qty 1

## 2014-08-03 MED ORDER — OXYCODONE HCL 5 MG PO TABS: 5.0000 mg | ORAL_TABLET | ORAL | Status: DC | PRN

## 2014-08-03 MED ORDER — HYDROCODONE-ACETAMINOPHEN 5-325 MG PO TABS: 2.0000 | ORAL_TABLET | Freq: Once | ORAL | Status: DC

## 2014-08-03 NOTE — ED Notes (Signed)
Pt reports already being diagnosed with cellulitis in bilateral legs which is causing swelling and pain. Ambulatory at triage, no acute distress noted.

## 2014-08-03 NOTE — ED Provider Notes (Signed)
CSN: [id number]     Arrival date & time 08/03/14  1349 History   First MD Initiated Contact with Patient 08/03/14 1404     Chief Complaint  Patient presents with  . Cellulitis  . Leg Pain     (Consider location/radiation/quality/duration/timing/severity/associated sxs/prior Treatment) Patient is a 64 y.o. male presenting with leg pain. The history is provided by the patient and medical records.  Leg Pain   This is a 64 y.o. M with PMH significant for LE venous stasis, GERD, hep C, anxiety, depression, presenting to the ED for possible recurrent cellulitis.  Patient states he has a hx of MRSA and recurrent cellulitis notably occuring on face and LE in the past.  He states last episode was a few months ago.  States he recently scraped his BLE while working and states a few days ago he developed some redness surrounding scabs on his shins.  States this time, redness has gotten more intense and legs have become somewhat painful, throbbing in nature.  He does have hx of MRSA in the past.  He denies any fever or chills.  No hx of DVT or PE.  No chest pain, SOB, abdominal pain, nausea, vomiting, diarrhea.  VSS on arrival.  Past Medical History  Diagnosis Date  . Reflux   . Lower extremity venous stasis   . Genital herpes   . GERD (gastroesophageal reflux disease)   . Hep C w/o coma, chronic   . Anxiety   . Depression   . ED (erectile dysfunction)   . Heroin abuse   . Alcohol abuse   . Poor venous access     hx. of Heroin, Alcohol abuse "extreme difficult vein access"   Past Surgical History  Procedure Laterality Date  . Tonsillectomy    . Orbital fracture surgery    . Cholecystectomy    . I&d extremity  06/09/2012    Procedure: IRRIGATION AND DEBRIDEMENT EXTREMITY;  Surgeon: Sharma CovertFred W Ortmann, MD;  [facility];  Service: Orthopedics;  Laterality: Left;   Family History  Problem Relation Age of Onset  . Stroke Mother   . Heart disease Father    History  Substance Use Topics  .  Smoking status: Former Smoker    Types: Cigarettes    Quit date: 04/15/2012  . Smokeless tobacco: Never Used  . Alcohol Use: No     Comment: in rehab    Review of Systems  Musculoskeletal: Positive for arthralgias.  All other systems reviewed and are negative.     Allergies  Review of patient's allergies indicates no known allergies.  Home Medications   Prior to Admission medications   Medication Sig Start Date End Date Taking? Authorizing Provider  esomeprazole (NEXIUM) 20 MG capsule Take 1 capsule (20 mg total) by mouth daily at 12 noon. For acid reflux 10/09/13  Yes Sanjuana KavaAgnes I Nwoko, NP  Multiple Vitamin (MULTIVITAMIN WITH MINERALS) TABS tablet Take 1 tablet by mouth daily. For low vitamin Patient taking differently: Take 1 tablet by mouth daily. Centrum Silver-Take one daily 10/09/13  Yes Sanjuana KavaAgnes I Nwoko, NP  Naproxen Sodium (ALEVE PO) Take 220 mg by mouth as needed (headache).    Yes Historical Provider, MD  Probiotic Product (SOLUBLE FIBER/PROBIOTICS PO) Take 1 capsule by mouth daily.   Yes Historical Provider, MD  sildenafil (REVATIO) 20 MG tablet Take 20 mg by mouth daily as needed.  06/30/14  Yes Historical Provider, MD  traZODone (DESYREL) 150 MG tablet Take 1 tablet (150 mg total)  by mouth at bedtime. For sleep Patient taking differently: Take 150 mg by mouth at bedtime. Take 1/2 tab  for sleep 10/09/13  Yes Sanjuana Kava, NP  valACYclovir (VALTREX) 500 MG tablet Take 500 mg by mouth daily as needed.  05/10/14  Yes Historical Provider, MD  loratadine (CLARITIN) 10 MG tablet Take 1 tablet (10 mg total) by mouth daily as needed for allergies. Patient not taking: Reported on 08/03/2014 01/03/14   Candyce Churn III, MD  Na Sulfate-K Sulfate-Mg Sulf (SUPREP BOWEL PREP) Criss Rosales Suprep as directed / no substitutions Patient not taking: Reported on 08/03/2014 04/15/14   Louis Meckel, MD   BP 148/71 mmHg  Pulse 94  Temp(Src) 97.7 F (36.5 C) (Oral)  Resp 18  SpO2 97%   Physical  Exam  Constitutional: He is oriented to person, place, and time. He appears well-developed and well-nourished.  HENT:  Head: Normocephalic and atraumatic.  Mouth/Throat: Oropharynx is clear and moist.  Eyes: Conjunctivae and EOM are normal. Pupils are equal, round, and reactive to light.  Neck: Normal range of motion. Neck supple.  Cardiovascular: Normal rate, regular rhythm and normal heart sounds.   Pulmonary/Chest: Effort normal and breath sounds normal. No respiratory distress. He has no wheezes.  Abdominal: Soft. Bowel sounds are normal.  Musculoskeletal: Normal range of motion.  Chronic venous stasis of BLE with scarring from prior healed ulcers; no active ulcers Small scabs of bilateral shins with approx 2 inch surrounding erythema and warmth to touch; no signs of abscess formation or palpable fluid collection No calf asymmetry, tenderness, or palpable cords DP pulses and sensation intact throughout BLE  Neurological: He is alert and oriented to person, place, and time.  Skin: Skin is warm and dry.  Psychiatric: He has a normal mood and affect.  Nursing note and vitals reviewed.   ED Course  Procedures (including critical care time) Labs Review Labs Reviewed - No data to display  Imaging Review No results found.   EKG Interpretation None      MDM   Final diagnoses:  Cellulitis of both lower extremities   64 y.o. M with likely developing cellulitis of BLE secondary to scraped on bilateral shins.  Patient is currently afebrile and non-toxic in appearance.  No clinical signs of DVT or PE.  Erythema of BLE is fairly mild at this time, however he does have potential for worsening given his current Hep C status and hx of MRSA.  Will start on course of clindamycin and oxycodone.  Patient to FU with his PCP this week for-recheck.  Discussed plan with patient, he/she acknowledged understanding and agreed with plan of care.  Return precautions given for new or worsening  symptoms.  6:13 PM Received call from [facility], clindamycin written for patient currently on backorder and unavailable at this time.  Will switch to bactrim DS 1 tab BID and keflex  1 tab QID for 10 days.  Garlon Hatchet, PA-C 08/03/14 1947  Mirian Mo, MD 08/07/14 [phone]

## 2014-08-03 NOTE — Discharge Instructions (Signed)
Take the prescribed medication as directed. °Follow-up with your primary care physician. °Return to the ED for new or worsening symptoms. ° °

## 2014-10-21 ENCOUNTER — Ambulatory Visit: Payer: Self-pay | Admitting: Internal Medicine

## 2015-01-13 ENCOUNTER — Ambulatory Visit: Payer: Self-pay | Admitting: Internal Medicine

## 2015-01-13 ENCOUNTER — Ambulatory Visit (INDEPENDENT_AMBULATORY_CARE_PROVIDER_SITE_OTHER): Payer: BC Managed Care – PPO | Admitting: Internal Medicine

## 2015-01-13 ENCOUNTER — Encounter: Payer: Self-pay | Admitting: Internal Medicine

## 2015-01-13 VITALS — BP 131/75 | HR 101 | Temp 98.3°F | Ht 71.0 in | Wt 182.0 lb

## 2015-01-13 DIAGNOSIS — B182 Chronic viral hepatitis C: Secondary | ICD-10-CM

## 2015-01-13 NOTE — Assessment & Plan Note (Signed)
Cured with negative SVR24.   Will have him return in 1 year and repeat elastography to see if any progression of liver fibrosis.

## 2015-01-13 NOTE — Progress Notes (Signed)
   Subjective:    [name], male    DOB: [birth year], 64 y.o.   MRN: [id number]  HPI Here for hepatitis C follow up.  Genotype 1a, HCV RNA [id number].  Finished 12 weeks of Harvoni 6 months ago.  Continues to feel well and in fact more energy.    F2/F3.  Had SVR24 drawn by PCP and is undetectable.  Now considered cured. Pleased with results.       Review of Systems  Constitutional: Negative for fever and fatigue.  Gastrointestinal: Negative for nausea and diarrhea.  Skin: Negative for rash.  Neurological: Negative for dizziness and headaches.       Objective:   Physical Exam  Constitutional: He appears well-developed and well-nourished. No distress.  Eyes: No scleral icterus.  Cardiovascular: Normal rate, regular rhythm and normal heart sounds.   No murmur heard. Pulmonary/Chest: Effort normal and breath sounds normal. No respiratory distress. He has no wheezes.  Skin: No rash noted.          Assessment & Plan:

## 2015-02-21 ENCOUNTER — Emergency Department (INDEPENDENT_AMBULATORY_CARE_PROVIDER_SITE_OTHER)
Admission: EM | Admit: 2015-02-21 | Discharge: 2015-02-21 | Disposition: A | Discharge status: Home / Self Care | Payer: BC Managed Care – PPO | Source: Home / Self Care | Attending: Family Medicine | Admitting: Family Medicine

## 2015-02-21 ENCOUNTER — Encounter (HOSPITAL_COMMUNITY): Payer: Self-pay | Admitting: Emergency Medicine

## 2015-02-21 DIAGNOSIS — L03115 Cellulitis of right lower limb: Secondary | ICD-10-CM | POA: Diagnosis not present

## 2015-02-21 MED ORDER — CEPHALEXIN 500 MG PO CAPS: 500.0000 mg | ORAL_CAPSULE | Freq: Four times a day (QID) | ORAL | Status: DC

## 2015-02-21 NOTE — ED Notes (Signed)
Pt c/o recurrent cellulitis of bilateral lower lower extremities associated w/edema of of bilateral hands and legs Steady gait... Has been to [facility] ER for similar sx in the past... No PCP Alert and oriented x4... No acute disterss

## 2015-02-21 NOTE — Discharge Instructions (Signed)
See your doctor if further concerns °

## 2015-02-21 NOTE — ED Provider Notes (Addendum)
CSN: [id number]     Arrival date & time 02/21/15  1947 History   First MD Initiated Contact with Patient 02/21/15 2015     Chief Complaint  Patient presents with  . Recurrent Skin Infections   (Consider location/radiation/quality/duration/timing/severity/associated sxs/prior Treatment) Patient is a 63 y.o. male presenting with leg pain. The history is provided by the patient.  Leg Pain Location:  Leg Leg location:  R lower leg and L lower leg Pain details:    Quality:  Shooting and sharp   Progression:  Unchanged (pt is seeking pain med for chronic disorder of bilat lower legs. has no lmd, goes back and forth to [place]) Chronicity:  Chronic Dislocation: no   Relieved by:  None tried Worsened by:  Nothing tried   Past Medical History  Diagnosis Date  . Reflux   . Lower extremity venous stasis   . Genital herpes   . GERD (gastroesophageal reflux disease)   . Hep C w/o coma, chronic   . Anxiety   . Depression   . ED (erectile dysfunction)   . Heroin abuse   . Alcohol abuse   . Poor venous access     hx. of Heroin, Alcohol abuse "extreme difficult vein access"   Past Surgical History  Procedure Laterality Date  . Tonsillectomy    . Orbital fracture surgery    . Cholecystectomy    . I&d extremity  06/09/2012    Procedure: IRRIGATION AND DEBRIDEMENT EXTREMITY;  Surgeon: Sharma Covert, MD;  Location: [facility] OR;  Service: Orthopedics;  Laterality: Left;   Family History  Problem Relation Age of Onset  . Stroke Mother   . Heart disease Father    Social History  Substance Use Topics  . Smoking status: Former Smoker    Types: Cigarettes    Quit date: 04/15/2012  . Smokeless tobacco: Never Used  . Alcohol Use: No     Comment: in rehab    Review of Systems  Skin: Positive for color change and wound.  All other systems reviewed and are negative.   Allergies  Review of patient's allergies indicates no known allergies.  Home Medications   Prior to Admission  medications   Medication Sig Start Date End Date Taking? Authorizing Provider  esomeprazole (NEXIUM) 20 MG capsule Take 1 capsule (20 mg total) by mouth daily at 12 noon. For acid reflux 10/09/13  Yes Sanjuana Kava, NP  Probiotic Product (SOLUBLE FIBER/PROBIOTICS PO) Take 1 capsule by mouth daily.   Yes Historical Provider, MD  valACYclovir (VALTREX) 500 MG tablet Take 500 mg by mouth daily as needed.  05/10/14  Yes Historical Provider, MD  cephALEXin (KEFLEX) 500 MG capsule Take 1 capsule (500 mg total) by mouth 4 (four) times daily. Take all of medicine and drink lots of fluids 02/21/15   Linna Hoff, MD  loratadine (CLARITIN) 10 MG tablet Take 1 tablet (10 mg total) by mouth daily as needed for allergies. 01/03/14   Blake Divine, MD  Multiple Vitamin (MULTIVITAMIN WITH MINERALS) TABS tablet Take 1 tablet by mouth daily. For low vitamin Patient taking differently: Take 1 tablet by mouth daily. Centrum Silver-Take one daily 10/09/13   Sanjuana Kava, NP  Na Sulfate-K Sulfate-Mg Sulf (SUPREP BOWEL PREP) SOLN Suprep as directed / no substitutions Patient not taking: Reported on 08/03/2014 04/15/14   Louis Meckel, MD  Naproxen Sodium (ALEVE PO) Take 220 mg by mouth as needed (headache).     Historical Provider, MD  QUEtiapine (SEROQUEL) 100 MG tablet Take 100 mg by mouth at bedtime.    Historical Provider, MD  sildenafil (REVATIO) 20 MG tablet Take 20 mg by mouth daily as needed.  06/30/14   Historical Provider, MD   Meds Ordered and Administered this Visit  Medications - No data to display  BP 141/86 mmHg  Pulse 90  Temp(Src) 97.5 F (36.4 C) (Oral)  Resp 18  SpO2 95% No data found.   Physical Exam  Constitutional: He is oriented to person, place, and time. He appears well-developed and well-nourished. He appears distressed.  Neurological: He is alert and oriented to person, place, and time.  Skin: Skin is warm and dry. There is erythema.  staSIS DERMATITIS AND EDEMA OF BILAT LEGS, NO  ACUTE CHANGES, pt angry that not getting pain med.  Nursing note and vitals reviewed.   ED Course  Procedures (including critical care time)  Labs Review Labs Reviewed - No data to display  Imaging Review No results found.   Visual Acuity Review  Right Eye Distance:   Left Eye Distance:   Bilateral Distance:    Right Eye Near:   Left Eye Near:    Bilateral Near:         MDM   1. Cellulitis of right lower extremity    Pt given rx for keflex.   Linna Hoff, MD 02/21/15 2039  Linna Hoff, MD 02/27/15 2011

## 2015-03-16 ENCOUNTER — Emergency Department (HOSPITAL_COMMUNITY)
Admission: EM | Admit: 2015-03-16 | Discharge: 2015-03-16 | Disposition: A | Discharge status: Home / Self Care | Payer: BC Managed Care – PPO | Attending: Emergency Medicine | Admitting: Emergency Medicine

## 2015-03-16 ENCOUNTER — Encounter (HOSPITAL_COMMUNITY): Payer: Self-pay | Admitting: *Deleted

## 2015-03-16 DIAGNOSIS — N529 Male erectile dysfunction, unspecified: Secondary | ICD-10-CM | POA: Insufficient documentation

## 2015-03-16 DIAGNOSIS — Z8679 Personal history of other diseases of the circulatory system: Secondary | ICD-10-CM | POA: Diagnosis not present

## 2015-03-16 DIAGNOSIS — Z8619 Personal history of other infectious and parasitic diseases: Secondary | ICD-10-CM | POA: Insufficient documentation

## 2015-03-16 DIAGNOSIS — Z792 Long term (current) use of antibiotics: Secondary | ICD-10-CM | POA: Diagnosis not present

## 2015-03-16 DIAGNOSIS — Z87891 Personal history of nicotine dependence: Secondary | ICD-10-CM | POA: Diagnosis not present

## 2015-03-16 DIAGNOSIS — L03115 Cellulitis of right lower limb: Secondary | ICD-10-CM | POA: Diagnosis not present

## 2015-03-16 DIAGNOSIS — K219 Gastro-esophageal reflux disease without esophagitis: Secondary | ICD-10-CM | POA: Insufficient documentation

## 2015-03-16 DIAGNOSIS — M7989 Other specified soft tissue disorders: Secondary | ICD-10-CM

## 2015-03-16 DIAGNOSIS — Z79899 Other long term (current) drug therapy: Secondary | ICD-10-CM | POA: Insufficient documentation

## 2015-03-16 DIAGNOSIS — F329 Major depressive disorder, single episode, unspecified: Secondary | ICD-10-CM | POA: Diagnosis not present

## 2015-03-16 MED ORDER — CLINDAMYCIN HCL 150 MG PO CAPS: 450.0000 mg | ORAL_CAPSULE | Freq: Four times a day (QID) | ORAL | Status: DC

## 2015-03-16 MED ORDER — OXYCODONE-ACETAMINOPHEN 5-325 MG PO TABS
1.0000 | ORAL_TABLET | Freq: Once | ORAL | Status: AC
  Administered 2015-03-16: 1 via ORAL

## 2015-03-16 MED ORDER — OXYCODONE-ACETAMINOPHEN 5-325 MG PO TABS
ORAL_TABLET | ORAL | Status: AC
  Filled 2015-03-16: qty 1

## 2015-03-16 MED ORDER — ENOXAPARIN SODIUM 80 MG/0.8ML ~~LOC~~ SOLN
1.0000 mg/kg | Freq: Two times a day (BID) | SUBCUTANEOUS | Status: DC
  Administered 2015-03-16: 80 mg via SUBCUTANEOUS
  Filled 2015-03-16: qty 0.8

## 2015-03-16 NOTE — Discharge Instructions (Signed)
Come to the MAIN ENTRANCE (not the EMERGENCY DEPARTMENT) of [facility] TOMORROW, Tuesday, 10/4, after 9 AM. Ask for vascular lab. Let them know you are there for a DVT study that was ordered through the Emergency Department tonight. You will need to wait for the results.

## 2015-03-16 NOTE — ED Notes (Signed)
Pt reports hx of cellulitis. Having swelling to right lower leg x 1 week, redness began today. Denies fever.

## 2015-03-16 NOTE — ED Provider Notes (Signed)
CSN: [id number]     Arrival date & time 03/16/15  1553 History   First MD Initiated Contact with Patient 03/16/15 1839     Chief Complaint  Patient presents with  . Cellulitis  . Leg Swelling    The history is provided by the patient and medical records.    64 year old male with history of chronic venous stasis, recurrent cellulitis of lower legs, IVDU, hep C, opiate use disorder presenting with leg pain and swelling. Onset was 1 day ago. Located in right lower leg. Similar to prior episodes of cellulitis. Moderate severity. Worsening since onset. Describes as increasing pain, redness, dark discoloration of leg. Ineffective tx include antibiotics- patient reports earlier in the month he completed course of Keflex and then Augmentin for cellulitis in his legs. Reports this infection has cleared up and now is returned. Still using IV heroin. Last use yesterday. Denies fevers, chills, nausea/vomiting/diarrhea, leg numbness or weakness.    Past Medical History  Diagnosis Date  . Reflux   . Lower extremity venous stasis   . Genital herpes   . GERD (gastroesophageal reflux disease)   . Hep C w/o coma, chronic (HCC)   . Anxiety   . Depression   . ED (erectile dysfunction)   . Heroin abuse   . Alcohol abuse   . Poor venous access     hx. of Heroin, Alcohol abuse "extreme difficult vein access"   Past Surgical History  Procedure Laterality Date  . Tonsillectomy    . Orbital fracture surgery    . Cholecystectomy    . I&d extremity  06/09/2012    Procedure: IRRIGATION AND DEBRIDEMENT EXTREMITY;  Surgeon: Sharma Covert, MD;  Location: [facility] OR;  Service: Orthopedics;  Laterality: Left;   Family History  Problem Relation Age of Onset  . Stroke Mother   . Heart disease Father    Social History  Substance Use Topics  . Smoking status: Former Smoker    Types: Cigarettes    Quit date: 04/15/2012  . Smokeless tobacco: Never Used  . Alcohol Use: No     Comment: in rehab    Review of  Systems  Constitutional: Negative for fever.  HENT: Negative for rhinorrhea.   Eyes: Negative for visual disturbance.  Respiratory: Negative for shortness of breath.   Cardiovascular: Positive for leg swelling. Negative for chest pain.  Gastrointestinal: Negative for vomiting and abdominal pain.  Genitourinary: Negative for decreased urine volume.  Musculoskeletal: Positive for myalgias.  Skin: Positive for color change and rash.  Allergic/Immunologic: Negative for immunocompromised state.  Neurological: Negative for syncope.  Psychiatric/Behavioral: Negative for confusion.    Allergies  Review of patient's allergies indicates no known allergies.  Home Medications   Prior to Admission medications   Medication Sig Start Date End Date Taking? Authorizing Provider  cephALEXin (KEFLEX) 500 MG capsule Take 1 capsule (500 mg total) by mouth 4 (four) times daily. Take all of medicine and drink lots of fluids 02/21/15   Linna Hoff, MD  clindamycin (CLEOCIN) 150 MG capsule Take 3 capsules (450 mg total) by mouth every 6 (six) hours. 03/16/15   Urban Gibson, MD  esomeprazole (NEXIUM) 20 MG capsule Take 1 capsule (20 mg total) by mouth daily at 12 noon. For acid reflux 10/09/13   Sanjuana Kava, NP  loratadine (CLARITIN) 10 MG tablet Take 1 tablet (10 mg total) by mouth daily as needed for allergies. 01/03/14   Blake Divine, MD  Multiple Vitamin (MULTIVITAMIN WITH MINERALS) TABS  tablet Take 1 tablet by mouth daily. For low vitamin Patient taking differently: Take 1 tablet by mouth daily. Centrum Silver-Take one daily 10/09/13   Sanjuana Kava, NP  Na Sulfate-K Sulfate-Mg Sulf (SUPREP BOWEL PREP) SOLN Suprep as directed / no substitutions Patient not taking: Reported on 08/03/2014 04/15/14   Louis Meckel, MD  Naproxen Sodium (ALEVE PO) Take 220 mg by mouth as needed (headache).     Historical Provider, MD  Probiotic Product (SOLUBLE FIBER/PROBIOTICS PO) Take 1 capsule by mouth daily.    Historical  Provider, MD  QUEtiapine (SEROQUEL) 100 MG tablet Take 100 mg by mouth at bedtime.    Historical Provider, MD  sildenafil (REVATIO) 20 MG tablet Take 20 mg by mouth daily as needed.  06/30/14   Historical Provider, MD  valACYclovir (VALTREX) 500 MG tablet Take 500 mg by mouth daily as needed.  05/10/14   Historical Provider, MD   BP 138/74 mmHg  Pulse 85  Temp(Src) 98.3 F (36.8 C) (Oral)  Resp 16  Wt 179 lb (81.194 kg)  SpO2 100% Physical Exam  Constitutional: He is oriented to person, place, and time. He appears well-developed and well-nourished. No distress.  HENT:  Head: Normocephalic and atraumatic.  Eyes: Right eye exhibits no discharge. Left eye exhibits no discharge.  Neck: No tracheal deviation present.  Cardiovascular: Normal rate and regular rhythm.   Pulmonary/Chest: Effort normal and breath sounds normal. No respiratory distress.  Abdominal: Soft. He exhibits no distension. There is no tenderness.  Musculoskeletal: He exhibits no edema.  Edema in bilateral hands and lower extremities most prominent from knee down. Right leg edematous to mid thigh, worse than left. Chronic venous stasis changes. Overlying dark erythematous discoloration and increased warmth with small area of serous drainage, no clear focal area of induration or fluctuance. 2+ DP pulse. Sensation intact to light touch. Strength 5/5 at knee, ankle, great toe. TTP calf squeeze, indeterminate homans sign  Neurological: He is alert and oriented to person, place, and time.  Skin: Skin is warm and dry.  Psychiatric: He has a normal mood and affect. His behavior is normal.    ED Course  Procedures (including critical care time) Labs Review Labs Reviewed - No data to display  Imaging Review No results found. I have personally reviewed and evaluated these images and lab results as part of my medical decision-making.   EKG Interpretation None      MDM   Final diagnoses:  Cellulitis of right lower  extremity  Right leg swelling    64 year old male with history of chronic venous stasis, recurrent cellulitis of lower legs, IVDU, hep C, opiate use disorder presenting with leg pain and swelling. Presentation concerning for recurrent cellulitis. Do not feel diffusely outpatient treatment failure as symptoms had resolved for over a week after last course of antibiotics and now have recurred in setting of ongoing IVDU. Patient stable, afebrile and well-appearing. The cellulitis is likely, feel patient should be evaluated for possible DVT as well given asymmetric edema and tenderness to palpation over deep veins in this extremity. Given one therapeutic dose lovenox, ordered DVT study as outpatient tomorrow. Sent home with Rx for clindamycin and instructions to present tomorrow morning for DVT study. Pt verbalized understanding. Dc in stable condition.   Case discussed with Dr. Clayborne Dana who oversaw management of this patient.    Urban Gibson, MD 03/17/15 1525  Marily Memos, MD 03/18/15 [phone]

## 2015-03-16 NOTE — Progress Notes (Signed)
ANTICOAGULATION CONSULT NOTE - Initial Consult  Pharmacy Consult for Lovenox Indication: DVT  No Known Allergies  Patient Measurements: Weight: 179 lb (81.194 kg) (Pt reported)  Vital Signs: Temp: 98.3 F (36.8 C) (10/03 1609) Temp Source: Oral (10/03 1609) BP: 151/78 mmHg (10/03 2030) Pulse Rate: 86 (10/03 2032)  Labs: No results for input(s): HGB, HCT, PLT, APTT, LABPROT, INR, HEPARINUNFRC, CREATININE, CKTOTAL, CKMB, TROPONINI in the last 72 hours.  CrCl cannot be calculated (Patient has no serum creatinine result on file.).   Medical History: Past Medical History  Diagnosis Date  . Reflux   . Lower extremity venous stasis   . Genital herpes   . GERD (gastroesophageal reflux disease)   . Hep C w/o coma, chronic (HCC)   . Anxiety   . Depression   . ED (erectile dysfunction)   . Heroin abuse   . Alcohol abuse   . Poor venous access     hx. of Heroin, Alcohol abuse "extreme difficult vein access"    Medications:   (Not in a hospital admission)  Assessment: 64 yo M being initiated on empiric Lovenox for DVT treatment, to have VQ scan done tomorrow to confirm DVT.   Goal of Therapy:  Monitor platelets by anticoagulation protocol: Yes   Plan:  Lovenox 80 mg q12h  F/U VQ scan, long term AC plans CBC q72h, BMET tomorrow AM  Arcola Jansky, PharmD Clinical Pharmacy Resident Pager: [phone] 03/16/2015,9:05 PM

## 2015-03-18 ENCOUNTER — Ambulatory Visit (HOSPITAL_COMMUNITY)
Admission: RE | Admit: 2015-03-18 | Discharge: 2015-03-18 | Disposition: A | Discharge status: Home / Self Care | Payer: BC Managed Care – PPO | Source: Ambulatory Visit | Attending: Internal Medicine | Admitting: Internal Medicine

## 2015-03-18 DIAGNOSIS — M7989 Other specified soft tissue disorders: Secondary | ICD-10-CM | POA: Insufficient documentation

## 2015-03-18 NOTE — Progress Notes (Signed)
VASCULAR LAB PRELIMINARY  PRELIMINARY  PRELIMINARY  PRELIMINARY  Bilateral lower extremity venous duplex  completed.    Preliminary report:  Bilateral:  No evidence of DVT, superficial thrombosis, or Baker's Cyst.    Romon Devereux, RVT 03/18/2015, 10:48 AM

## 2015-04-06 ENCOUNTER — Other Ambulatory Visit: Payer: Self-pay | Admitting: Cardiology

## 2015-04-06 DIAGNOSIS — I872 Venous insufficiency (chronic) (peripheral): Secondary | ICD-10-CM

## 2015-04-06 DIAGNOSIS — L03115 Cellulitis of right lower limb: Secondary | ICD-10-CM

## 2015-04-06 DIAGNOSIS — L03116 Cellulitis of left lower limb: Secondary | ICD-10-CM

## 2015-04-16 ENCOUNTER — Ambulatory Visit
Admission: RE | Admit: 2015-04-16 | Discharge: 2015-04-16 | Disposition: A | Discharge status: Home / Self Care | Payer: BC Managed Care – PPO | Source: Ambulatory Visit | Attending: Cardiology | Admitting: Cardiology

## 2015-04-16 ENCOUNTER — Other Ambulatory Visit: Payer: Self-pay

## 2015-04-16 DIAGNOSIS — L03116 Cellulitis of left lower limb: Secondary | ICD-10-CM

## 2015-04-16 DIAGNOSIS — L03115 Cellulitis of right lower limb: Secondary | ICD-10-CM

## 2015-04-16 DIAGNOSIS — I872 Venous insufficiency (chronic) (peripheral): Secondary | ICD-10-CM

## 2015-04-16 NOTE — Progress Notes (Signed)
Dr. Fredia SorrowYamagata in to speak with patient after US to discuss diagnosis, 668-month plan for conservative care and vein ablation treatment.  jkl

## 2015-04-25 DIAGNOSIS — L03116 Cellulitis of left lower limb: Secondary | ICD-10-CM

## 2015-04-25 DIAGNOSIS — R600 Localized edema: Secondary | ICD-10-CM | POA: Insufficient documentation

## 2015-04-25 DIAGNOSIS — I872 Venous insufficiency (chronic) (peripheral): Secondary | ICD-10-CM | POA: Insufficient documentation

## 2015-04-25 DIAGNOSIS — L03115 Cellulitis of right lower limb: Secondary | ICD-10-CM | POA: Insufficient documentation

## 2015-04-25 DIAGNOSIS — L97909 Non-pressure chronic ulcer of unspecified part of unspecified lower leg with unspecified severity: Secondary | ICD-10-CM

## 2015-04-25 DIAGNOSIS — I83009 Varicose veins of unspecified lower extremity with ulcer of unspecified site: Secondary | ICD-10-CM | POA: Insufficient documentation

## 2015-04-25 NOTE — Consult Note (Signed)
Chief Complaint: Bilateral lower extremity edema and chronic venous stasis. Patient was seen in consultation today at the request of Ganji,Jay  Referring Physician(s): Ganji,Jay  History of Present Illness: Bryan Powell is a 64 y.o. male with a chronic history of bilateral lower extremity edema and episodes of recurrent lower extremity cellulitis. Chronic pain and edema is present at all times in both legs, especially below the knees. He has had some prior ulcerations and states that chronic skin changes have worsened. Prior knee-high compression stockings have been previously prescribed but the patient does not wear them regularly as the top of the compression garments bother his knee regions and cause pain. He has not had any prior varicose vein treatments. He uses tramadol for pain which does not help. He does elevate his legs which causes some improvement in edema.  Past Medical History  Diagnosis Date  . Reflux   . Lower extremity venous stasis   . Genital herpes   . GERD (gastroesophageal reflux disease)   . Hep C w/o coma, chronic (HCC)   . Anxiety   . Depression   . ED (erectile dysfunction)   . Heroin abuse   . Alcohol abuse   . Poor venous access     hx. of Heroin, Alcohol abuse "extreme difficult vein access"    Past Surgical History  Procedure Laterality Date  . Tonsillectomy    . Orbital fracture surgery    . Cholecystectomy    . I&d extremity  06/09/2012    Procedure: IRRIGATION AND DEBRIDEMENT EXTREMITY;  Surgeon: Sharma Covert, MD;  Location: [facility] OR;  Service: Orthopedics;  Laterality: Left;    Allergies: Review of patient's allergies indicates no known allergies.  Medications: Prior to Admission medications   Medication Sig Start Date End Date Taking? Authorizing Provider  esomeprazole (NEXIUM) 20 MG capsule Take 1 capsule (20 mg total) by mouth daily at 12 noon. For acid reflux 10/09/13  Yes Sanjuana Kava, NP  loratadine (CLARITIN) 10 MG  tablet Take 1 tablet (10 mg total) by mouth daily as needed for allergies. 01/03/14  Yes Blake Divine, MD  Multiple Vitamin (MULTIVITAMIN WITH MINERALS) TABS tablet Take 1 tablet by mouth daily. For low vitamin Patient taking differently: Take 1 tablet by mouth daily. Centrum Silver-Take one daily 10/09/13  Yes Sanjuana Kava, NP  Naproxen Sodium (ALEVE PO) Take 220 mg by mouth as needed (headache).    Yes Historical Provider, MD  Probiotic Product (SOLUBLE FIBER/PROBIOTICS PO) Take 1 capsule by mouth daily.   Yes Historical Provider, MD  QUEtiapine (SEROQUEL) 100 MG tablet Take 100 mg by mouth at bedtime.   Yes Historical Provider, MD  sildenafil (REVATIO) 20 MG tablet Take 20 mg by mouth daily as needed.  06/30/14  Yes Historical Provider, MD  traMADol Janean Sark) 50 MG tablet  03/12/15  Yes Historical Provider, MD  valACYclovir (VALTREX) 500 MG tablet Take 500 mg by mouth daily as needed.  05/10/14  Yes Historical Provider, MD     Family History  Problem Relation Age of Onset  . Stroke Mother   . Heart disease Father     Social History   Social History  . Marital Status: Married    Spouse Name: N/A  . Number of Children: N/A  . Years of Education: N/A   Social History Main Topics  . Smoking status: Former Smoker    Types: Cigarettes    Quit date: 04/15/2012  . Smokeless tobacco:  Never Used  . Alcohol Use: No     Comment: in rehab  . 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 Sexual Activity: Not on file   Other Topics Concern  . Not on file   Social History Narrative    Review of Systems: A 12 point ROS discussed and pertinent positives are indicated in the HPI above.  All other systems are negative.  Review of Systems  Constitutional: Negative.   Respiratory: Negative.   Cardiovascular: Negative.   Gastrointestinal: Negative.   Genitourinary: Negative.   Musculoskeletal:       Bilateral lower extremity edema, discoloration and pain.  Skin: Positive for  color change and wound.  Neurological: Negative.     Vital Signs: BP 132/80 mmHg  Pulse 103  Temp(Src) 97.6 F (36.4 C)  Resp 18  SpO2 100%  Physical Exam  Constitutional: He is oriented to person, place, and time. No distress.  Cardiovascular: Normal rate, regular rhythm and normal heart sounds.  Exam reveals no gallop and no friction rub.   No murmur heard. Pulmonary/Chest: Effort normal and breath sounds normal. No respiratory distress. He has no wheezes. He has no rales.  Musculoskeletal: He exhibits edema.  Right lower extremity:  Tense edema below the knees with palpable varicosities and hyperpigmentation.  Healed pretibial ulcer.  Skin positive for lipodermatosclerosis.  Left lower extremity:  Similar exam.T ense edema below the knees with palpable varicosities and hyperpigmentation.  Healed pretibial ulcer.  Skin positive for lipodermatosclerosis.     Neurological: He is alert and oriented to person, place, and time.  Skin: He is not diaphoretic.  Nursing note and vitals reviewed.    Imaging: US Venous Img Lower Bilateral  04/17/2015  CLINICAL DATA:  Chronic symptomatic edema of both lower extremities with history of prior ulcers and chronic lower leg skin changes. EXAM: BILATERAL LOWER EXTREMITY VENOUS DUPLEX ULTRASOUND TECHNIQUE: Gray-scale sonography with graded compression, as well as color Doppler and duplex ultrasound, were performed to evaluate the deep and superficial veins of both lower extremities. Spectral Doppler was utilized to evaluate flow at rest and with distal augmentation maneuvers. A complete superficial venous insufficiency exam was performed in the upright standing position. I personally performed the technical portion of the exam. COMPARISON:  None. FINDINGS: RIGHT LOWER EXTREMITY: Deep veins: Normally patent deep venous system of the right lower extremity without evidence of acute thrombus, chronic thrombus or deep venous reflux. Normal deep venous  anatomy. Superficial veins: The saphenofemoral junction demonstrates reflux with augmentation lasting approximately 0.4 seconds. Immediately beyond the saphenofemoral junction, the great saphenous vein (GSV) in the proximal thigh is enlarged, measuring 8 mm in diameter. The proximal thigh segment of the GSV demonstrates severe incompetence with reflux lasting 3 seconds after augmentation. In the mid thigh, GSV reflux lasts for 4 seconds with augmentation. In the distal thigh, GSV reflux lasts for greater than 5 seconds with augmentation. At the knee, GSV reflux lasts for 3.5 seconds after augmentation. There are multiple varicosities communicating with the great saphenous vein in the proximal and mid calf and multiple incompetent perforator veins also identified in the mid and distal calf. In the proximal calf, GSV reflux last for 3.6 seconds with augmentation. In the mid calf, GSV reflux less for 1.9 seconds with augmentation. In the distal calf, GSV reflux lasts for 3 seconds with augmentation. Sampled incompetent perforator vein in the mid calf demonstrates reflux lasting for 1.5 seconds. The short saphenous  vein (SSV) is enlarged measuring 6 mm in the proximal calf. The short saphenous vein at the level of the knee demonstrates no reflux. A true saphenopopliteal junction was not able to be identified. Trace reflux is identified in the proximal calf segment of the SSV. Reflux lasting approximately 0.5 seconds was identified in the mid calf. The distal calf segment of the short saphenous vein demonstrates significant incompetence with reflux lasting 3.3 seconds with augmentation. There are some communicating varicosities in the mid to distal calf. LEFT LOWER EXTREMITY: Deep veins: Normally patent deep venous system of the right lower extremity without evidence of acute thrombus, chronic thrombus or deep venous reflux. Normal deep venous anatomy. The left saphenofemoral junction demonstrates severe incompetence  with reflux lasting greater than 5.5 seconds with augmentation. The GSV measures 8 mm in the proximal thigh. The proximal thigh segment of the GSV demonstrates reflux lasting 5.8 seconds. The mid thigh segment demonstrates reflux lasting 5.7 seconds. The distal thigh segment demonstrates reflux lasting 5.6 seconds. The knee segment of the GSV demonstrates reflux lasting 1 second. The proximal calf, mid calf and distal calf segments demonstrate reflux lasting approximately 1 second. There is an incompetent perforator vein communicating with the GSV in the mid calf. Multiple varicosities communicate with the GSV in the calf. The left short saphenous vein is enlarged at the knee, measuring 5 mm. The knee segment demonstrates reflux lasting approximately 1 second. Proximal calf and mid calf SSV segments also demonstrate reflux lasting approximately 1 second with communicating varicosities in the mid calf. The distal calf segment of the SSV shows more significant incompetence with reflux lasting 2.4 seconds with augmentation. Visualize superficial veins and communicating varicosities in both lower extremities demonstrate no evidence of thrombus. IMPRESSION: Severe venous insufficiency noted involving the great saphenous veins and short saphenous veins of both lower extremities, as detailed above. Extensive reflux is identified with augmentation and multiple communicating varicosities are identified throughout both calves communicating with primarily the GSV but also the SSV bilaterally. Component of incompetent perforator veins are also identified bilaterally communicating in the calves with the great saphenous veins. Electronically Signed   By: Irish Lack M.D.   On: 04/17/2015 09:36    Labs:  CBC: No results for input(s): WBC, HGB, HCT, PLT in the last 8760 hours.  COAGS: No results for input(s): INR, APTT in the last 8760 hours.  BMP:  Recent Labs  05/19/14 1728 07/17/14 1708  NA 134* 135  K 4.0  4.1  CL 103 102  CO2 23 25  GLUCOSE 105* 93  BUN 16 17  CALCIUM 9.1 9.1  CREATININE 0.78 0.67  GFRNONAA >89 >89  GFRAA >89 >89    LIVER FUNCTION TESTS:  Recent Labs  05/19/14 1728 07/17/14 1708  BILITOT 0.8 0.7  AST 44* 37  ALT 91* 99*  ALKPHOS 55 62  PROT 6.8 7.0  ALBUMIN 3.9 4.0    TUMOR MARKERS: No results for input(s): AFPTM, CEA, CA199, CHROMGRNA in the last 8760 hours.  Assessment and Plan:  Clinical and sonographic assessment today demonstrates severe bilateral superficial venous insufficiency. Insufficiency is most prominent at the level of both great saphenous veins which communicate with calf varicosities and incompetent perforator veins. Less severe insufficiency is identified at the level of both short saphenous veins. Both great saphenous veins are enlarged, measuring 8 mm in diameter in the thighs. Clinical examination shows evidence of tight edema of both lower legs with healed venous ulcers and bilateral lipodermatosclerosis.  CEAP classification  of venous disease in both legs is C5,s, Ep, As,p, Pr.  [name] was given information and prescription to be fitted for bilateral thigh-high compression garments rated at 20-30 mmHg compression. I have recommended that he obtain these garments and begin regular use of prescription compression garments in both legs during waking hours. I will see him back in 3 months after a three-month trial of conservative therapy. Should symptoms not be significantly improved, he would benefit from endovenous laser occlusion of both great saphenous veins with potential need for additional sclerotherapy of varicosities. Bilateral short saphenous veins ultimately may also benefit from endovenous laser occlusion. Overall symptoms are slightly worse in the right leg and the right leg could be treated first.  Thank you for this interesting consult.  I greatly enjoyed meeting Bryan Powell and look forward to participating in their  care.  A copy of this report was sent to the requesting provider on this date.  SignedIrish Lack: Rogen Porte T 04/25/2015, 12:07 PM   I spent a total of 40 Minutes in face to face in clinical consultation, greater than 50% of which was counseling/coordinating care for bilateral lower extremity venous insufficiency.

## 2015-05-15 DIAGNOSIS — Z23 Encounter for immunization: Secondary | ICD-10-CM | POA: Diagnosis not present

## 2015-05-28 DIAGNOSIS — I872 Venous insufficiency (chronic) (peripheral): Secondary | ICD-10-CM | POA: Diagnosis not present

## 2015-05-28 DIAGNOSIS — Q211 Atrial septal defect: Secondary | ICD-10-CM | POA: Diagnosis not present

## 2015-05-28 DIAGNOSIS — R0602 Shortness of breath: Secondary | ICD-10-CM | POA: Diagnosis not present

## 2015-05-28 DIAGNOSIS — F17200 Nicotine dependence, unspecified, uncomplicated: Secondary | ICD-10-CM | POA: Diagnosis not present

## 2015-07-02 IMAGING — CT CT PELVIS W/ CM
2 of 3 series · 16 of 46 positions shown, 18 images · IV contrast (omnipaque)
Comparison: US ABDOMEN COMPLETE dated 08/11/2011

CLINICAL DATA: Cellulitis, possible bilateral buttock abscess.

EXAM:
CT PELVIS WITH CONTRAST
TECHNIQUE: Multidetector CT imaging of the pelvis was performed using the
standard protocol following the bolus administration of intravenous
contrast.
CONTRAST:  100mL OMNIPAQUE IOHEXOL 300 MG/ML  SOLN

[Series 2: pelvis 5.0 i31f 3 · axial · 0.90mm/px · z∈[-563,-293]mm · 13 of 63 slices shown, 15 images]
[im 5/63  soft-tissue]
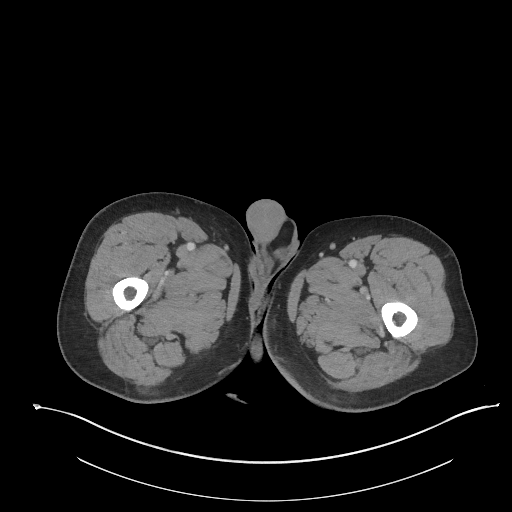
[im 5/63  bone]
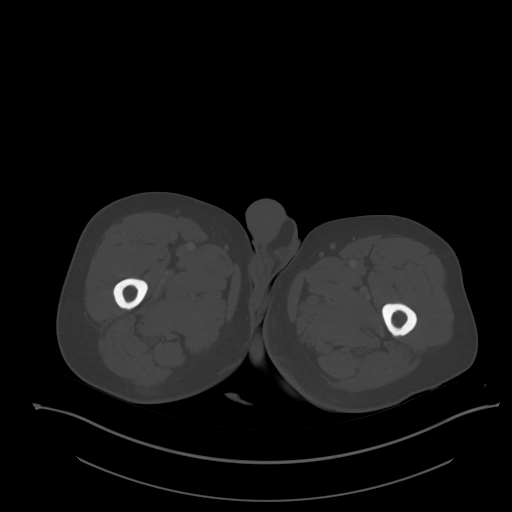
[im 9/63  soft-tissue]
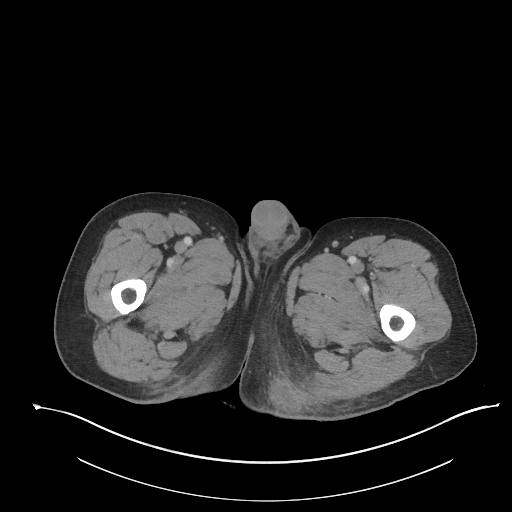
[im 13/63  soft-tissue]
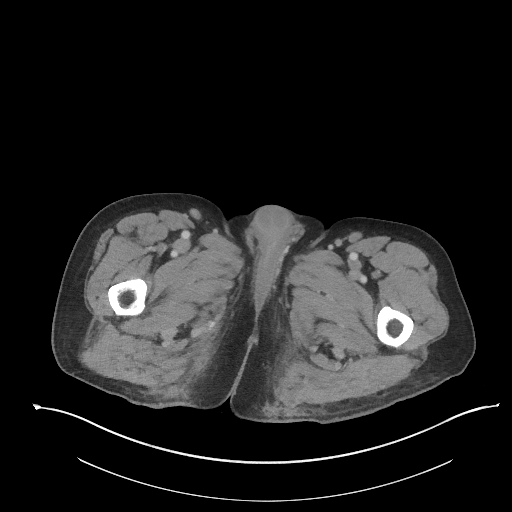
[im 19/63  soft-tissue]
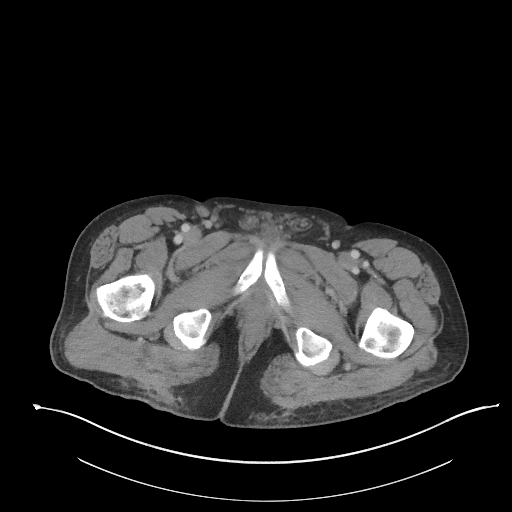
[im 23/63  soft-tissue]
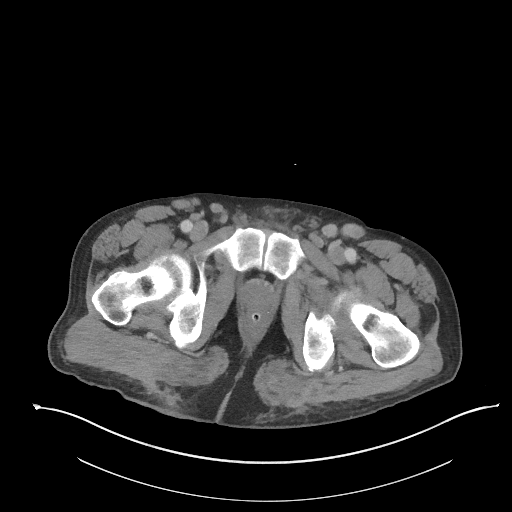
[im 27/63  soft-tissue]
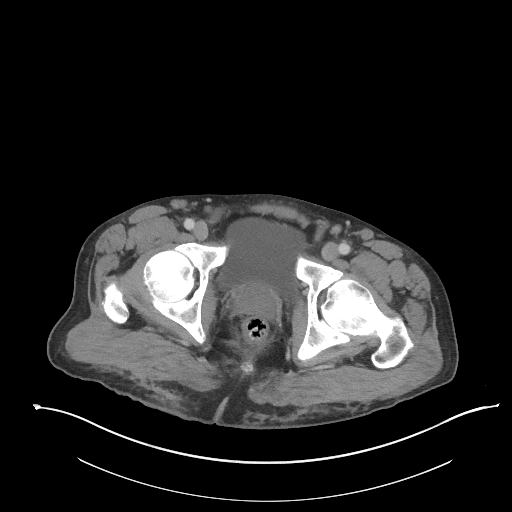
[im 33/63  soft-tissue]
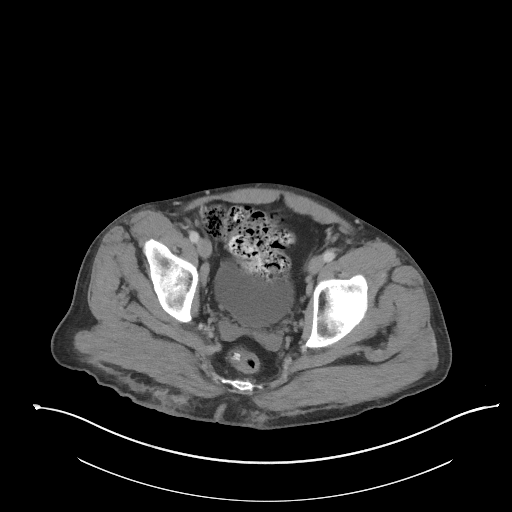
[im 37/63  soft-tissue]
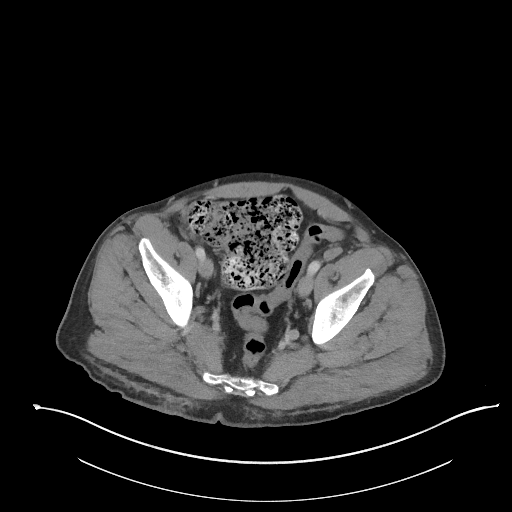
[im 41/63  soft-tissue]
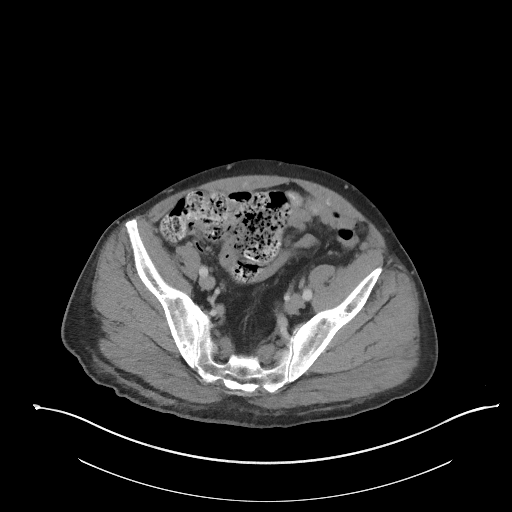
[im 41/63  bone]
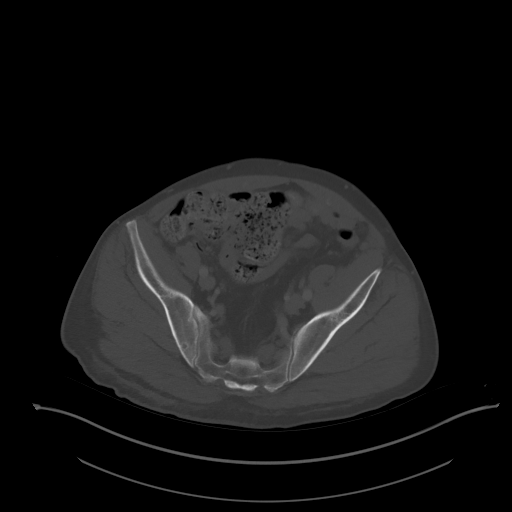
[im 45/63  soft-tissue]
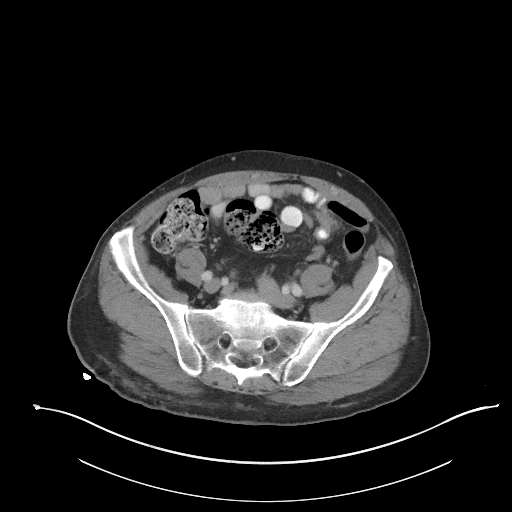
[im 51/63  soft-tissue]
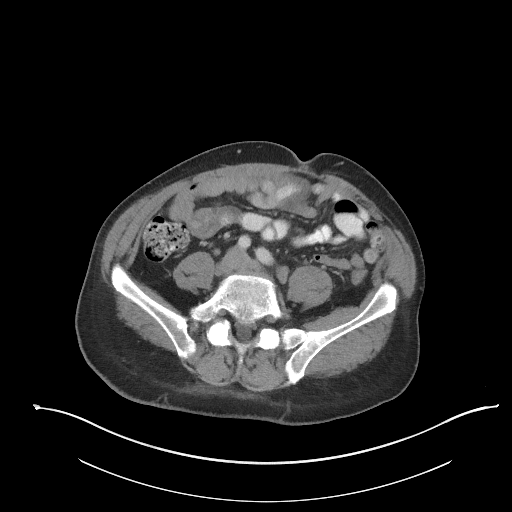
[im 55/63  soft-tissue]
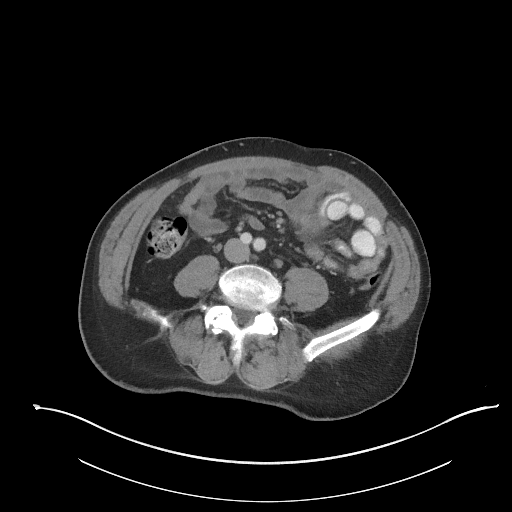
[im 59/63  soft-tissue]
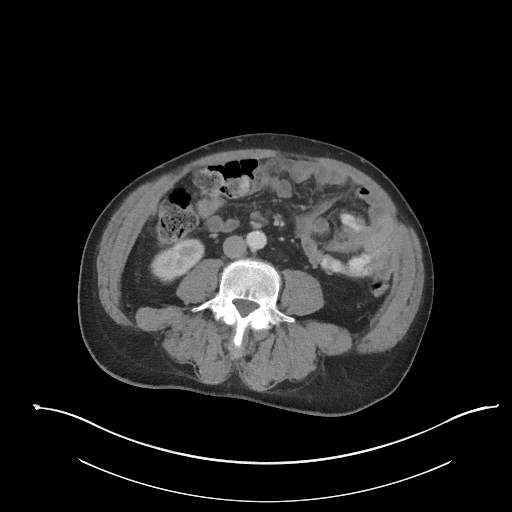

[Series 7: coronal · coronal · 0.61mm/px · 3 of 78 slices shown]
[im 26/78  soft-tissue]
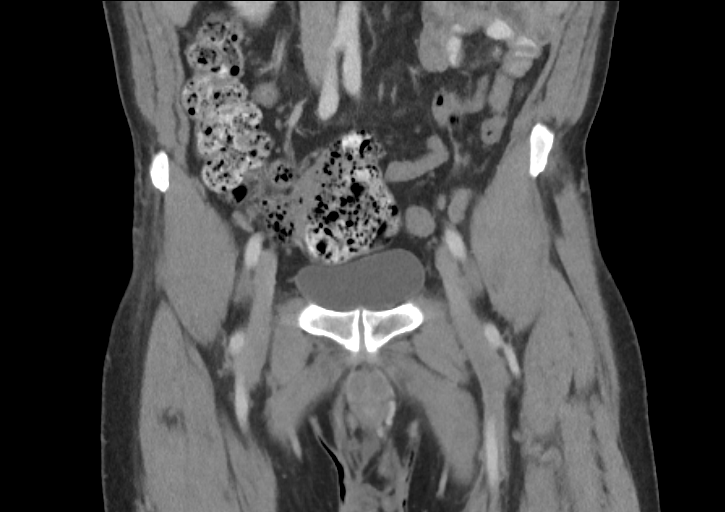
[im 35/78  soft-tissue]
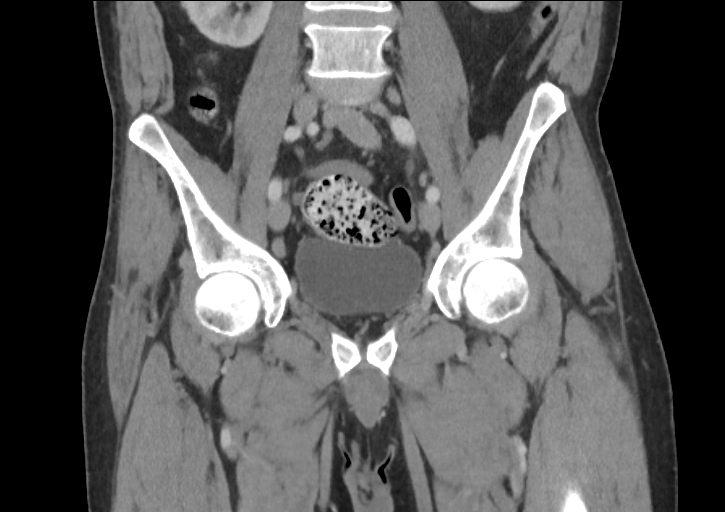
[im 43/78  soft-tissue]
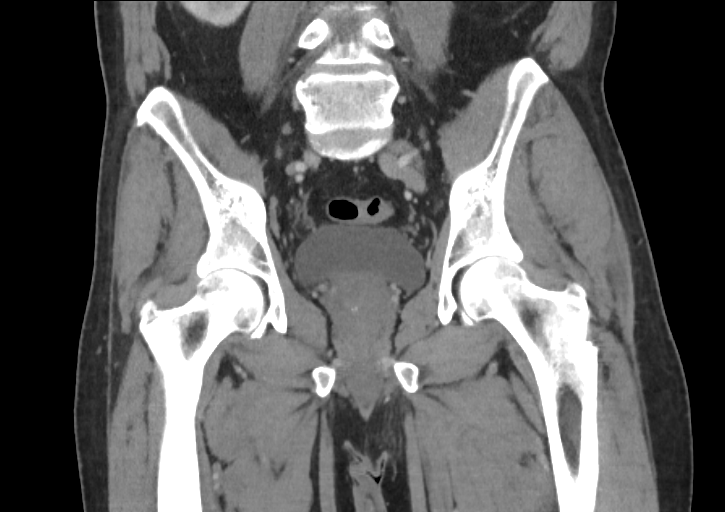

[16 of 46 positions shown; findings below may reference images not displayed]

FINDINGS: Bilateral gluteal subcutaneous fat stranding, without subcutaneous
gas, radiopaque foreign bodies, free fluid or rim enhancing fluid
collections. Pelvic musculature is unremarkable, no CT findings of
myositis.

Included view of the abdomen demonstrates a moderate amount of
retained fecal stool. A few sigmoid diverticula. No intraperitoneal
free fluid nor free air. Urinary bladder is well distended and
unremarkable. Prostate is not enlarged.

No destructive bony lesions. Transitional anatomy, partially
sacralized L5 vertebral body with severe lower lumbar facet
arthropathy.
IMPRESSION: Bilateral gluteal subcutaneous fat stranding may reflect cellulitis
without abscess or subcutaneous gas.

  By: Lalehan Et

## 2015-07-22 ENCOUNTER — Other Ambulatory Visit (HOSPITAL_COMMUNITY): Payer: Self-pay | Admitting: Interventional Radiology

## 2015-07-22 DIAGNOSIS — I872 Venous insufficiency (chronic) (peripheral): Secondary | ICD-10-CM

## 2015-07-22 DIAGNOSIS — L03116 Cellulitis of left lower limb: Secondary | ICD-10-CM

## 2015-07-22 DIAGNOSIS — L03115 Cellulitis of right lower limb: Secondary | ICD-10-CM

## 2015-08-27 ENCOUNTER — Ambulatory Visit
Admission: RE | Admit: 2015-08-27 | Discharge: 2015-08-27 | Disposition: A | Discharge status: Home / Self Care | Payer: Medicare Other | Source: Ambulatory Visit | Attending: Interventional Radiology | Admitting: Interventional Radiology

## 2015-08-27 DIAGNOSIS — I872 Venous insufficiency (chronic) (peripheral): Secondary | ICD-10-CM | POA: Diagnosis not present

## 2015-08-27 DIAGNOSIS — L03116 Cellulitis of left lower limb: Secondary | ICD-10-CM

## 2015-08-27 DIAGNOSIS — L03115 Cellulitis of right lower limb: Secondary | ICD-10-CM

## 2015-08-27 NOTE — Progress Notes (Signed)
Chief Complaint  Patient presents with  . Follow-up    3 mo follow up Conservative Rx of Varicose Veins     History of Present Illness: Bryan Powell is a 65 y.o. male initially seen on 04/16/2015 after referral from Dr. Yates Decamp for evaluation of symptomatic lower extremity edema and bilateral venous insufficiency.  At that time his primary complaint was chronic significant edema and pain of both lower extremities with recurrent episodes of lower extremity cellulitis and prior venous ulcerations. Evaluation at that time demonstrated severe bilateral superficial venous insufficiency, most prominently at the level of both great saphenous veins which communicate with calf varicosities and incompetent perforator veins. Less severe insufficiency was identified in bilateral short saphenous veins. Clinical examination demonstrated tense edema of both lower legs with healed venous ulcers and bilateral lipodermatosclerosis.  Bilateral CEAP classification was C5,s, Ep, As,p, Pr.  As the patient was not regularly using compression garments, he was fitted for bilateral thigh-high compression garments rated at 20-30 mm Hg compression. He did obtain those immediately following prior consultation and has been wearing them during waking hours every day for the last 4 months. He does notice that edema at the end of the day is not as severe with regular use of the compression garments. He still is having significant chronic pain in both lower extremities which has not been significantly relieved with compression garments. He has not developed any interval ulcers.  Past Medical History  Diagnosis Date  . Reflux   . Lower extremity venous stasis   . Genital herpes   . GERD (gastroesophageal reflux disease)   . Hep C w/o coma, chronic (HCC)   . Anxiety   . Depression   . ED (erectile dysfunction)   . Heroin abuse   . Alcohol abuse   . Poor venous access     hx. of Heroin, Alcohol abuse "extreme  difficult vein access"    Past Surgical History  Procedure Laterality Date  . Tonsillectomy    . Orbital fracture surgery    . Cholecystectomy    . I&d extremity  06/09/2012    Procedure: IRRIGATION AND DEBRIDEMENT EXTREMITY;  Surgeon: Sharma Covert, MD;  Location: [facility] OR;  Service: Orthopedics;  Laterality: Left;    Allergies: Review of patient's allergies indicates no known allergies.  Medications: Prior to Admission medications   Medication Sig Start Date End Date Taking? Authorizing Provider  esomeprazole (NEXIUM) 20 MG capsule Take 1 capsule (20 mg total) by mouth daily at 12 noon. For acid reflux 10/09/13  Yes Sanjuana Kava, NP  loratadine (CLARITIN) 10 MG tablet Take 1 tablet (10 mg total) by mouth daily as needed for allergies. 01/03/14  Yes Blake Divine, MD  Multiple Vitamin (MULTIVITAMIN WITH MINERALS) TABS tablet Take 1 tablet by mouth daily. For low vitamin Patient taking differently: Take 1 tablet by mouth daily. Centrum Silver-Take one daily 10/09/13  Yes Sanjuana Kava, NP  Naproxen Sodium (ALEVE PO) Take 220 mg by mouth as needed (headache).    Yes Historical Provider, MD  Probiotic Product (SOLUBLE FIBER/PROBIOTICS PO) Take 1 capsule by mouth daily.   Yes Historical Provider, MD  QUEtiapine (SEROQUEL) 100 MG tablet Take 100 mg by mouth at bedtime.   Yes Historical Provider, MD  sildenafil (REVATIO) 20 MG tablet Take 20 mg by mouth daily as needed.  06/30/14  Yes Historical Provider, MD  traMADol Janean Sark) 50 MG tablet  03/12/15  Yes Historical Provider, MD  valACYclovir (VALTREX) 500  MG tablet Take 500 mg by mouth daily as needed.  05/10/14  Yes Historical Provider, MD     Family History  Problem Relation Age of Onset  . Stroke Mother   . Heart disease Father     Social History   Social History  . Marital Status: Married    Spouse Name: N/A  . Number of Children: N/A  . Years of Education: N/A   Social History Main Topics  . Smoking status: Former Smoker     Types: Cigarettes    Quit date: 04/15/2012  . Smokeless tobacco: Never Used  . Alcohol Use: No     Comment: in rehab  . 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 Sexual Activity: Not on file   Other Topics Concern  . Not on file   Social History Narrative     Review of Systems: A 12 point ROS discussed and pertinent positives are indicated in the HPI above.  All other systems are negative.  Review of Systems  Constitutional: Negative.   Respiratory: Negative.   Cardiovascular: Negative.   Gastrointestinal: Negative.   Genitourinary: Negative.   Musculoskeletal:       Bilateral lower extremity edema and pain.  Neurological: Negative.      Vital Signs: BP 142/85 mmHg  Pulse 76  Temp(Src) 98.2 F (36.8 C) (Oral)  Resp 15  Ht 5\' 11"  (1.803 m)  Wt 185 lb (83.915 kg)  BMI 25.81 kg/m2  SpO2 97%  Physical Exam  Musculoskeletal:  No significant change in clinical exam of both lower extremities.  Significant lower extremity edema remains, especially below the knees.  Chronic hyperpigmentation, lipodermatosclerosis and healed ulcers appear stable bilaterally.  Vitals reviewed.    Assessment and Plan:  Mr. Bryan Powell feels that edema is slightly improved with regular use of thigh-high compression garments. However, pain is not significantly improved and he has failed conservative treatment. Based on previous superficial venous insufficiency ultrasound examination documented previously, there would be an indication to treat both great saphenous veins with endovenous laser occlusion. Some of the varicosities and incompetent perforator veins could also be injected at that time with sclerosant. The short saphenous veins did not demonstrate as significant reflux and do not have to be treated immediately.  Laser occlusion of the great saphenous veins was discussed with the patient again today including risks and potential benefits after treatment. The patient would like  to schedule treatment of both legs. We will begin the authorization process. Bilateral venous treatments can be separated by one month to allow adequate recovery time.  The patient is currently out of pain medications and previously was prescribed tramadol for pain. He requested some additional medication for bilateral lower extremity pain today. He is scheduled to see Dr. Thea SilversmithMackenzie at the end of the month. A prescription for tramadol 50 mg, #50, up to every 8 hours was written and given to the patient today.  He will consult with Dr. Thea SilversmithMackenzie regarding any other additional pain medication needs.  Electronically SignedIrish Lack: Garrick Midgley T 08/27/2015, 4:26 PM   I spent a total of 15 Minutes in face to face in clinical consultation, greater than 50% of which was counseling/coordinating care for bilateral venous insufficiency and varicose veins.

## 2015-08-28 ENCOUNTER — Other Ambulatory Visit (HOSPITAL_COMMUNITY): Payer: Self-pay | Admitting: Interventional Radiology

## 2015-08-28 DIAGNOSIS — I872 Venous insufficiency (chronic) (peripheral): Secondary | ICD-10-CM

## 2015-09-01 ENCOUNTER — Other Ambulatory Visit (HOSPITAL_COMMUNITY): Payer: Self-pay | Admitting: Interventional Radiology

## 2015-09-01 DIAGNOSIS — I872 Venous insufficiency (chronic) (peripheral): Secondary | ICD-10-CM

## 2015-09-01 DIAGNOSIS — L03115 Cellulitis of right lower limb: Secondary | ICD-10-CM

## 2015-09-01 DIAGNOSIS — L03116 Cellulitis of left lower limb: Secondary | ICD-10-CM

## 2015-09-07 ENCOUNTER — Other Ambulatory Visit (HOSPITAL_COMMUNITY): Payer: Self-pay | Admitting: Interventional Radiology

## 2015-09-07 DIAGNOSIS — I872 Venous insufficiency (chronic) (peripheral): Secondary | ICD-10-CM

## 2015-09-08 DIAGNOSIS — F419 Anxiety disorder, unspecified: Secondary | ICD-10-CM | POA: Diagnosis not present

## 2015-09-08 DIAGNOSIS — R7309 Other abnormal glucose: Secondary | ICD-10-CM | POA: Diagnosis not present

## 2015-09-08 DIAGNOSIS — E789 Disorder of lipoprotein metabolism, unspecified: Secondary | ICD-10-CM | POA: Diagnosis not present

## 2015-09-08 DIAGNOSIS — Z125 Encounter for screening for malignant neoplasm of prostate: Secondary | ICD-10-CM | POA: Diagnosis not present

## 2015-09-11 DIAGNOSIS — B182 Chronic viral hepatitis C: Secondary | ICD-10-CM | POA: Diagnosis not present

## 2015-09-11 DIAGNOSIS — J449 Chronic obstructive pulmonary disease, unspecified: Secondary | ICD-10-CM | POA: Diagnosis not present

## 2015-09-11 DIAGNOSIS — Z23 Encounter for immunization: Secondary | ICD-10-CM | POA: Diagnosis not present

## 2015-09-11 DIAGNOSIS — F339 Major depressive disorder, recurrent, unspecified: Secondary | ICD-10-CM | POA: Diagnosis not present

## 2015-09-11 DIAGNOSIS — K219 Gastro-esophageal reflux disease without esophagitis: Secondary | ICD-10-CM | POA: Diagnosis not present

## 2015-09-29 ENCOUNTER — Ambulatory Visit
Admission: RE | Admit: 2015-09-29 | Discharge: 2015-09-29 | Disposition: A | Discharge status: Home / Self Care | Payer: Medicare Other | Source: Ambulatory Visit | Attending: Interventional Radiology | Admitting: Interventional Radiology

## 2015-09-29 DIAGNOSIS — I872 Venous insufficiency (chronic) (peripheral): Secondary | ICD-10-CM

## 2015-09-29 DIAGNOSIS — I83891 Varicose veins of right lower extremities with other complications: Secondary | ICD-10-CM | POA: Diagnosis not present

## 2015-09-29 NOTE — Progress Notes (Signed)
1245  Informed consent obtained.  Patient states that he understands.  Given verbal & written discharge instructions.   1305  Valium 10 mg po (pre-procedure per Dr. Fredia SorrowYamagata.    1345  Procedure started.  1510  Procedure completed.   1530 Wearing thigh high graduated compression garment on RLE (20-30 mm Hg).   1540  Ambulated x 10 minutes without assistance.  Gait steady.    1550 Reviewed discharge instructions with patient.  1600  Patient's wife here to provide transportation.  Discharged to home.  Allison Deshotels PackwaukeeGales, RN 09/29/2015 4:00 PM

## 2015-10-06 ENCOUNTER — Ambulatory Visit
Admission: RE | Admit: 2015-10-06 | Discharge: 2015-10-06 | Disposition: A | Discharge status: Home / Self Care | Payer: Medicare Other | Source: Ambulatory Visit | Attending: Interventional Radiology | Admitting: Interventional Radiology

## 2015-10-06 DIAGNOSIS — I872 Venous insufficiency (chronic) (peripheral): Secondary | ICD-10-CM

## 2015-10-06 NOTE — Progress Notes (Signed)
Chief Complaint: Chronic Venous Insufficiency  History of Present Illness: Bryan Powell is a 65 y.o. male returning today for a scheduled follow up, SP endovenous ablation of right LE GSV and targeted sclerotherapy of venous varicosities, performed by Dr. Fredia Sorrow on 09/29/2015.    He has recovered well, and denies any fevers rigors or chills.  He reports some tenderness/pain in the right inguinal region, but there is no concern for infection.  Duplex is negative for DVT.   He  reports wearing his compression stockings after the procedure, and is in fact wearing his thigh-high left stockings during our exam and interview. He has had no drainage from the site.      Duplex exam is negative for DVT, and the GSV has been treated, as well as multiple varicose veins on the right.  There are a few persisting varicose veins.    Past Medical History  Diagnosis Date  . Reflux   . Lower extremity venous stasis   . Genital herpes   . GERD (gastroesophageal reflux disease)   . Hep C w/o coma, chronic (HCC)   . Anxiety   . Depression   . ED (erectile dysfunction)   . Heroin abuse   . Alcohol abuse   . Poor venous access     hx. of Heroin, Alcohol abuse "extreme difficult vein access"    Past Surgical History  Procedure Laterality Date  . Tonsillectomy    . Orbital fracture surgery    . Cholecystectomy    . I&d extremity  06/09/2012    Procedure: IRRIGATION AND DEBRIDEMENT EXTREMITY;  Surgeon: Sharma Covert, MD;  Location: [facility] OR;  Service: Orthopedics;  Laterality: Left;    Allergies: Review of patient's allergies indicates no known allergies.  Medications: Prior to Admission medications   Medication Sig Start Date End Date Taking? Authorizing Provider  esomeprazole (NEXIUM) 20 MG capsule Take 1 capsule (20 mg total) by mouth daily at 12 noon. For acid reflux 10/09/13   Sanjuana Kava, NP  loratadine (CLARITIN) 10 MG tablet Take 1 tablet (10 mg total) by mouth daily as needed  for allergies. 01/03/14   Blake Divine, MD  Multiple Vitamin (MULTIVITAMIN WITH MINERALS) TABS tablet Take 1 tablet by mouth daily. For low vitamin Patient taking differently: Take 1 tablet by mouth daily. Centrum Silver-Take one daily 10/09/13   Sanjuana Kava, NP  Naproxen Sodium (ALEVE PO) Take 220 mg by mouth as needed (headache).     Historical Provider, MD  Probiotic Product (SOLUBLE FIBER/PROBIOTICS PO) Take 1 capsule by mouth daily.    Historical Provider, MD  QUEtiapine (SEROQUEL) 100 MG tablet Take 100 mg by mouth at bedtime.    Historical Provider, MD  sildenafil (REVATIO) 20 MG tablet Take 20 mg by mouth daily as needed.  06/30/14   Historical Provider, MD  traMADol Janean Sark) 50 MG tablet  03/12/15   Historical Provider, MD  valACYclovir (VALTREX) 500 MG tablet Take 500 mg by mouth daily as needed.  05/10/14   Historical Provider, MD     Family History  Problem Relation Age of Onset  . Stroke Mother   . Heart disease Father     Social History   Social History  . Marital Status: Married    Spouse Name: N/A  . Number of Children: N/A  . Years of Education: N/A   Social History Main Topics  . Smoking status: Former Smoker    Types: Cigarettes    Quit date: 04/15/2012  .  Smokeless tobacco: Never Used  . Alcohol Use: No     Comment: in rehab  . 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 Sexual Activity: Not on file   Other Topics Concern  . Not on file   Social History Narrative      Review of Systems: A 12 point ROS discussed and pertinent positives are indicated in the HPI above.  All other systems are negative.  Review of Systems  Vital Signs: There were no vitals taken for this visit.  Physical Exam  Targeted physical exam shows no evidence of erythema or ecchymosis at the puncture site of proximal right calf.  No drainage or ballotable fluid collection. Skin changes of chronic venous insufficiency persist.    Imaging: [place] Injec  Sclerotherapy Single  09/29/2015  CLINICAL DATA:  Severe superficial venous insufficiency of bilateral great saphenous and short saphenous veins with chronic venous stasis and history of ulcerations and recurrent cellulitis. The patient has failed conservative management with compression garments and now presents for initial treatment of the right lower extremity. Based on severity of insufficiency, the great saphenous vein will be treated today an the short saphenous vein followed after treatment of the GSV. EXAM: 1. ULTRASOUND-GUIDED SCLEROTHERAPY OF RIGHT LOWER EXTREMITY VARICOSITIES 2. ULTRASOUND-GUIDED ENDO VENOUS LASER OCCLUSION OF RIGHT GREAT SAPHENOUS VEIN MEDICATIONS: MEDICATIONS None. ANESTHESIA/SEDATION: The patient received 10 mg oral Valium prior to the procedure. COMPLICATIONS: None. PROCEDURE: The procedure was explained to the patient. The risks and benefits of the procedure were discussed and that patient's questions were addressed. Informed consent was obtained from the patient. The patient was placed in a supine position. The right lower extremity superficial venous system was evaluated with ultrasound. The right great saphenous vein was identified and the vein path was marked on the skin. The right leg and groin were prepped with chlorhexidine and a sterile drape was placed. After local anesthesia with 1% lidocaine, a 21 gauge needle was directed into the great saphenous vein with ultrasound guidance and a micropuncture dilator set was placed. Wire was advanced into the proximal right great saphenous vein. A 0.035 wire was advanced to the saphenofemoral junction with ultrasound guidance. A 45 cm sheath was placed over the wire and positioned distal to the saphenofemoral junction. Sclerotherapy of right calf varicosities was performed with 1% polidocanol. 2 mL of polidocanol was foamed with an equal volume of air. Several injections of varicosities was performed under direct ultrasound guidance  with a 25 gauge needle. Dilute lidocaine was used as a tumescent. Tumescent anesthetic was instilled with a pump sat and 22 gauge spinal needle under direct ultrasound guidance. The great saphenous vein from the percutaneous entry site to the saphenofemoral junction was treated with 325 ml of the tumescent. The Angiodynamics VenaCure 1470 laser was set at 6 W. The laser treatment was performed with 1483 joules over 247 seconds. The sheath and laser were removed as a single entity. Manual compression was placed over the percutaneous entry site. Dressings were placed over the puncture sites and the leg was placed in a compression stocking. FINDINGS: The right great saphenous vein becomes superficial in the proximal calf. The lowest level that the vein could be accessed was at the mid calf level. Below this level, the vein merges with tortuous varicosities which would not allow advancement of a sheath. After gaining access into the vein, the sheath and laser fiber were positioned. Just prior to treatment, the laser fiber  tip lies 29 mm below the level of the anatomic saphenofemoral junction. Just prior to laser treatment, sclerotherapy was performed of extensive varicosities of the mid to distal calf below the level of laser treatment. Laser ablation of the great saphenous vein was uncomplicated. IMPRESSION: Successful endovenous laser occlusion of the right great saphenous vein with ultrasound guidance. Prior to laser therapy, mid and distal calf varices were treated with regional sclerotherapy under ultrasound guidance. The right great saphenous vein was accessed at the mid calf level for laser treatment. Electronically Signed   By: Irish Lack M.D.   On: 09/29/2015 17:14   Ir Embo Venous Not Hemorr [employer] Guide Roadmapping  09/29/2015  CLINICAL DATA:  Severe superficial venous insufficiency of bilateral great saphenous and short saphenous veins with chronic venous stasis and history of ulcerations and  recurrent cellulitis. The patient has failed conservative management with compression garments and now presents for initial treatment of the right lower extremity. Based on severity of insufficiency, the great saphenous vein will be treated today an the short saphenous vein followed after treatment of the GSV. EXAM: 1. ULTRASOUND-GUIDED SCLEROTHERAPY OF RIGHT LOWER EXTREMITY VARICOSITIES 2. ULTRASOUND-GUIDED ENDO VENOUS LASER OCCLUSION OF RIGHT GREAT SAPHENOUS VEIN MEDICATIONS: MEDICATIONS None. ANESTHESIA/SEDATION: The patient received 10 mg oral Valium prior to the procedure. COMPLICATIONS: None. PROCEDURE: The procedure was explained to the patient. The risks and benefits of the procedure were discussed and that patient's questions were addressed. Informed consent was obtained from the patient. The patient was placed in a supine position. The right lower extremity superficial venous system was evaluated with ultrasound. The right great saphenous vein was identified and the vein path was marked on the skin. The right leg and groin were prepped with chlorhexidine and a sterile drape was placed. After local anesthesia with 1% lidocaine, a 21 gauge needle was directed into the great saphenous vein with ultrasound guidance and a micropuncture dilator set was placed. Wire was advanced into the proximal right great saphenous vein. A 0.035 wire was advanced to the saphenofemoral junction with ultrasound guidance. A 45 cm sheath was placed over the wire and positioned distal to the saphenofemoral junction. Sclerotherapy of right calf varicosities was performed with 1% polidocanol. 2 mL of polidocanol was foamed with an equal volume of air. Several injections of varicosities was performed under direct ultrasound guidance with a 25 gauge needle. Dilute lidocaine was used as a tumescent. Tumescent anesthetic was instilled with a pump sat and 22 gauge spinal needle under direct ultrasound guidance. The great saphenous vein  from the percutaneous entry site to the saphenofemoral junction was treated with 325 ml of the tumescent. The Angiodynamics VenaCure 1470 laser was set at 6 W. The laser treatment was performed with 1483 joules over 247 seconds. The sheath and laser were removed as a single entity. Manual compression was placed over the percutaneous entry site. Dressings were placed over the puncture sites and the leg was placed in a compression stocking. FINDINGS: The right great saphenous vein becomes superficial in the proximal calf. The lowest level that the vein could be accessed was at the mid calf level. Below this level, the vein merges with tortuous varicosities which would not allow advancement of a sheath. After gaining access into the vein, the sheath and laser fiber were positioned. Just prior to treatment, the laser fiber tip lies 29 mm below the level of the anatomic saphenofemoral junction. Just prior to laser treatment, sclerotherapy was performed of extensive varicosities of the  mid to distal calf below the level of laser treatment. Laser ablation of the great saphenous vein was uncomplicated. IMPRESSION: Successful endovenous laser occlusion of the right great saphenous vein with ultrasound guidance. Prior to laser therapy, mid and distal calf varices were treated with regional sclerotherapy under ultrasound guidance. The right great saphenous vein was accessed at the mid calf level for laser treatment. Electronically Signed   By: Irish LackGlenn  Yamagata M.D.   On: 09/29/2015 17:14    Labs:  CBC: No results for input(s): WBC, HGB, HCT, PLT in the last 8760 hours.  COAGS: No results for input(s): INR, APTT in the last 8760 hours.  BMP: No results for input(s): NA, K, CL, CO2, GLUCOSE, BUN, CALCIUM, CREATININE, GFRNONAA, GFRAA in the last 8760 hours.  Invalid input(s): CMP  LIVER FUNCTION TESTS: No results for input(s): BILITOT, AST, ALT, ALKPHOS, PROT, ALBUMIN in the last 8760 hours.  TUMOR MARKERS: No  results for input(s): AFPTM, CEA, CA199, CHROMGRNA in the last 8760 hours.  Assessment and Plan:  65 year old male, 1 week SP endovenous laser ablation and sclerotherapy of right GSV and associated varicose veins.    No concerns for infection, with duplex demonstrating treated vein and no DVT.    He understands to continue wearing his compression stockings as instructed, and he will have a follow up exam at 1 month post-procedure for repeat duplex and possible further treatment.    I answered all of his questions.     Electronically Signed: Gilmer MorWAGNER, Daymon Hora 10/06/2015, 2:16 PM   I spent a total of    15 Minutes in face to face in clinical consultation, greater than 50% of which was counseling/coordinating care for chronic venous insufficiency, bilateral lower extremity, SP right GSV endovenous ablation with targeted varicose sclerotherapy.

## 2015-11-11 ENCOUNTER — Other Ambulatory Visit: Payer: Self-pay

## 2015-11-11 ENCOUNTER — Ambulatory Visit: Payer: Self-pay

## 2015-12-24 ENCOUNTER — Other Ambulatory Visit: Payer: Medicare Other

## 2015-12-24 ENCOUNTER — Ambulatory Visit: Payer: Medicare Other

## 2015-12-29 ENCOUNTER — Emergency Department (HOSPITAL_COMMUNITY)
Admission: EM | Admit: 2015-12-29 | Discharge: 2015-12-29 | Discharge status: Against Medical Advice | Payer: Medicare Other | Attending: Emergency Medicine | Admitting: Emergency Medicine

## 2015-12-29 ENCOUNTER — Encounter (HOSPITAL_COMMUNITY): Payer: Self-pay | Admitting: *Deleted

## 2015-12-29 DIAGNOSIS — S80811A Abrasion, right lower leg, initial encounter: Secondary | ICD-10-CM | POA: Diagnosis not present

## 2015-12-29 DIAGNOSIS — Y9302 Activity, running: Secondary | ICD-10-CM | POA: Insufficient documentation

## 2015-12-29 DIAGNOSIS — S098XXA Other specified injuries of head, initial encounter: Secondary | ICD-10-CM | POA: Diagnosis not present

## 2015-12-29 DIAGNOSIS — Z87891 Personal history of nicotine dependence: Secondary | ICD-10-CM | POA: Diagnosis not present

## 2015-12-29 DIAGNOSIS — Y999 Unspecified external cause status: Secondary | ICD-10-CM | POA: Insufficient documentation

## 2015-12-29 DIAGNOSIS — S8991XA Unspecified injury of right lower leg, initial encounter: Secondary | ICD-10-CM | POA: Diagnosis present

## 2015-12-29 DIAGNOSIS — Y929 Unspecified place or not applicable: Secondary | ICD-10-CM | POA: Insufficient documentation

## 2015-12-29 DIAGNOSIS — X58XXXA Exposure to other specified factors, initial encounter: Secondary | ICD-10-CM | POA: Diagnosis not present

## 2015-12-29 DIAGNOSIS — S0180XA Unspecified open wound of other part of head, initial encounter: Secondary | ICD-10-CM | POA: Diagnosis not present

## 2015-12-29 LAB — CBG MONITORING, ED: GLUCOSE-CAPILLARY: 98 mg/dL (ref 65–99)

## 2015-12-29 NOTE — ED Notes (Signed)
CBG 98  

## 2015-12-29 NOTE — ED Notes (Signed)
Pt upset that "they're holding me here for three hours and I'm ready to leave." Informed pt wife is enroute to pick him up

## 2015-12-29 NOTE — ED Notes (Addendum)
Pt to doorway requesting to leave AMA; pt A&Ox4 Provider at bedside

## 2015-12-29 NOTE — ED Notes (Signed)
Unable to locate pt in room.

## 2015-12-29 NOTE — ED Provider Notes (Signed)
CSN: [id number]     Arrival date & time 12/29/15  1720 History   First MD Initiated Contact with Patient 12/29/15 1721     Chief Complaint  Patient presents with  . Altered Mental Status   HPI Pt was running outside without his shirt. He was noted to have wound on his right leg that was bleeding. Someone called EMS. Pt admits he was drinking a couple of beers this am. He told EMS he snorted cocaine but he denies this. Pt denies any complaints right now and would like to go home. He denies chest pain, or headache. No fevers or chills.  Patient states he feels fine. He just wants to get back to see his family. Past Medical History  Diagnosis Date  . Reflux   . Lower extremity venous stasis   . Genital herpes   . GERD (gastroesophageal reflux disease)   . Hep C w/o coma, chronic (HCC)   . Anxiety   . Depression   . ED (erectile dysfunction)   . Heroin abuse   . Alcohol abuse   . Poor venous access     hx. of Heroin, Alcohol abuse "extreme difficult vein access"   Past Surgical History  Procedure Laterality Date  . Tonsillectomy    . Orbital fracture surgery    . Cholecystectomy    . I&d extremity  06/09/2012    Procedure: IRRIGATION AND DEBRIDEMENT EXTREMITY;  Surgeon: Bryan Covert, MD;  Location: [facility] OR;  Service: Orthopedics;  Laterality: Left;   Family History  Problem Relation Age of Onset  . Stroke Mother   . Heart disease Father    Social History  Substance Use Topics  . Smoking status: Former Smoker    Types: Cigarettes    Quit date: 04/15/2012  . Smokeless tobacco: Never Used  . Alcohol Use: 0.0 oz/week    0 Standard drinks or equivalent per week     Comment: in rehab    Review of Systems  All other systems reviewed and are negative.     Allergies  Review of patient's allergies indicates no known allergies.  Home Medications   Prior to Admission medications   Medication Sig Start Date End Date Taking? Authorizing Provider  esomeprazole  (NEXIUM) 20 MG capsule Take 1 capsule (20 mg total) by mouth daily at 12 noon. For acid reflux 10/09/13   Sanjuana Kava, NP  loratadine (CLARITIN) 10 MG tablet Take 1 tablet (10 mg total) by mouth daily as needed for allergies. 01/03/14   Blake Divine, MD  Multiple Vitamin (MULTIVITAMIN WITH MINERALS) TABS tablet Take 1 tablet by mouth daily. For low vitamin Patient taking differently: Take 1 tablet by mouth daily. Centrum Silver-Take one daily 10/09/13   Sanjuana Kava, NP  Naproxen Sodium (ALEVE PO) Take 220 mg by mouth as needed (headache).     Historical Provider, MD  Probiotic Product (SOLUBLE FIBER/PROBIOTICS PO) Take 1 capsule by mouth daily.    Historical Provider, MD  QUEtiapine (SEROQUEL) 100 MG tablet Take 100 mg by mouth at bedtime.    Historical Provider, MD  sildenafil (REVATIO) 20 MG tablet Take 20 mg by mouth daily as needed.  06/30/14   Historical Provider, MD  traMADol Janean Sark) 50 MG tablet  03/12/15   Historical Provider, MD  valACYclovir (VALTREX) 500 MG tablet Take 500 mg by mouth daily as needed.  05/10/14   Historical Provider, MD   BP 120/88 mmHg  Pulse 113  Temp(Src) 97.9 F (36.6 C)  Resp 16  SpO2 96% Physical Exam  Constitutional: He is oriented to person, place, and time. He appears well-developed and well-nourished. No distress.  HENT:  Head: Normocephalic and atraumatic.  Right Ear: External ear normal.  Left Ear: External ear normal.  Mouth/Throat: Oropharynx is clear and moist.  Eyes: Conjunctivae are normal. Right eye exhibits no discharge. Left eye exhibits no discharge. No scleral icterus.  Neck: Neck supple. No tracheal deviation present.  Cardiovascular: Normal rate, regular rhythm and intact distal pulses.   Pulmonary/Chest: Effort normal and breath sounds normal. No stridor. No respiratory distress. He has no wheezes. He has no rales.  Abdominal: Soft. Bowel sounds are normal. He exhibits no distension. There is no tenderness. There is no rebound and no  guarding.  Musculoskeletal: He exhibits no edema or tenderness.  Small abrasion right lower extremity, no active bleeding, some dried blood on the foot  Neurological: He is alert and oriented to person, place, and time. He has normal strength. No cranial nerve deficit (No facial droop, extraocular movements intact, tongue midline ) or sensory deficit. He exhibits normal muscle tone. He displays no seizure activity. Coordination normal.  No pronator drift bilateral upper extrem, able to hold both legs off bed for 5 seconds, sensation intact in all extremities, no visual field cuts, no left or right sided neglect,  Walking around without difficulty  Skin: Skin is warm and dry. No rash noted.  Psychiatric: He has a normal mood and affect.  Nursing note and vitals reviewed.   ED Course  Procedures (including critical care time) Labs Review Labs Reviewed  CBG MONITORING, ED    Imaging Review No results found. I have personally reviewed and evaluated these images and lab results as part of my medical decision-making.   MDM    1820  Pt has requested to leave the ED.  He denies any complaints at this time.  He is alert and answering questions appropriately.  His history is a bit suspicious but I suspect it may be related to drugs or alcohol.  Clinically his speech is not slurred and he is speaking appropriately.     He does not want any testing. I asked him to get in touch with family so someone can take him home.  Family member was contacted and will pick him up.  Bryan DibblesJon Hildred Pharo, MD 12/30/15 [phone]

## 2015-12-29 NOTE — ED Notes (Addendum)
Pt to ED by EMS c/o AMS. Bystanders called EMS after pt was running around a neighbor with bleeding from R lower leg. Pt A&Ox2 on EMS arrival, blood soaked bandage to R lower leg. Reports having a hx of cellulitis and that's why his leg was bleeding. c-collar in place. Puncture wound to R leg, bleeding controlled at present. Pt initally told EMS he snorted cocaine and drank a beer this morning. Pt denies using drugs at present, has hx of drug use

## 2015-12-29 NOTE — ED Notes (Signed)
Spoke to pt's wife on the phone per pt request, wife is on the way to pick pt up.

## 2015-12-29 NOTE — ED Notes (Signed)
Pt attempting to find a ride for discharge per provider. Pt upset of having to call a ride rather than leaving on his own

## 2015-12-29 NOTE — ED Notes (Signed)
Pt getting dress, requesting to leave AMA again, Dr.Knapp made aware

## 2016-02-09 ENCOUNTER — Inpatient Hospital Stay: Admission: RE | Admit: 2016-02-09 | Payer: Medicare Other | Source: Ambulatory Visit

## 2016-02-09 ENCOUNTER — Other Ambulatory Visit: Payer: Medicare Other

## 2016-02-09 ENCOUNTER — Ambulatory Visit: Payer: Medicare Other

## 2016-02-23 ENCOUNTER — Other Ambulatory Visit (HOSPITAL_COMMUNITY): Payer: Self-pay | Admitting: Interventional Radiology

## 2016-02-23 DIAGNOSIS — I872 Venous insufficiency (chronic) (peripheral): Secondary | ICD-10-CM

## 2016-03-23 ENCOUNTER — Ambulatory Visit
Admission: RE | Admit: 2016-03-23 | Discharge: 2016-03-23 | Disposition: A | Discharge status: Home / Self Care | Payer: Medicare Other | Source: Ambulatory Visit | Attending: Interventional Radiology | Admitting: Interventional Radiology

## 2016-03-23 DIAGNOSIS — I872 Venous insufficiency (chronic) (peripheral): Secondary | ICD-10-CM

## 2016-03-23 DIAGNOSIS — I82811 Embolism and thrombosis of superficial veins of right lower extremities: Secondary | ICD-10-CM | POA: Diagnosis not present

## 2016-03-23 NOTE — Progress Notes (Signed)
Chief Complaint: Venous insufficiency with prior endovenous laser occlusion of the right great saphenous vein.  History of Present Illness: Bryan Powell is a 65 y.o. male status post endovenous laser occlusion of the right great saphenous vein with additional ultrasound-guided sclerotherapy of right lower extremity varicosities on 09/29/2015 to treat chronic venous stasis secondary to severe superficial venous insufficiency. After the procedure he did have an initial 1 week follow-up exam on 10/06/2015. He was set up to have laser treatment of his left lower extremity to treat similar venous insufficiency but did not come in for his appointments.  Bryan Powell has been wearing his compression garments regularly. He feels that symptoms in the right lower extremity are slightly improved compared to prior to treatment. He does have persistent discoloration of his right lower leg below the knee. Edema has improved since treatment. Symptoms of edema and discomfort in the left lower extremity are stable and have not worsened significantly.   Past Medical History:  Diagnosis Date  . Alcohol abuse   . Anxiety   . Depression   . ED (erectile dysfunction)   . Genital herpes   . GERD (gastroesophageal reflux disease)   . Hep C w/o coma, chronic (HCC)   . Heroin abuse   . Lower extremity venous stasis   . Poor venous access    hx. of Heroin, Alcohol abuse "extreme difficult vein access"  . Reflux     Past Surgical History:  Procedure Laterality Date  . CHOLECYSTECTOMY    . I&D EXTREMITY  06/09/2012   Procedure: IRRIGATION AND DEBRIDEMENT EXTREMITY;  Surgeon: Sharma Covert, MD;  Location: [facility] OR;  Service: Orthopedics;  Laterality: Left;  . ORBITAL FRACTURE SURGERY    . TONSILLECTOMY      Allergies: Review of patient's allergies indicates no known allergies.  Medications: Prior to Admission medications   Medication Sig Start Date End Date Taking? Authorizing Provider    esomeprazole (NEXIUM) 20 MG capsule Take 1 capsule (20 mg total) by mouth daily at 12 noon. For acid reflux 10/09/13   Sanjuana Kava, NP  loratadine (CLARITIN) 10 MG tablet Take 1 tablet (10 mg total) by mouth daily as needed for allergies. 01/03/14   Blake Divine, MD  Multiple Vitamin (MULTIVITAMIN WITH MINERALS) TABS tablet Take 1 tablet by mouth daily. For low vitamin Patient taking differently: Take 1 tablet by mouth daily. Centrum Silver-Take one daily 10/09/13   Sanjuana Kava, NP  Naproxen Sodium (ALEVE PO) Take 220 mg by mouth as needed (headache).     Historical Provider, MD  Probiotic Product (SOLUBLE FIBER/PROBIOTICS PO) Take 1 capsule by mouth daily.    Historical Provider, MD  QUEtiapine (SEROQUEL) 100 MG tablet Take 100 mg by mouth at bedtime.    Historical Provider, MD  sildenafil (REVATIO) 20 MG tablet Take 20 mg by mouth daily as needed.  06/30/14   Historical Provider, MD  traMADol Janean Sark) 50 MG tablet  03/12/15   Historical Provider, MD  valACYclovir (VALTREX) 500 MG tablet Take 500 mg by mouth daily as needed.  05/10/14   Historical Provider, MD     Family History  Problem Relation Age of Onset  . Stroke Mother   . Heart disease Father     Social History   Social History  . Marital status: Married    Spouse name: N/A  . Number of children: N/A  . Years of education: N/A   Social History Main Topics  . Smoking status: Former  Smoker    Types: Cigarettes    Quit date: 04/15/2012  . Smokeless tobacco: Never Used  . Alcohol use 0.0 oz/week     Comment: in rehab  . Drug use: No     Comment: in rehab - clean since 4'2015  . Sexual activity: Not on file   Other Topics Concern  . Not on file   Social History Narrative  . No narrative on file    Review of Systems: A 12 point ROS discussed and pertinent positives are indicated in the HPI above.  All other systems are negative.  Review of Systems  Constitutional: Negative.   Respiratory: Negative.    Cardiovascular: Negative.   Gastrointestinal: Negative.   Genitourinary: Negative.   Musculoskeletal:       Chronic lower leg skin hyperpigmentation, R>L. Chronic lower leg edema. Edema of right leg appears improved compared to prior to treatment.  Neurological: Negative.     Vital Signs: There were no vitals taken for this visit.  Physical Exam Right lower extremity: Small healing wound of the anterior pretibial region which the patient states is due to a recent injury. Stable chronic hyperpigmentation of the skin below the knee. Very mild edema below the knee. No active ulcerations. Left lower extremity: No ulcerations. Stable edema below the knee. Mild skin hyperpigmentation below the knee.  Assessment and Plan:  On exam today, edema of the right lower extremity appears improved compared to prior to treatment. Follow-up ultrasound performed today demonstrates complete occlusion of the treated segment of the right great saphenous vein after laser treatment. Some varicosities remain open in the calf below the level of laser treatment due to communication with incompetent perforator veins. Currently, these residual open varicosities do not seem to be causing significant symptoms. I have recommended that he continue to wear his compression garments regularly during the day. Symptoms of the left lower extremity appear stabilized and not progressively worse. I recommended a follow-up in 6 months to follow symptoms in both lower extremities.  Electronically SignedIrish Lack: Flor Whitacre T 03/23/2016, 1:36 PM   I spent a total of 15 Minutes in face to face in clinical consultation, greater than 50% of which was counseling/coordinating care for bilateral lower extremity venous insufficiency.

## 2016-03-29 DIAGNOSIS — Z23 Encounter for immunization: Secondary | ICD-10-CM | POA: Diagnosis not present

## 2016-04-14 DIAGNOSIS — H40013 Open angle with borderline findings, low risk, bilateral: Secondary | ICD-10-CM | POA: Diagnosis not present

## 2016-04-14 DIAGNOSIS — H2511 Age-related nuclear cataract, right eye: Secondary | ICD-10-CM | POA: Diagnosis not present

## 2016-04-14 DIAGNOSIS — H25812 Combined forms of age-related cataract, left eye: Secondary | ICD-10-CM | POA: Diagnosis not present

## 2016-04-19 ENCOUNTER — Emergency Department (HOSPITAL_COMMUNITY): Payer: Medicare Other

## 2016-04-19 ENCOUNTER — Encounter (HOSPITAL_COMMUNITY): Payer: Self-pay | Admitting: Emergency Medicine

## 2016-04-19 ENCOUNTER — Emergency Department (HOSPITAL_COMMUNITY)
Admission: EM | Admit: 2016-04-19 | Discharge: 2016-04-19 | Disposition: A | Discharge status: Home / Self Care | Payer: Medicare Other | Attending: Emergency Medicine | Admitting: Emergency Medicine

## 2016-04-19 DIAGNOSIS — Y999 Unspecified external cause status: Secondary | ICD-10-CM | POA: Diagnosis not present

## 2016-04-19 DIAGNOSIS — Y939 Activity, unspecified: Secondary | ICD-10-CM | POA: Insufficient documentation

## 2016-04-19 DIAGNOSIS — W01190A Fall on same level from slipping, tripping and stumbling with subsequent striking against furniture, initial encounter: Secondary | ICD-10-CM | POA: Diagnosis not present

## 2016-04-19 DIAGNOSIS — Y929 Unspecified place or not applicable: Secondary | ICD-10-CM | POA: Insufficient documentation

## 2016-04-19 DIAGNOSIS — S20211A Contusion of right front wall of thorax, initial encounter: Secondary | ICD-10-CM | POA: Insufficient documentation

## 2016-04-19 DIAGNOSIS — S299XXA Unspecified injury of thorax, initial encounter: Secondary | ICD-10-CM | POA: Diagnosis not present

## 2016-04-19 DIAGNOSIS — R0781 Pleurodynia: Secondary | ICD-10-CM | POA: Diagnosis not present

## 2016-04-19 DIAGNOSIS — Z87891 Personal history of nicotine dependence: Secondary | ICD-10-CM | POA: Insufficient documentation

## 2016-04-19 MED ORDER — HYDROCODONE-ACETAMINOPHEN 5-325 MG PO TABS
1.0000 | ORAL_TABLET | Freq: Once | ORAL | Status: AC
  Administered 2016-04-19: 1 via ORAL
  Filled 2016-04-19: qty 1

## 2016-04-19 NOTE — ED Triage Notes (Signed)
Pt states "i want someone to check my ribs, I got up last night, I stood up too quickly and I fell over and landed on my right side." Pt endorses rib pain on r side, tender to palpation. Denies hitting head. Denies LOC. Is not taking blood thinners.

## 2016-04-19 NOTE — Discharge Instructions (Signed)
Ibuprofen as needed for pain

## 2016-04-19 NOTE — ED Provider Notes (Signed)
[facility]-EM[facility]ENCY DEPT Provider Note   CSN: [id number] Arrival date & time: 04/19/16  1202     History   Chief Complaint Chief Complaint  Patient presents with  . Chest Pain    HPI Bryan Powell is a 65 y.o. male. Resists evaluation right rib pain. He started last night and felt dizzy. He felt in his right instructed ribs against a piece of furniture. He complains of pain in the right lateral chest. No subjective crepitus. Pain with breathing.  HPI  Past Medical History:  Diagnosis Date  . Alcohol abuse   . Anxiety   . Depression   . ED (erectile dysfunction)   . Genital herpes   . GERD (gastroesophageal reflux disease)   . Hep C w/o coma, chronic (HCC)   . Heroin abuse   . Lower extremity venous stasis   . Poor venous access    hx. of Heroin, Alcohol abuse "extreme difficult vein access"  . Reflux     Patient Active Problem List   Diagnosis Date Noted  . Bilateral lower leg cellulitis   . Venous insufficiency of both lower extremities   . Bilateral lower extremity edema   . Varicose veins of lower extremities with ulcer (HCC)   . Severe opioid use disorder (HCC) 10/05/2013  . Abscess of left hip 10/04/2012  . Finger infection 06/09/2012  . Recurrent cellulitis of lower leg 10/12/2011  . Lower extremity venous stasis   . Reflux   . ALLERGIC RHINITIS 11/02/2007  . ERECTILE DYSFUNCTION 11/02/2007  . GENITAL HERPES 05/23/2007  . Chronic hepatitis C virus infection (HCC) 05/23/2007  . ANXIETY 05/23/2007  . HEROIN ABUSE 05/23/2007  . GERD 05/23/2007  . LOW BACK PAIN 05/23/2007    Past Surgical History:  Procedure Laterality Date  . CHOLECYSTECTOMY    . I&D EXTREMITY  06/09/2012   Procedure: IRRIGATION AND DEBRIDEMENT EXTREMITY;  Surgeon: Sharma Covert, MD;  Location: [facility] OR;  Service: Orthopedics;  Laterality: Left;  . ORBITAL FRACTURE SURGERY    . TONSILLECTOMY         Home Medications    Prior to Admission medications   Medication Sig Start Date  End Date Taking? Authorizing Provider  esomeprazole (NEXIUM) 20 MG capsule Take 1 capsule (20 mg total) by mouth daily at 12 noon. For acid reflux 10/09/13   Sanjuana Kava, NP  loratadine (CLARITIN) 10 MG tablet Take 1 tablet (10 mg total) by mouth daily as needed for allergies. 01/03/14   Blake Divine, MD  Multiple Vitamin (MULTIVITAMIN WITH MINERALS) TABS tablet Take 1 tablet by mouth daily. For low vitamin Patient taking differently: Take 1 tablet by mouth daily. Centrum Silver-Take one daily 10/09/13   Sanjuana Kava, NP  Naproxen Sodium (ALEVE PO) Take 220 mg by mouth as needed (headache).     Historical Provider, MD  Probiotic Product (SOLUBLE FIBER/PROBIOTICS PO) Take 1 capsule by mouth daily.    Historical Provider, MD  QUEtiapine (SEROQUEL) 100 MG tablet Take 100 mg by mouth at bedtime.    Historical Provider, MD  sildenafil (REVATIO) 20 MG tablet Take 20 mg by mouth daily as needed.  06/30/14   Historical Provider, MD  traMADol Janean Sark) 50 MG tablet  03/12/15   Historical Provider, MD  valACYclovir (VALTREX) 500 MG tablet Take 500 mg by mouth daily as needed.  05/10/14   Historical Provider, MD    Family History Family History  Problem Relation Age of Onset  . Stroke Mother   . Heart  disease Father     Social History Social History  Substance Use Topics  . Smoking status: Former Smoker    Types: Cigarettes    Quit date: 04/15/2012  . Smokeless tobacco: Never Used  . Alcohol use 0.0 oz/week     Comment: in rehab     Allergies   Patient has no known allergies.   Review of Systems Review of Systems  Constitutional: Negative for appetite change, chills, diaphoresis, fatigue and fever.  HENT: Negative for mouth sores, sore throat and trouble swallowing.   Eyes: Negative for visual disturbance.  Respiratory: Negative for cough, chest tightness, shortness of breath and wheezing.   Cardiovascular: Positive for chest pain.  Gastrointestinal: Negative for abdominal distention,  abdominal pain, diarrhea, nausea and vomiting.  Endocrine: Negative for polydipsia, polyphagia and polyuria.  Genitourinary: Negative for dysuria, frequency and hematuria.  Musculoskeletal: Negative for gait problem.  Skin: Negative for color change, pallor and rash.  Neurological: Negative for dizziness, syncope, light-headedness and headaches.  Hematological: Does not bruise/bleed easily.  Psychiatric/Behavioral: Negative for behavioral problems and confusion.     Physical Exam Updated Vital Signs BP 146/80   Pulse 99   Temp 98 F (36.7 C)   Resp 18   SpO2 98%   Physical Exam  Constitutional: He is oriented to person, place, and time. He appears well-developed and well-nourished. No distress.  HENT:  Head: Normocephalic.  Eyes: Conjunctivae are normal. Pupils are equal, round, and reactive to light. No scleral icterus.  Neck: Normal range of motion. Neck supple. No thyromegaly present.  Cardiovascular: Normal rate and regular rhythm.  Exam reveals no gallop and no friction rub.   No murmur heard. Pulmonary/Chest: Effort normal and breath sounds normal. No respiratory distress. He has no wheezes. He has no rales.    Abdominal: Soft. Bowel sounds are normal. He exhibits no distension. There is no tenderness. There is no rebound.  Musculoskeletal: Normal range of motion.  Neurological: He is alert and oriented to person, place, and time.  Skin: Skin is warm and dry. No rash noted.  Psychiatric: He has a normal mood and affect. His behavior is normal.     ED Treatments / Results  Labs (all labs ordered are listed, but only abnormal results are displayed) Labs Reviewed - No data to display  EKG  EKG Interpretation None       Radiology Dg Ribs Unilateral W/chest Right  Result Date: 04/19/2016 CLINICAL DATA:  Status post fall.  Lateral rib pain. EXAM: RIGHT RIBS AND CHEST - 3+ VIEW COMPARISON:  None. FINDINGS: No fracture or other bone lesions are seen involving the  ribs. There is no evidence of pneumothorax or pleural effusion. Both lungs are clear. Heart size and mediastinal contours are within normal limits. IMPRESSION: Negative. Electronically Signed   By: Elige KoHetal  Patel   On: 04/19/2016 13:27    Procedures Procedures (including critical care time)  Medications Ordered in ED Medications  HYDROcodone-acetaminophen (NORCO/VICODIN) 5-325 MG per tablet 1 tablet (1 tablet Oral Given 04/19/16 1232)     Initial Impression / Assessment and Plan / ED Course  I have reviewed the triage vital signs and the nursing notes.  Pertinent labs & imaging results that were available during my care of the patient were reviewed by me and considered in my medical decision making (see chart for details).  Clinical Course     Pain medication. Rib x-rays. Reassessment.  Final Clinical Impressions(s) / ED Diagnoses   Final diagnoses:  Contusion  of right chest wall, initial encounter   X-rays normal. Plan home, coffee breathing, ibuprofen. Patient requests "something stronger". Stated that I would not prescribe a controlled substance for bruised ribs.  New Prescriptions New Prescriptions   No medications on file     Rolland PorterMark Romaine Maciolek, MD 04/19/16 1349

## 2016-05-03 DIAGNOSIS — S2241XA Multiple fractures of ribs, right side, initial encounter for closed fracture: Secondary | ICD-10-CM | POA: Diagnosis not present

## 2016-05-03 DIAGNOSIS — S20219A Contusion of unspecified front wall of thorax, initial encounter: Secondary | ICD-10-CM | POA: Diagnosis not present

## 2016-06-15 DIAGNOSIS — F17200 Nicotine dependence, unspecified, uncomplicated: Secondary | ICD-10-CM | POA: Diagnosis not present

## 2016-06-15 DIAGNOSIS — R0602 Shortness of breath: Secondary | ICD-10-CM | POA: Diagnosis not present

## 2016-06-15 DIAGNOSIS — I872 Venous insufficiency (chronic) (peripheral): Secondary | ICD-10-CM | POA: Diagnosis not present

## 2016-06-15 DIAGNOSIS — Q211 Atrial septal defect: Secondary | ICD-10-CM | POA: Diagnosis not present

## 2016-06-21 DIAGNOSIS — Q211 Atrial septal defect: Secondary | ICD-10-CM | POA: Diagnosis not present

## 2016-06-21 DIAGNOSIS — R0602 Shortness of breath: Secondary | ICD-10-CM | POA: Diagnosis not present

## 2016-07-12 DIAGNOSIS — R0602 Shortness of breath: Secondary | ICD-10-CM | POA: Diagnosis not present

## 2016-07-12 DIAGNOSIS — Q211 Atrial septal defect: Secondary | ICD-10-CM | POA: Diagnosis not present

## 2016-07-12 DIAGNOSIS — F17200 Nicotine dependence, unspecified, uncomplicated: Secondary | ICD-10-CM | POA: Diagnosis not present

## 2016-07-12 DIAGNOSIS — I872 Venous insufficiency (chronic) (peripheral): Secondary | ICD-10-CM | POA: Diagnosis not present

## 2016-08-01 ENCOUNTER — Inpatient Hospital Stay (HOSPITAL_COMMUNITY): Payer: Medicare Other | Admitting: Anesthesiology

## 2016-08-01 ENCOUNTER — Emergency Department (HOSPITAL_COMMUNITY): Payer: Medicare Other

## 2016-08-01 ENCOUNTER — Emergency Department (HOSPITAL_COMMUNITY): Payer: Medicare Other | Admitting: Anesthesiology

## 2016-08-01 ENCOUNTER — Encounter (HOSPITAL_COMMUNITY): Admission: EM | Disposition: A | Discharge status: Home / Self Care | Payer: Self-pay | Source: Home / Self Care

## 2016-08-01 ENCOUNTER — Encounter (HOSPITAL_COMMUNITY): Payer: Self-pay | Admitting: *Deleted

## 2016-08-01 ENCOUNTER — Inpatient Hospital Stay (HOSPITAL_COMMUNITY)
Admission: EM | Admit: 2016-08-01 | Discharge: 2016-08-09 | DRG: 799 | Disposition: A | Discharge status: Home / Self Care | Payer: Medicare Other | Attending: General Surgery | Admitting: General Surgery

## 2016-08-01 DIAGNOSIS — S2232XA Fracture of one rib, left side, initial encounter for closed fracture: Secondary | ICD-10-CM

## 2016-08-01 DIAGNOSIS — Z23 Encounter for immunization: Secondary | ICD-10-CM

## 2016-08-01 DIAGNOSIS — Z8249 Family history of ischemic heart disease and other diseases of the circulatory system: Secondary | ICD-10-CM

## 2016-08-01 DIAGNOSIS — K661 Hemoperitoneum: Secondary | ICD-10-CM | POA: Diagnosis not present

## 2016-08-01 DIAGNOSIS — R778 Other specified abnormalities of plasma proteins: Secondary | ICD-10-CM | POA: Diagnosis present

## 2016-08-01 DIAGNOSIS — D62 Acute posthemorrhagic anemia: Secondary | ICD-10-CM | POA: Diagnosis not present

## 2016-08-01 DIAGNOSIS — F11129 Opioid abuse with intoxication, unspecified: Secondary | ICD-10-CM | POA: Diagnosis present

## 2016-08-01 DIAGNOSIS — R402412 Glasgow coma scale score 13-15, at arrival to emergency department: Secondary | ICD-10-CM | POA: Diagnosis present

## 2016-08-01 DIAGNOSIS — F10129 Alcohol abuse with intoxication, unspecified: Secondary | ICD-10-CM | POA: Diagnosis present

## 2016-08-01 DIAGNOSIS — R300 Dysuria: Secondary | ICD-10-CM | POA: Diagnosis not present

## 2016-08-01 DIAGNOSIS — F17210 Nicotine dependence, cigarettes, uncomplicated: Secondary | ICD-10-CM | POA: Diagnosis present

## 2016-08-01 DIAGNOSIS — F418 Other specified anxiety disorders: Secondary | ICD-10-CM | POA: Diagnosis present

## 2016-08-01 DIAGNOSIS — J95821 Acute postprocedural respiratory failure: Secondary | ICD-10-CM | POA: Diagnosis not present

## 2016-08-01 DIAGNOSIS — L97219 Non-pressure chronic ulcer of right calf with unspecified severity: Secondary | ICD-10-CM | POA: Diagnosis not present

## 2016-08-01 DIAGNOSIS — K759 Inflammatory liver disease, unspecified: Secondary | ICD-10-CM | POA: Diagnosis present

## 2016-08-01 DIAGNOSIS — R031 Nonspecific low blood-pressure reading: Secondary | ICD-10-CM | POA: Diagnosis not present

## 2016-08-01 DIAGNOSIS — I739 Peripheral vascular disease, unspecified: Secondary | ICD-10-CM | POA: Diagnosis not present

## 2016-08-01 DIAGNOSIS — E876 Hypokalemia: Secondary | ICD-10-CM | POA: Diagnosis not present

## 2016-08-01 DIAGNOSIS — I482 Chronic atrial fibrillation: Secondary | ICD-10-CM | POA: Diagnosis not present

## 2016-08-01 DIAGNOSIS — Y92410 Unspecified street and highway as the place of occurrence of the external cause: Secondary | ICD-10-CM | POA: Diagnosis not present

## 2016-08-01 DIAGNOSIS — J9600 Acute respiratory failure, unspecified whether with hypoxia or hypercapnia: Secondary | ICD-10-CM | POA: Diagnosis not present

## 2016-08-01 DIAGNOSIS — Z560 Unemployment, unspecified: Secondary | ICD-10-CM

## 2016-08-01 DIAGNOSIS — Z9081 Acquired absence of spleen: Secondary | ICD-10-CM

## 2016-08-01 DIAGNOSIS — R578 Other shock: Secondary | ICD-10-CM | POA: Diagnosis not present

## 2016-08-01 DIAGNOSIS — S36039A Unspecified laceration of spleen, initial encounter: Secondary | ICD-10-CM | POA: Diagnosis not present

## 2016-08-01 DIAGNOSIS — S3600XA Unspecified injury of spleen, initial encounter: Secondary | ICD-10-CM | POA: Diagnosis not present

## 2016-08-01 DIAGNOSIS — I872 Venous insufficiency (chronic) (peripheral): Secondary | ICD-10-CM | POA: Diagnosis not present

## 2016-08-01 DIAGNOSIS — D72829 Elevated white blood cell count, unspecified: Secondary | ICD-10-CM

## 2016-08-01 DIAGNOSIS — S36032A Major laceration of spleen, initial encounter: Secondary | ICD-10-CM | POA: Diagnosis not present

## 2016-08-01 DIAGNOSIS — M545 Low back pain: Secondary | ICD-10-CM | POA: Diagnosis not present

## 2016-08-01 DIAGNOSIS — R05 Cough: Secondary | ICD-10-CM | POA: Diagnosis not present

## 2016-08-01 DIAGNOSIS — S3991XA Unspecified injury of abdomen, initial encounter: Secondary | ICD-10-CM | POA: Diagnosis not present

## 2016-08-01 DIAGNOSIS — S3993XA Unspecified injury of pelvis, initial encounter: Secondary | ICD-10-CM | POA: Diagnosis not present

## 2016-08-01 DIAGNOSIS — S2242XA Multiple fractures of ribs, left side, initial encounter for closed fracture: Secondary | ICD-10-CM | POA: Diagnosis not present

## 2016-08-01 DIAGNOSIS — Z79899 Other long term (current) drug therapy: Secondary | ICD-10-CM | POA: Diagnosis not present

## 2016-08-01 DIAGNOSIS — S3681XA Injury of peritoneum, initial encounter: Secondary | ICD-10-CM | POA: Diagnosis not present

## 2016-08-01 DIAGNOSIS — F419 Anxiety disorder, unspecified: Secondary | ICD-10-CM | POA: Diagnosis not present

## 2016-08-01 DIAGNOSIS — R058 Other specified cough: Secondary | ICD-10-CM

## 2016-08-01 DIAGNOSIS — R7989 Other specified abnormal findings of blood chemistry: Secondary | ICD-10-CM | POA: Diagnosis not present

## 2016-08-01 DIAGNOSIS — R9439 Abnormal result of other cardiovascular function study: Secondary | ICD-10-CM | POA: Diagnosis not present

## 2016-08-01 DIAGNOSIS — T794XXA Traumatic shock, initial encounter: Secondary | ICD-10-CM | POA: Diagnosis not present

## 2016-08-01 DIAGNOSIS — K219 Gastro-esophageal reflux disease without esophagitis: Secondary | ICD-10-CM | POA: Diagnosis not present

## 2016-08-01 DIAGNOSIS — R102 Pelvic and perineal pain: Secondary | ICD-10-CM | POA: Diagnosis not present

## 2016-08-01 DIAGNOSIS — Z9911 Dependence on respirator [ventilator] status: Secondary | ICD-10-CM | POA: Diagnosis not present

## 2016-08-01 HISTORY — PX: SPLENECTOMY, TOTAL: SHX788

## 2016-08-01 HISTORY — PX: LAPAROTOMY: SHX154

## 2016-08-01 LAB — BASIC METABOLIC PANEL
Anion gap: 9 (ref 5–15)
BUN: 10 mg/dL (ref 6–20)
CALCIUM: 6.8 mg/dL — AB (ref 8.9–10.3)
CO2: 19 mmol/L — AB (ref 22–32)
CREATININE: 0.99 mg/dL (ref 0.61–1.24)
Chloride: 109 mmol/L (ref 101–111)
GLUCOSE: 289 mg/dL — AB (ref 65–99)
Potassium: 4 mmol/L (ref 3.5–5.1)
Sodium: 137 mmol/L (ref 135–145)

## 2016-08-01 LAB — PREPARE RBC (CROSSMATCH)

## 2016-08-01 LAB — POCT I-STAT 7, (LYTES, BLD GAS, ICA,H+H)
ACID-BASE DEFICIT: 2 mmol/L (ref 0.0–2.0)
ACID-BASE DEFICIT: 7 mmol/L — AB (ref 0.0–2.0)
Acid-base deficit: 8 mmol/L — ABNORMAL HIGH (ref 0.0–2.0)
BICARBONATE: 18.8 mmol/L — AB (ref 20.0–28.0)
Bicarbonate: 20.8 mmol/L (ref 20.0–28.0)
Bicarbonate: 23.5 mmol/L (ref 20.0–28.0)
CALCIUM ION: 0.72 mmol/L — AB (ref 1.15–1.40)
CALCIUM ION: 0.85 mmol/L — AB (ref 1.15–1.40)
Calcium, Ion: 1.02 mmol/L — ABNORMAL LOW (ref 1.15–1.40)
HCT: 26 % — ABNORMAL LOW (ref 39.0–52.0)
HCT: 26 % — ABNORMAL LOW (ref 39.0–52.0)
HEMATOCRIT: 20 % — AB (ref 39.0–52.0)
HEMOGLOBIN: 8.8 g/dL — AB (ref 13.0–17.0)
HEMOGLOBIN: 8.8 g/dL — AB (ref 13.0–17.0)
Hemoglobin: 6.8 g/dL — CL (ref 13.0–17.0)
O2 SAT: 100 %
O2 SAT: 100 %
O2 SAT: 100 %
PCO2 ART: 44.3 mmHg (ref 32.0–48.0)
PCO2 ART: 54.8 mmHg — AB (ref 32.0–48.0)
PH ART: 7.187 — AB (ref 7.350–7.450)
PH ART: 7.372 (ref 7.350–7.450)
PO2 ART: 509 mmHg — AB (ref 83.0–108.0)
POTASSIUM: 4 mmol/L (ref 3.5–5.1)
POTASSIUM: 4.3 mmol/L (ref 3.5–5.1)
Potassium: 3.9 mmol/L (ref 3.5–5.1)
Sodium: 144 mmol/L (ref 135–145)
Sodium: 146 mmol/L — ABNORMAL HIGH (ref 135–145)
Sodium: 147 mmol/L — ABNORMAL HIGH (ref 135–145)
TCO2: 22 mmol/L (ref 0–100)
TCO2: 20 mmol/L (ref 0–100)
TCO2: 25 mmol/L (ref 0–100)
pCO2 arterial: 40.4 mmHg (ref 32.0–48.0)
pH, Arterial: 7.236 — ABNORMAL LOW (ref 7.350–7.450)
pO2, Arterial: 509 mmHg — ABNORMAL HIGH (ref 83.0–108.0)
pO2, Arterial: 518 mmHg — ABNORMAL HIGH (ref 83.0–108.0)

## 2016-08-01 LAB — COMPREHENSIVE METABOLIC PANEL
ALT: 15 U/L — AB (ref 17–63)
AST: 23 U/L (ref 15–41)
Albumin: 3.4 g/dL — ABNORMAL LOW (ref 3.5–5.0)
Alkaline Phosphatase: 62 U/L (ref 38–126)
Anion gap: 14 (ref 5–15)
BUN: 11 mg/dL (ref 6–20)
CHLORIDE: 105 mmol/L (ref 101–111)
CO2: 21 mmol/L — ABNORMAL LOW (ref 22–32)
CREATININE: 1.14 mg/dL (ref 0.61–1.24)
Calcium: 8.7 mg/dL — ABNORMAL LOW (ref 8.9–10.3)
GFR calc Af Amer: >60 mL/min (ref >60)
GFR calc non Af Amer: >60 mL/min (ref >60)
Glucose, Bld: 223 mg/dL — ABNORMAL HIGH (ref 65–99)
Potassium: 3.3 mmol/L — ABNORMAL LOW (ref 3.5–5.1)
SODIUM: 140 mmol/L (ref 135–145)
Total Bilirubin: 0.6 mg/dL (ref 0.3–1.2)
Total Protein: 6.4 g/dL — ABNORMAL LOW (ref 6.5–8.1)

## 2016-08-01 LAB — POCT I-STAT 3, ART BLOOD GAS (G3+)
ACID-BASE DEFICIT: 3 mmol/L — AB (ref 0.0–2.0)
Bicarbonate: 21.7 mmol/L (ref 20.0–28.0)
O2 SAT: 98 %
TCO2: 23 mmol/L (ref 0–100)
pCO2 arterial: 37.3 mmHg (ref 32.0–48.0)
pH, Arterial: 7.369 (ref 7.350–7.450)
pO2, Arterial: 98 mmHg (ref 83.0–108.0)

## 2016-08-01 LAB — CBC
HCT: 35.3 % — ABNORMAL LOW (ref 39.0–52.0)
HCT: 24 % — ABNORMAL LOW (ref 39.0–52.0)
Hemoglobin: 11.2 g/dL — ABNORMAL LOW (ref 13.0–17.0)
Hemoglobin: 7.9 g/dL — ABNORMAL LOW (ref 13.0–17.0)
MCH: 27 pg (ref 26.0–34.0)
MCH: 27.8 pg (ref 26.0–34.0)
MCHC: 31.7 g/dL (ref 30.0–36.0)
MCHC: 32.9 g/dL (ref 30.0–36.0)
MCV: 85.1 fL (ref 78.0–100.0)
MCV: 84.5 fL (ref 78.0–100.0)
PLATELETS: 77 10*3/uL — AB (ref 150–400)
Platelets: 185 10*3/uL (ref 150–400)
RBC: 4.15 MIL/uL — AB (ref 4.22–5.81)
RBC: 2.84 MIL/uL — ABNORMAL LOW (ref 4.22–5.81)
RDW: 15.4 % (ref 11.5–15.5)
RDW: 15.1 % (ref 11.5–15.5)
WBC: 9.5 10*3/uL (ref 4.0–10.5)
WBC: 10.3 10*3/uL (ref 4.0–10.5)

## 2016-08-01 LAB — MRSA PCR SCREENING: MRSA BY PCR: NEGATIVE

## 2016-08-01 LAB — I-STAT CHEM 8, ED
BUN: 13 mg/dL (ref 6–20)
Calcium, Ion: 1.08 mmol/L — ABNORMAL LOW (ref 1.15–1.40)
Chloride: 104 mmol/L (ref 101–111)
Creatinine, Ser: 1.2 mg/dL (ref 0.61–1.24)
Glucose, Bld: 221 mg/dL — ABNORMAL HIGH (ref 65–99)
HEMATOCRIT: 36 % — AB (ref 39.0–52.0)
HEMOGLOBIN: 12.2 g/dL — AB (ref 13.0–17.0)
POTASSIUM: 3.2 mmol/L — AB (ref 3.5–5.1)
Sodium: 141 mmol/L (ref 135–145)
TCO2: 22 mmol/L (ref 0–100)

## 2016-08-01 LAB — PROTIME-INR
INR: 1.18
INR: 1.5
PROTHROMBIN TIME: 15 s (ref 11.4–15.2)
Prothrombin Time: 18.3 seconds — ABNORMAL HIGH (ref 11.4–15.2)

## 2016-08-01 LAB — I-STAT TROPONIN, ED: TROPONIN I, POC: 0.01 ng/mL (ref 0.00–0.08)

## 2016-08-01 LAB — ABO/RH: ABO/RH(D): O POS

## 2016-08-01 LAB — CBG MONITORING, ED: Glucose-Capillary: 181 mg/dL — ABNORMAL HIGH (ref 65–99)

## 2016-08-01 LAB — I-STAT CG4 LACTIC ACID, ED: LACTIC ACID, VENOUS: 4.39 mmol/L — AB (ref 0.5–1.9)

## 2016-08-01 LAB — TROPONIN I: Troponin I: 0.13 ng/mL (ref <0.03)

## 2016-08-01 LAB — ETHANOL: Alcohol, Ethyl (B): <5 mg/dL (ref <5)

## 2016-08-01 LAB — TRIGLYCERIDES: TRIGLYCERIDES: 92 mg/dL (ref <150)

## 2016-08-01 LAB — CDS SEROLOGY

## 2016-08-01 SURGERY — SPLENECTOMY
Anesthesia: General | Site: Abdomen

## 2016-08-01 SURGERY — LAPAROTOMY, EXPLORATORY
Anesthesia: General | Site: Abdomen

## 2016-08-01 MED ORDER — SODIUM CHLORIDE 0.9 % IV SOLN
INTRAVENOUS | Status: DC | PRN
  Administered 2016-08-01: 15:00:00 via INTRAVENOUS

## 2016-08-01 MED ORDER — FENTANYL CITRATE (PF) 100 MCG/2ML IJ SOLN
INTRAMUSCULAR | Status: AC
  Filled 2016-08-01: qty 4

## 2016-08-01 MED ORDER — SODIUM CHLORIDE 0.9 % IV SOLN
Freq: Once | INTRAVENOUS | Status: AC
  Administered 2016-08-01: 14:00:00 via INTRAVENOUS

## 2016-08-01 MED ORDER — DIPHENHYDRAMINE HCL 50 MG/ML IJ SOLN: 12.5000 mg | Freq: Four times a day (QID) | INTRAMUSCULAR | Status: DC | PRN

## 2016-08-01 MED ORDER — 0.9 % SODIUM CHLORIDE (POUR BTL) OPTIME
TOPICAL | Status: DC | PRN
  Administered 2016-08-01 (×5): 1000 mL

## 2016-08-01 MED ORDER — ONDANSETRON HCL 4 MG/2ML IJ SOLN
INTRAMUSCULAR | Status: DC | PRN
  Administered 2016-08-01: 4 mg via INTRAVENOUS

## 2016-08-01 MED ORDER — PROPOFOL 1000 MG/100ML IV EMUL
INTRAVENOUS | Status: AC
  Filled 2016-08-01: qty 100

## 2016-08-01 MED ORDER — SODIUM CHLORIDE 0.9 % IV SOLN
Freq: Once | INTRAVENOUS | Status: AC
  Administered 2016-08-01: 18:00:00 via INTRAVENOUS

## 2016-08-01 MED ORDER — PROPOFOL 500 MG/50ML IV EMUL
INTRAVENOUS | Status: DC | PRN
  Administered 2016-08-01: 50 ug/kg/min via INTRAVENOUS

## 2016-08-01 MED ORDER — LABETALOL HCL 5 MG/ML IV SOLN
10.0000 mg | Freq: Once | INTRAVENOUS | Status: AC
  Administered 2016-08-01: 10 mg via INTRAVENOUS
  Filled 2016-08-01: qty 4

## 2016-08-01 MED ORDER — FENTANYL CITRATE (PF) 250 MCG/5ML IJ SOLN
INTRAMUSCULAR | Status: DC | PRN
  Administered 2016-08-01 (×3): 100 ug via INTRAVENOUS

## 2016-08-01 MED ORDER — ETOMIDATE 2 MG/ML IV SOLN
INTRAVENOUS | Status: DC | PRN
  Administered 2016-08-01: 20 mg via INTRAVENOUS

## 2016-08-01 MED ORDER — PHENYLEPHRINE HCL 10 MG/ML IJ SOLN
INTRAMUSCULAR | Status: DC | PRN
  Administered 2016-08-01: 200 ug via INTRAVENOUS

## 2016-08-01 MED ORDER — SUCCINYLCHOLINE CHLORIDE 20 MG/ML IJ SOLN
INTRAMUSCULAR | Status: DC | PRN
  Administered 2016-08-01: 140 mg via INTRAVENOUS

## 2016-08-01 MED ORDER — PANTOPRAZOLE SODIUM 40 MG IV SOLR
40.0000 mg | Freq: Every day | INTRAVENOUS | Status: DC
  Administered 2016-08-02 – 2016-08-03 (×2): 40 mg via INTRAVENOUS
  Filled 2016-08-01 (×2): qty 40

## 2016-08-01 MED ORDER — DIPHENHYDRAMINE HCL 12.5 MG/5ML PO ELIX: 12.5000 mg | ORAL_SOLUTION | Freq: Four times a day (QID) | ORAL | Status: DC | PRN

## 2016-08-01 MED ORDER — NALOXONE HCL 0.4 MG/ML IJ SOLN
INTRAMUSCULAR | Status: AC
  Filled 2016-08-01: qty 1

## 2016-08-01 MED ORDER — HYDROMORPHONE 1 MG/ML IV SOLN
INTRAVENOUS | Status: AC
  Filled 2016-08-01: qty 25

## 2016-08-01 MED ORDER — SODIUM CHLORIDE 0.9 % IV SOLN
Freq: Once | INTRAVENOUS | Status: AC
  Administered 2016-08-01: 13:00:00 via INTRAVENOUS

## 2016-08-01 MED ORDER — MIDAZOLAM HCL 2 MG/2ML IJ SOLN
INTRAMUSCULAR | Status: AC
  Filled 2016-08-01: qty 2

## 2016-08-01 MED ORDER — ETOMIDATE 2 MG/ML IV SOLN
INTRAVENOUS | Status: DC | PRN
  Administered 2016-08-01: 16 mg via INTRAVENOUS

## 2016-08-01 MED ORDER — PROPOFOL 1000 MG/100ML IV EMUL
5.0000 ug/kg/min | INTRAVENOUS | Status: DC
Start: 2016-08-01 — End: 2016-08-03
  Administered 2016-08-01: 50 ug/kg/min via INTRAVENOUS
  Administered 2016-08-02 (×2): 60 ug/kg/min via INTRAVENOUS
  Filled 2016-08-01 (×3): qty 100

## 2016-08-01 MED ORDER — SODIUM CHLORIDE 0.9 % IV BOLUS (SEPSIS)
1000.0000 mL | Freq: Once | INTRAVENOUS | Status: AC
  Administered 2016-08-01: 1000 mL via INTRAVENOUS

## 2016-08-01 MED ORDER — ONDANSETRON HCL 4 MG/2ML IJ SOLN: 4.0000 mg | Freq: Four times a day (QID) | INTRAMUSCULAR | Status: DC | PRN

## 2016-08-01 MED ORDER — SUGAMMADEX SODIUM 200 MG/2ML IV SOLN
INTRAVENOUS | Status: DC | PRN
  Administered 2016-08-01: 200 mg via INTRAVENOUS

## 2016-08-01 MED ORDER — MORPHINE SULFATE (PF) 4 MG/ML IV SOLN
INTRAVENOUS | Status: AC
  Filled 2016-08-01: qty 1

## 2016-08-01 MED ORDER — KCL IN DEXTROSE-NACL 20-5-0.45 MEQ/L-%-% IV SOLN
INTRAVENOUS | Status: AC
  Filled 2016-08-01: qty 1000

## 2016-08-01 MED ORDER — ALBUMIN HUMAN 5 % IV SOLN
INTRAVENOUS | Status: DC | PRN
  Administered 2016-08-01: 15:00:00 via INTRAVENOUS

## 2016-08-01 MED ORDER — ROCURONIUM BROMIDE 100 MG/10ML IV SOLN
INTRAVENOUS | Status: DC | PRN
  Administered 2016-08-01: 50 mg via INTRAVENOUS

## 2016-08-01 MED ORDER — HYDROMORPHONE 1 MG/ML IV SOLN
INTRAVENOUS | Status: DC
  Administered 2016-08-01: 16:00:00 via INTRAVENOUS

## 2016-08-01 MED ORDER — HYDROMORPHONE HCL 1 MG/ML IJ SOLN
INTRAMUSCULAR | Status: AC
  Filled 2016-08-01: qty 1

## 2016-08-01 MED ORDER — FENTANYL CITRATE (PF) 100 MCG/2ML IJ SOLN
INTRAMUSCULAR | Status: AC
  Filled 2016-08-01: qty 2

## 2016-08-01 MED ORDER — FENTANYL CITRATE (PF) 100 MCG/2ML IJ SOLN
INTRAMUSCULAR | Status: DC | PRN
  Administered 2016-08-01: 50 ug via INTRAVENOUS
  Administered 2016-08-01: 25 ug via INTRAVENOUS
  Administered 2016-08-01: 100 ug via INTRAVENOUS
  Administered 2016-08-01 (×2): 50 ug via INTRAVENOUS
  Administered 2016-08-01: 25 ug via INTRAVENOUS

## 2016-08-01 MED ORDER — SODIUM CHLORIDE 0.9 % IV SOLN
INTRAVENOUS | Status: DC | PRN
  Administered 2016-08-01 (×2): via INTRAVENOUS

## 2016-08-01 MED ORDER — LIDOCAINE 2% (20 MG/ML) 5 ML SYRINGE
INTRAMUSCULAR | Status: DC | PRN
Start: 2016-08-01 — End: 2016-08-01
  Administered 2016-08-01: 100 mg via INTRAVENOUS

## 2016-08-01 MED ORDER — CEFAZOLIN SODIUM 1 G IJ SOLR
INTRAMUSCULAR | Status: DC | PRN
  Administered 2016-08-01: 2 g via INTRAMUSCULAR

## 2016-08-01 MED ORDER — SODIUM CHLORIDE 0.9 % IV SOLN
25.0000 ug/h | INTRAVENOUS | Status: DC
  Administered 2016-08-01: 50 ug/h via INTRAVENOUS
  Filled 2016-08-01: qty 50

## 2016-08-01 MED ORDER — SUCCINYLCHOLINE CHLORIDE 200 MG/10ML IV SOSY
PREFILLED_SYRINGE | INTRAVENOUS | Status: DC | PRN
  Administered 2016-08-01: 120 mg via INTRAVENOUS

## 2016-08-01 MED ORDER — MIDAZOLAM HCL 5 MG/5ML IJ SOLN
INTRAMUSCULAR | Status: DC | PRN
  Administered 2016-08-01: 1 mg via INTRAVENOUS

## 2016-08-01 MED ORDER — MORPHINE SULFATE (PF) 2 MG/ML IV SOLN
2.0000 mg | Freq: Once | INTRAVENOUS | Status: AC
  Administered 2016-08-01: 4 mg via INTRAVENOUS

## 2016-08-01 MED ORDER — NALOXONE HCL 0.4 MG/ML IJ SOLN: 0.4000 mg | INTRAMUSCULAR | Status: DC | PRN

## 2016-08-01 MED ORDER — SODIUM BICARBONATE 8.4 % IV SOLN
INTRAVENOUS | Status: DC | PRN
  Administered 2016-08-01 (×3): 50 meq via INTRAVENOUS

## 2016-08-01 MED ORDER — PHENYLEPHRINE HCL 10 MG/ML IJ SOLN
INTRAVENOUS | Status: DC | PRN
  Administered 2016-08-01: 300 ug/min via INTRAVENOUS

## 2016-08-01 MED ORDER — KCL IN DEXTROSE-NACL 20-5-0.45 MEQ/L-%-% IV SOLN
INTRAVENOUS | Status: DC
  Administered 2016-08-01 (×3): via INTRAVENOUS

## 2016-08-01 MED ORDER — FENTANYL BOLUS VIA INFUSION
25.0000 ug | INTRAVENOUS | Status: DC | PRN
Start: 2016-08-01 — End: 2016-08-03
  Filled 2016-08-01: qty 25

## 2016-08-01 MED ORDER — 0.9 % SODIUM CHLORIDE (POUR BTL) OPTIME
TOPICAL | Status: DC | PRN
  Administered 2016-08-01: 3000 mL

## 2016-08-01 MED ORDER — CALCIUM CHLORIDE 10 % IV SOLN
INTRAVENOUS | Status: DC | PRN
  Administered 2016-08-01: 1000 mg via INTRAVENOUS

## 2016-08-01 MED ORDER — PROMETHAZINE HCL 25 MG/ML IJ SOLN: 6.2500 mg | INTRAMUSCULAR | Status: DC | PRN

## 2016-08-01 MED ORDER — PANTOPRAZOLE SODIUM 40 MG PO TBEC
40.0000 mg | DELAYED_RELEASE_TABLET | Freq: Every day | ORAL | Status: DC
  Administered 2016-08-04 – 2016-08-09 (×6): 40 mg via ORAL
  Filled 2016-08-01 (×6): qty 1

## 2016-08-01 MED ORDER — LACTATED RINGERS IV SOLN
INTRAVENOUS | Status: DC | PRN
  Administered 2016-08-01: 14:00:00 via INTRAVENOUS

## 2016-08-01 MED ORDER — HYDROMORPHONE HCL 1 MG/ML IJ SOLN
0.2500 mg | INTRAMUSCULAR | Status: DC | PRN
  Administered 2016-08-01 (×4): 0.5 mg via INTRAVENOUS

## 2016-08-01 MED ORDER — ROCURONIUM BROMIDE 10 MG/ML (PF) SYRINGE
PREFILLED_SYRINGE | INTRAVENOUS | Status: DC | PRN
  Administered 2016-08-01: 20 mg via INTRAVENOUS

## 2016-08-01 MED ORDER — NALOXONE HCL 2 MG/2ML IJ SOSY
PREFILLED_SYRINGE | INTRAMUSCULAR | Status: AC | PRN
Start: 2016-08-01 — End: 2016-08-01
  Administered 2016-08-01: 0.4 mg via INTRAVENOUS

## 2016-08-01 MED ORDER — IOPAMIDOL (ISOVUE-300) INJECTION 61%
INTRAVENOUS | Status: AC
  Administered 2016-08-01: 100 mL
  Filled 2016-08-01: qty 100

## 2016-08-01 MED ORDER — SODIUM CHLORIDE 0.9% FLUSH
9.0000 mL | INTRAVENOUS | Status: DC | PRN
Start: 2016-08-01 — End: 2016-08-02

## 2016-08-01 MED ORDER — SODIUM CHLORIDE 0.9 % IV SOLN: Freq: Once | INTRAVENOUS | Status: DC

## 2016-08-01 MED ORDER — PROPOFOL 10 MG/ML IV BOLUS
INTRAVENOUS | Status: AC
  Filled 2016-08-01: qty 20

## 2016-08-01 MED ORDER — SODIUM CHLORIDE 0.9 % IV SOLN
0.0000 ug/min | INTRAVENOUS | Status: DC
  Administered 2016-08-01: 200 ug/min via INTRAVENOUS
  Filled 2016-08-01 (×3): qty 1

## 2016-08-01 MED ORDER — SODIUM CHLORIDE 0.9 % IV SOLN
INTRAVENOUS | Status: AC | PRN
  Administered 2016-08-01: 1000 mL via INTRAVENOUS

## 2016-08-01 SURGICAL SUPPLY — 51 items
APPLIER CLIP 11 MED OPEN (CLIP) ×2
APPLIER CLIP 13 LRG OPEN (CLIP)
APR CLP LRG 13 20 CLIP (CLIP)
APR CLP MED 11 20 MLT OPN (CLIP) ×1
BLADE SURG ROTATE 9660 (MISCELLANEOUS) IMPLANT
CANISTER SUCTION 2500CC (MISCELLANEOUS) ×4 IMPLANT
CHLORAPREP W/TINT 26ML (MISCELLANEOUS) ×2 IMPLANT
CLIP APPLIE 11 MED OPEN (CLIP) ×1 IMPLANT
CLIP APPLIE 13 LRG OPEN (CLIP) IMPLANT
CONNECTOR Y ATS VAC SYSTEM (MISCELLANEOUS) ×1 IMPLANT
COVER SURGICAL LIGHT HANDLE (MISCELLANEOUS) ×2 IMPLANT
DECANTER SPIKE VIAL GLASS SM (MISCELLANEOUS) IMPLANT
DRAPE LAPAROSCOPIC ABDOMINAL (DRAPES) ×2 IMPLANT
DRAPE UTILITY XL STRL (DRAPES) ×4 IMPLANT
ELECT BLADE 6.5 EXT (BLADE) ×2 IMPLANT
ELECT REM PT RETURN 9FT ADLT (ELECTROSURGICAL) ×2
ELECTRODE REM PT RTRN 9FT ADLT (ELECTROSURGICAL) ×1 IMPLANT
GAUZE SPONGE 4X4 12PLY STRL (GAUZE/BANDAGES/DRESSINGS) ×1 IMPLANT
GLOVE BIO SURGEON STRL SZ8 (GLOVE) ×2 IMPLANT
GLOVE BIOGEL PI IND STRL 7.0 (GLOVE) IMPLANT
GLOVE BIOGEL PI IND STRL 8 (GLOVE) ×1 IMPLANT
GLOVE BIOGEL PI INDICATOR 7.0 (GLOVE) ×2
GLOVE BIOGEL PI INDICATOR 8 (GLOVE) ×2
GLOVE ECLIPSE 7.5 STRL STRAW (GLOVE) ×2 IMPLANT
GOWN STRL REUS W/ TWL LRG LVL3 (GOWN DISPOSABLE) ×2 IMPLANT
GOWN STRL REUS W/ TWL XL LVL3 (GOWN DISPOSABLE) ×1 IMPLANT
GOWN STRL REUS W/TWL LRG LVL3 (GOWN DISPOSABLE) ×4
GOWN STRL REUS W/TWL XL LVL3 (GOWN DISPOSABLE) ×2
HEMOSTAT SURGICEL 2X14 (HEMOSTASIS) IMPLANT
KIT BASIN OR (CUSTOM PROCEDURE TRAY) ×2 IMPLANT
KIT ROOM TURNOVER OR (KITS) ×2 IMPLANT
NS IRRIG 1000ML POUR BTL (IV SOLUTION) ×4 IMPLANT
PACK GENERAL/GYN (CUSTOM PROCEDURE TRAY) ×2 IMPLANT
PAD ARMBOARD 7.5X6 YLW CONV (MISCELLANEOUS) ×4 IMPLANT
SPECIMEN JAR X LARGE (MISCELLANEOUS) ×2 IMPLANT
SPONGE INTESTINAL PEANUT (DISPOSABLE) IMPLANT
SPONGE LAP 18X18 X RAY DECT (DISPOSABLE) ×5 IMPLANT
SPONGE SURGIFOAM ABS GEL 100 (HEMOSTASIS) IMPLANT
STAPLER SKIN 35 WIDE (STAPLE) ×1 IMPLANT
STAPLER VISISTAT 35W (STAPLE) ×2 IMPLANT
SUCTION POOLE TIP (SUCTIONS) IMPLANT
SUT PDS AB 1 TP1 96 (SUTURE) ×6 IMPLANT
SUT SILK 0 TIES 10X30 (SUTURE) ×2 IMPLANT
SUT SILK 2 0 SH (SUTURE) ×4 IMPLANT
SUT SILK 2 0 SH CR/8 (SUTURE) ×2 IMPLANT
SUT SILK 2 0 TIES 10X30 (SUTURE) ×2 IMPLANT
SUT SILK 2 0SH CR/8 30 (SUTURE) ×2 IMPLANT
TOWEL OR 17X24 6PK STRL BLUE (TOWEL DISPOSABLE) ×2 IMPLANT
TOWEL OR 17X26 10 PK STRL BLUE (TOWEL DISPOSABLE) ×2 IMPLANT
TRAY FOLEY CATH 14FRSI W/METER (CATHETERS) IMPLANT
WATER STERILE IRR 1000ML POUR (IV SOLUTION) ×2 IMPLANT

## 2016-08-01 SURGICAL SUPPLY — 47 items
BLADE SURG ROTATE 9660 (MISCELLANEOUS) IMPLANT
CANISTER SUCTION 2500CC (MISCELLANEOUS) ×7 IMPLANT
CHLORAPREP W/TINT 26ML (MISCELLANEOUS) ×1 IMPLANT
CLIP TI LARGE 6 (CLIP) ×2 IMPLANT
COVER SURGICAL LIGHT HANDLE (MISCELLANEOUS) ×3 IMPLANT
DRAPE LAPAROSCOPIC ABDOMINAL (DRAPES) ×3 IMPLANT
DRAPE WARM FLUID 44X44 (DRAPE) ×3 IMPLANT
DRSG OPSITE POSTOP 4X10 (GAUZE/BANDAGES/DRESSINGS) ×2 IMPLANT
DRSG OPSITE POSTOP 4X8 (GAUZE/BANDAGES/DRESSINGS) ×2 IMPLANT
ELECT BLADE 6.5 EXT (BLADE) IMPLANT
ELECT CAUTERY BLADE 6.4 (BLADE) ×3 IMPLANT
ELECT REM PT RETURN 9FT ADLT (ELECTROSURGICAL) ×3
ELECTRODE REM PT RTRN 9FT ADLT (ELECTROSURGICAL) ×1 IMPLANT
GLOVE BIO SURGEON STRL SZ 6 (GLOVE) ×2 IMPLANT
GLOVE BIO SURGEON STRL SZ 6.5 (GLOVE) ×1 IMPLANT
GLOVE BIO SURGEON STRL SZ8 (GLOVE) ×3 IMPLANT
GLOVE BIO SURGEONS STRL SZ 6.5 (GLOVE) ×1
GLOVE BIOGEL PI IND STRL 6.5 (GLOVE) IMPLANT
GLOVE BIOGEL PI IND STRL 8 (GLOVE) ×1 IMPLANT
GLOVE BIOGEL PI INDICATOR 6.5 (GLOVE) ×2
GLOVE BIOGEL PI INDICATOR 8 (GLOVE) ×2
GLOVE SURG SS PI 7.5 STRL IVOR (GLOVE) ×6 IMPLANT
GOWN STRL REUS W/ TWL LRG LVL3 (GOWN DISPOSABLE) ×1 IMPLANT
GOWN STRL REUS W/ TWL XL LVL3 (GOWN DISPOSABLE) ×1 IMPLANT
GOWN STRL REUS W/TWL LRG LVL3 (GOWN DISPOSABLE) ×3
GOWN STRL REUS W/TWL XL LVL3 (GOWN DISPOSABLE) ×3
KIT BASIN OR (CUSTOM PROCEDURE TRAY) ×3 IMPLANT
KIT ROOM TURNOVER OR (KITS) ×3 IMPLANT
LIGASURE IMPACT 36 18CM CVD LR (INSTRUMENTS) ×2 IMPLANT
NS IRRIG 1000ML POUR BTL (IV SOLUTION) ×8 IMPLANT
PACK GENERAL/GYN (CUSTOM PROCEDURE TRAY) ×3 IMPLANT
PAD ARMBOARD 7.5X6 YLW CONV (MISCELLANEOUS) ×3 IMPLANT
SOLUTION BETADINE 4OZ (MISCELLANEOUS) ×2 IMPLANT
SPECIMEN JAR LARGE (MISCELLANEOUS) IMPLANT
SPONGE GAUZE 4X4 12PLY STER LF (GAUZE/BANDAGES/DRESSINGS) ×2 IMPLANT
SPONGE LAP 18X18 X RAY DECT (DISPOSABLE) ×8 IMPLANT
STAPLER VISISTAT 35W (STAPLE) ×3 IMPLANT
SUCTION POOLE TIP (SUCTIONS) ×5 IMPLANT
SUT PDS AB 1 TP1 96 (SUTURE) ×6 IMPLANT
SUT SILK 2 0 SH CR/8 (SUTURE) ×5 IMPLANT
SUT SILK 2 0 TIES 10X30 (SUTURE) ×3 IMPLANT
SUT SILK 3 0 SH CR/8 (SUTURE) ×5 IMPLANT
SUT SILK 3 0 TIES 10X30 (SUTURE) ×3 IMPLANT
TAPE CLOTH SURG 4X10 WHT LF (GAUZE/BANDAGES/DRESSINGS) ×2 IMPLANT
TOWEL OR 17X26 10 PK STRL BLUE (TOWEL DISPOSABLE) ×3 IMPLANT
TRAY FOLEY CATH 16FRSI W/METER (SET/KITS/TRAYS/PACK) IMPLANT
YANKAUER SUCT BULB TIP NO VENT (SUCTIONS) IMPLANT

## 2016-08-01 NOTE — Procedures (Signed)
Central Venous Catheter Insertion Procedure Note Anna GenreCameron Culbreth [id number] 06/04/1951  Procedure: Insertion of Central Venous Catheter Indications: Drug and/or fluid administration  Procedure Details Consent: Unable to obtain consent because of heroin and alcohol intoxication. Time Out: Verified patient identification, verified procedure, site/side was marked, verified correct patient position, special equipment/implants available, medications/allergies/relevent history reviewed, required imaging and test results available.  Performed  Maximum sterile technique was used including antiseptics, cap, gloves, gown, hand hygiene, mask and sheet. Skin prep: Chlorhexidine; local anesthetic administered A antimicrobial bonded/coated triple lumen catheter was placed in the left subclavian vein using the Seldinger technique. (Initial R femoral try unsuccessful.)  Evaluation Blood flow good Complications: No apparent complications Patient did tolerate procedure well. Chest X-ray ordered to verify placement.  CXR: reviewed.  Kano Heckmann E 08/01/2016, 1:28 PM

## 2016-08-01 NOTE — Anesthesia Postprocedure Evaluation (Deleted)
Anesthesia Post Note  Patient: Bryan Powell GenreCameron Bahner  Procedure(s) Performed: Procedure(s) (LRB): EXPLORATORY LAPAROTOMY (N/A)  Patient location during evaluation: PACU Anesthesia Type: General Level of consciousness: awake and alert Pain management: pain level controlled Vital Signs Assessment: post-procedure vital signs reviewed and stable Respiratory status: spontaneous breathing, nonlabored ventilation, respiratory function stable and patient connected to nasal cannula oxygen Cardiovascular status: blood pressure returned to baseline and stable Postop Assessment: no signs of nausea or vomiting Anesthetic complications: no       Last Vitals:  Vitals:   08/01/16 2200 08/01/16 2215  BP: 129/84 128/84  Pulse: 97 (!) 101  Resp: 18 18  Temp: 36.3 C     Last Pain:  Vitals:   08/01/16 2200  TempSrc: Axillary  PainSc:                  Laymon Stockert DAVID

## 2016-08-01 NOTE — Anesthesia Postprocedure Evaluation (Addendum)
Anesthesia Post Note  Patient: Anna GenreCameron Minnis  Procedure(s) Performed: Procedure(s) (LRB): SPLENECTOMY (N/A)  Patient location during evaluation: PACU Anesthesia Type: General Level of consciousness: awake and sedated Pain management: pain level controlled Vital Signs Assessment: post-procedure vital signs reviewed and stable Respiratory status: spontaneous breathing, nonlabored ventilation, respiratory function stable and patient connected to nasal cannula oxygen Cardiovascular status: blood pressure returned to baseline and stable Postop Assessment: no signs of nausea or vomiting Anesthetic complications: no       Last Vitals:  Vitals:   08/01/16 1630 08/01/16 1640  BP:  113/76  Pulse: (!) 114 (!) 114  Resp:    Temp:  36.3 C    Last Pain:  Vitals:   08/01/16 1634  TempSrc:   PainSc: 10-Worst pain ever                 Graceann Boileau,JAMES TERRILL

## 2016-08-01 NOTE — ED Notes (Signed)
Pt admits to using Heroine today, unspecified time

## 2016-08-01 NOTE — ED Notes (Signed)
Pts wife called per pts request, message left for the pts wife with this RNs contact information, pt aware
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
## 2016-08-01 NOTE — ED Notes (Signed)
This Rn accompanied pt to CT scanner

## 2016-08-01 NOTE — Progress Notes (Signed)
   Per EMS, family contacted/in transit.  Family not yet arrived.  ED admissions to page on-call chaplain when family arrives.  - Rev. Chaplain Kipp Broodnthony Theo Reither MDiv ThM

## 2016-08-01 NOTE — Progress Notes (Signed)
CRITICAL VALUE ALERT  Critical value received:  Troponin 0.13  Date of notification:  08-01-16  Time of notification:  1800  Critical value read back: Yes  Nurse who received alert:  Ripley FraiseHarper, Sonji Starkes D  MD notified (1st page):  Fredricka Bonineonnor, C   Time of first page:  Present   MD notified (2nd page):  Time of second page:  Responding MD:  Fredricka Bonineonnor. C  Time MD responded:  Present

## 2016-08-01 NOTE — Anesthesia Preprocedure Evaluation (Signed)
Anesthesia Evaluation  Patient identified by MRN, date of birth, ID band Patient unresponsive    Reviewed: Unable to perform ROS - Chart review onlyPreop documentation limited or incomplete due to emergent nature of procedure.  Airway Mallampati: Intubated       Dental   Pulmonary former smoker,    Pulmonary exam normal        Cardiovascular Normal cardiovascular exam     Neuro/Psych Anxiety Depression    GI/Hepatic (+) Hepatitis -, C  Endo/Other    Renal/GU      Musculoskeletal   Abdominal   Peds  Hematology   Anesthesia Other Findings   Reproductive/Obstetrics                             Anesthesia Physical Anesthesia Plan  ASA: IV and emergent  Anesthesia Plan: General   Post-op Pain Management:    Induction: Intravenous  Airway Management Planned: Oral ETT  Additional Equipment:   Intra-op Plan:   Post-operative Plan: Post-operative intubation/ventilation  Informed Consent: I have reviewed the patients History and Physical, chart, labs and discussed the procedure including the risks, benefits and alternatives for the proposed anesthesia with the patient or authorized representative who has indicated his/her understanding and acceptance.     Plan Discussed with: CRNA and Surgeon  Anesthesia Plan Comments:         Anesthesia Quick Evaluation

## 2016-08-01 NOTE — Progress Notes (Signed)
~  2300: MD on floor to assess pt. Discussed BP. MD stated she was ok with BP being 100-120/60-70. If SBP>120, give labetalol.  Will continue to monitor.

## 2016-08-01 NOTE — Anesthesia Procedure Notes (Signed)
Procedure Name: Intubation Date/Time: 08/01/2016 8:06 PM Performed by: Molli HazardGORDON, Bryan Powell Pre-anesthesia Checklist: Patient identified, Emergency Drugs available, Suction available and Patient being monitored Patient Re-evaluated:Patient Re-evaluated prior to inductionOxygen Delivery Method: Circle system utilized Preoxygenation: Pre-oxygenation with 100% oxygen Intubation Type: IV induction, Rapid sequence and Cricoid Pressure applied Laryngoscope Size: Miller and 2 Grade View: Grade I Tube type: Subglottic suction tube Tube size: 7.5 mm Number of attempts: 1 Airway Equipment and Method: Stylet Placement Confirmation: ETT inserted through vocal cords under direct vision,  positive ETCO2 and breath sounds checked- equal and bilateral Secured at: 24 cm Tube secured with: Tape Dental Injury: Teeth and Oropharynx as per pre-operative assessment
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
## 2016-08-01 NOTE — ED Notes (Signed)
Janee Mornhompson, MD spoke with pts wife, wife informed by MD that pt is going to OR, pt spoke with wife

## 2016-08-01 NOTE — Progress Notes (Signed)
1715- Patient arrived on unit hypotensive. Alert and oriented but complaining of 10/10 pain. Patient placed in reverse trendelenburg and MD paged. 1L bolus ordered and MD on way to unit.   1730- Patient's blood pressure now in the 50-40s systolic. 2 units of blood ordered. Will give emergently.   1800- Patient responding to volume. Systolic BP in the 100s.   1830- Blood and bolus complete. Blood pressure dropping. MD paged. 1L bolus started and Neo ordered. Right side of abdomen distended but soft.

## 2016-08-01 NOTE — ED Notes (Signed)
Janee Mornhompson, MD gives verbal order to use central line prior to chest xray confirmation
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
## 2016-08-01 NOTE — Transfer of Care (Signed)
Immediate Anesthesia Transfer of Care Note  Patient: Bryan Powell  Procedure(s) Performed: Procedure(s): EXPLORATORY LAPAROTOMY (N/A)  Patient Location: NICU  Anesthesia Type:General  Level of Consciousness: Patient remains intubated per anesthesia plan  Airway & Oxygen Therapy: Patient placed on Ventilator (see vital sign flow sheet for setting)  Post-op Assessment: Report given to RN and Post -op Vital signs reviewed and stable  Post vital signs: Reviewed and stable  Last Vitals:  Vitals:   08/01/16 1945 08/01/16 2200  BP:    Pulse: (!) 121   Resp: (!) 32   Temp:  36.3 C    Last Pain:  Vitals:   08/01/16 2200  TempSrc: Axillary  PainSc:          Complications: No apparent anesthesia complications
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
## 2016-08-01 NOTE — Anesthesia Postprocedure Evaluation (Signed)
Anesthesia Post Note  Patient: Bryan Powell  Procedure(s) Performed: Procedure(s) (LRB): EXPLORATORY LAPAROTOMY (N/A)  Patient location during evaluation: SICU Anesthesia Type: General Level of consciousness: sedated Pain management: pain level controlled Vital Signs Assessment: post-procedure vital signs reviewed and stable Respiratory status: patient remains intubated per anesthesia plan Cardiovascular status: stable Anesthetic complications: no       Last Vitals:  Vitals:   08/01/16 2200 08/01/16 2215  BP: 129/84 128/84  Pulse: 97 (!) 101  Resp: 18 18  Temp: 36.3 C     Last Pain:  Vitals:   08/01/16 2200  TempSrc: Axillary  PainSc:                  Bryan Powell

## 2016-08-01 NOTE — Transfer of Care (Signed)
Immediate Anesthesia Transfer of Care Note  Patient: Bryan Powell  Procedure(s) Performed: Procedure(s): SPLENECTOMY (N/A)  Patient Location: PACU  Anesthesia Type:General  Level of Consciousness: awake, alert  and oriented  Airway & Oxygen Therapy: Patient Spontanous Breathing and Patient connected to nasal cannula oxygen  Post-op Assessment: Report given to RN and Post -op Vital signs reviewed and stable  Post vital signs: Reviewed and stable  Last Vitals:  Vitals:   08/01/16 1355 08/01/16 1548  BP: 115/61 116/76  Pulse: 104 99  Resp: (!) 29 17  Temp:  36.3 C    Last Pain:  Vitals:   08/01/16 1350  TempSrc:   PainSc: 5          Complications: No apparent anesthesia complications

## 2016-08-01 NOTE — Op Note (Signed)
Operative Note  Bryan Powell  [id number]  [id number]  08/01/2016   Surgeon: Berna Buehelsea A Jasie Meleski  Assistant: [name]   Procedure performed: Exploratory laparotomy  Preop diagnosis: hemorrhagic shock s/p emergency splenectomy Post-op diagnosis/intraop findings: hemoperitoneum, bleeding along greater curvature of the stomach  Specimens: none Retained items: none EBL: 2000cc Complications: none  Description of procedure: After obtaining informed consent the patient was taken to the operating room and placed supine on operating room table wheregeneral endotracheal anesthesia was initiated, SCDs applied, and a formal timeout was performed. The abdomen was prepped and draped and the midline closure was reopened by removing all staples and running looped PDS. A large volume of hemoperitoneum was immediately encountered. A pool sucker was placed and bilateral upper quadrants were packed with laparotomy pads. The bleeding was coming from the left upper quadrant. The greater curvature was inspected and a few small bleeders were oversewn with 2-0 silks. There was one area which continued to hemorrhage along the upper body of the stomach. The lesser sac was entered and the greater curve better exposed by dividing the greater omentum from the stomach using ligasure. This afforded better exposure of the bleeding which appeared to be on the omental side. This was oversewn with another 2-0 silk with good effect. A non-expanding hematoma was noted in the retroperitoneum overlying the tail of the pancreas as well. All packs were removed and the abdomen was irrigated and inspected. We did not find any other ongoing bleeding. His hemodynamics had markedly improved. At this point, confirming all counts were correct, the abdomen was closed with running #1 looped pds in the fascia and staples in the skin. A sterile dressing was placed. The patient will be returned to the ICU intubated and sedated.     All counts were correct at the completion of the case.
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
## 2016-08-01 NOTE — Anesthesia Procedure Notes (Signed)
Procedure Name: Intubation Date/Time: 08/01/2016 3:00 PM Performed by: Merdis Delay Pre-anesthesia Checklist: Patient identified, Emergency Drugs available, Suction available, Patient being monitored and Timeout performed Patient Re-evaluated:Patient Re-evaluated prior to inductionOxygen Delivery Method: Circle system utilized Preoxygenation: Pre-oxygenation with 100% oxygen Intubation Type: IV induction and Rapid sequence Laryngoscope Size: Mac and 4 Grade View: Grade I Tube type: Subglottic suction tube Tube size: 8.0 mm Number of attempts: 1 Airway Equipment and Method: Stylet Placement Confirmation: ETT inserted through vocal cords under direct vision,  breath sounds checked- equal and bilateral and positive ETCO2 Secured at: 22 cm Tube secured with: Tape Dental Injury: Teeth and Oropharynx as per pre-operative assessment

## 2016-08-01 NOTE — ED Notes (Signed)
Pt transported to CT with RN

## 2016-08-02 ENCOUNTER — Inpatient Hospital Stay (HOSPITAL_COMMUNITY): Payer: Medicare Other

## 2016-08-02 ENCOUNTER — Other Ambulatory Visit (HOSPITAL_COMMUNITY): Payer: Medicare Other

## 2016-08-02 ENCOUNTER — Encounter (HOSPITAL_COMMUNITY): Payer: Self-pay | Admitting: General Surgery

## 2016-08-02 LAB — CBC
HCT: 36.5 % — ABNORMAL LOW (ref 39.0–52.0)
HCT: 33.3 % — ABNORMAL LOW (ref 39.0–52.0)
HEMATOCRIT: 37.4 % — AB (ref 39.0–52.0)
Hemoglobin: 12.7 g/dL — ABNORMAL LOW (ref 13.0–17.0)
Hemoglobin: 12.5 g/dL — ABNORMAL LOW (ref 13.0–17.0)
Hemoglobin: 11.4 g/dL — ABNORMAL LOW (ref 13.0–17.0)
MCH: 27.7 pg (ref 26.0–34.0)
MCH: 27.8 pg (ref 26.0–34.0)
MCH: 28 pg (ref 26.0–34.0)
MCHC: 34 g/dL (ref 30.0–36.0)
MCHC: 34.2 g/dL (ref 30.0–36.0)
MCHC: 34.2 g/dL (ref 30.0–36.0)
MCV: 81.5 fL (ref 78.0–100.0)
MCV: 81.1 fL (ref 78.0–100.0)
MCV: 81.8 fL (ref 78.0–100.0)
PLATELETS: 42 10*3/uL — AB (ref 150–400)
Platelets: 60 10*3/uL — ABNORMAL LOW (ref 150–400)
Platelets: 82 10*3/uL — ABNORMAL LOW (ref 150–400)
RBC: 4.59 MIL/uL (ref 4.22–5.81)
RBC: 4.5 MIL/uL (ref 4.22–5.81)
RBC: 4.07 MIL/uL — AB (ref 4.22–5.81)
RDW: 15.2 % (ref 11.5–15.5)
RDW: 15.6 % — AB (ref 11.5–15.5)
RDW: 16.3 % — ABNORMAL HIGH (ref 11.5–15.5)
WBC: 11.1 10*3/uL — AB (ref 4.0–10.5)
WBC: 9.2 10*3/uL (ref 4.0–10.5)
WBC: 12.9 10*3/uL — AB (ref 4.0–10.5)

## 2016-08-02 LAB — BASIC METABOLIC PANEL
ANION GAP: 7 (ref 5–15)
BUN: 12 mg/dL (ref 6–20)
CALCIUM: 7.3 mg/dL — AB (ref 8.9–10.3)
CO2: 23 mmol/L (ref 22–32)
Chloride: 115 mmol/L — ABNORMAL HIGH (ref 101–111)
Creatinine, Ser: 0.76 mg/dL (ref 0.61–1.24)
GLUCOSE: 107 mg/dL — AB (ref 65–99)
Potassium: 3.5 mmol/L (ref 3.5–5.1)
SODIUM: 145 mmol/L (ref 135–145)

## 2016-08-02 LAB — POCT I-STAT 7, (LYTES, BLD GAS, ICA,H+H)
Acid-base deficit: 1 mmol/L (ref 0.0–2.0)
BICARBONATE: 25.6 mmol/L (ref 20.0–28.0)
Calcium, Ion: 1.18 mmol/L (ref 1.15–1.40)
HCT: 32 % — ABNORMAL LOW (ref 39.0–52.0)
Hemoglobin: 10.9 g/dL — ABNORMAL LOW (ref 13.0–17.0)
O2 SAT: 100 %
PCO2 ART: 47.9 mmHg (ref 32.0–48.0)
PO2 ART: 439 mmHg — AB (ref 83.0–108.0)
Patient temperature: 36.4
Potassium: 3.8 mmol/L (ref 3.5–5.1)
Sodium: 143 mmol/L (ref 135–145)
TCO2: 27 mmol/L (ref 0–100)
pH, Arterial: 7.334 — ABNORMAL LOW (ref 7.350–7.450)

## 2016-08-02 LAB — PREPARE FRESH FROZEN PLASMA
UNIT DIVISION: 0
UNIT DIVISION: 0
UNIT DIVISION: 0
UNIT DIVISION: 0
Unit division: 0
Unit division: 0
Unit division: 0
Unit division: 0

## 2016-08-02 LAB — PREPARE PLATELET PHERESIS: UNIT DIVISION: 0

## 2016-08-02 LAB — APTT: aPTT: 31 s (ref 24–36)

## 2016-08-02 LAB — COMPREHENSIVE METABOLIC PANEL WITH GFR
ALT: 21 U/L (ref 17–63)
AST: 38 U/L (ref 15–41)
Albumin: 2.6 g/dL — ABNORMAL LOW (ref 3.5–5.0)
Alkaline Phosphatase: 43 U/L (ref 38–126)
Anion gap: 5 (ref 5–15)
BUN: 11 mg/dL (ref 6–20)
CO2: 24 mmol/L (ref 22–32)
Calcium: 7.3 mg/dL — ABNORMAL LOW (ref 8.9–10.3)
Chloride: 116 mmol/L — ABNORMAL HIGH (ref 101–111)
Creatinine, Ser: 0.79 mg/dL (ref 0.61–1.24)
GFR calc Af Amer: >60 mL/min
GFR calc non Af Amer: >60 mL/min
Glucose, Bld: 110 mg/dL — ABNORMAL HIGH (ref 65–99)
Potassium: 3.7 mmol/L (ref 3.5–5.1)
Sodium: 145 mmol/L (ref 135–145)
Total Bilirubin: 0.7 mg/dL (ref 0.3–1.2)
Total Protein: 4.4 g/dL — ABNORMAL LOW (ref 6.5–8.1)

## 2016-08-02 LAB — TROPONIN I
Troponin I: 0.31 ng/mL (ref <0.03)
Troponin I: 0.32 ng/mL (ref <0.03)

## 2016-08-02 LAB — URINALYSIS, ROUTINE W REFLEX MICROSCOPIC
Bilirubin Urine: NEGATIVE
GLUCOSE, UA: NEGATIVE mg/dL
KETONES UR: 15 mg/dL — AB
Leukocytes, UA: NEGATIVE
NITRITE: NEGATIVE
PH: 5.5 (ref 5.0–8.0)
Protein, ur: NEGATIVE mg/dL
Specific Gravity, Urine: 1.025 (ref 1.005–1.030)

## 2016-08-02 LAB — BLOOD PRODUCT ORDER (VERBAL) VERIFICATION

## 2016-08-02 LAB — URINALYSIS, MICROSCOPIC (REFLEX): SQUAMOUS EPITHELIAL / LPF: NONE SEEN

## 2016-08-02 LAB — PROTIME-INR
INR: 1.33
Prothrombin Time: 16.6 s — ABNORMAL HIGH (ref 11.4–15.2)

## 2016-08-02 MED ORDER — ONDANSETRON HCL 4 MG/2ML IJ SOLN
4.0000 mg | Freq: Four times a day (QID) | INTRAMUSCULAR | Status: DC | PRN
  Administered 2016-08-02 – 2016-08-05 (×2): 4 mg via INTRAVENOUS
  Filled 2016-08-02 (×2): qty 2

## 2016-08-02 MED ORDER — LABETALOL HCL 5 MG/ML IV SOLN
10.0000 mg | Freq: Four times a day (QID) | INTRAVENOUS | Status: DC | PRN
  Administered 2016-08-04 (×2): 10 mg via INTRAVENOUS
  Filled 2016-08-02 (×2): qty 4

## 2016-08-02 MED ORDER — ORAL CARE MOUTH RINSE
15.0000 mL | Freq: Four times a day (QID) | OROMUCOSAL | Status: DC
Start: 2016-08-02 — End: 2016-08-03
  Administered 2016-08-02 (×3): 15 mL via OROMUCOSAL

## 2016-08-02 MED ORDER — DIPHENHYDRAMINE HCL 12.5 MG/5ML PO ELIX: 12.5000 mg | ORAL_SOLUTION | Freq: Four times a day (QID) | ORAL | Status: DC | PRN

## 2016-08-02 MED ORDER — HYDRALAZINE HCL 20 MG/ML IJ SOLN
10.0000 mg | Freq: Four times a day (QID) | INTRAMUSCULAR | Status: DC | PRN
  Administered 2016-08-03: 10 mg via INTRAVENOUS
  Filled 2016-08-02: qty 1

## 2016-08-02 MED ORDER — METHOCARBAMOL 1000 MG/10ML IJ SOLN
500.0000 mg | Freq: Three times a day (TID) | INTRAVENOUS | Status: DC | PRN
  Administered 2016-08-02 – 2016-08-03 (×2): 500 mg via INTRAVENOUS
  Filled 2016-08-02 (×6): qty 5

## 2016-08-02 MED ORDER — CHLORHEXIDINE GLUCONATE CLOTH 2 % EX PADS
6.0000 | MEDICATED_PAD | Freq: Every day | CUTANEOUS | Status: DC
  Administered 2016-08-02 – 2016-08-05 (×4): 6 via TOPICAL

## 2016-08-02 MED ORDER — FENTANYL CITRATE (PF) 100 MCG/2ML IJ SOLN
INTRAMUSCULAR | Status: AC
  Administered 2016-08-02: 25 ug via INTRAVENOUS
  Filled 2016-08-02: qty 2

## 2016-08-02 MED ORDER — HYDROMORPHONE 1 MG/ML IV SOLN
INTRAVENOUS | Status: DC
  Administered 2016-08-02: 12:00:00 via INTRAVENOUS
  Administered 2016-08-02: 5.3 mg via INTRAVENOUS
  Administered 2016-08-02: 3.3 mg via INTRAVENOUS
  Administered 2016-08-02: 8.7 mg via INTRAVENOUS
  Administered 2016-08-03: 3.6 mg via INTRAVENOUS
  Administered 2016-08-03: 4.2 mg via INTRAVENOUS
  Administered 2016-08-03: 3.3 mg via INTRAVENOUS
  Administered 2016-08-03: 08:00:00 via INTRAVENOUS
  Administered 2016-08-03 (×2): 5.1 mg via INTRAVENOUS
  Administered 2016-08-03: 4.2 mg via INTRAVENOUS
  Administered 2016-08-04: 3 mg via INTRAVENOUS
  Administered 2016-08-04: 1.2 mg via INTRAVENOUS
  Administered 2016-08-04: 4.5 mg via INTRAVENOUS
  Administered 2016-08-04: 2.4 mg via INTRAVENOUS
  Administered 2016-08-04: 2.1 mg via INTRAVENOUS
  Administered 2016-08-05: 1.5 mg via INTRAVENOUS
  Administered 2016-08-05: 1.2 mg via INTRAVENOUS
  Administered 2016-08-05: 1.8 mg via INTRAVENOUS
  Administered 2016-08-05: 1.2 mg via INTRAVENOUS
  Filled 2016-08-02 (×3): qty 25

## 2016-08-02 MED ORDER — NALOXONE HCL 0.4 MG/ML IJ SOLN: 0.4000 mg | INTRAMUSCULAR | Status: DC | PRN

## 2016-08-02 MED ORDER — CHLORHEXIDINE GLUCONATE 0.12% ORAL RINSE (MEDLINE KIT)
15.0000 mL | Freq: Two times a day (BID) | OROMUCOSAL | Status: DC
  Administered 2016-08-02 – 2016-08-04 (×5): 15 mL via OROMUCOSAL

## 2016-08-02 MED ORDER — DIPHENHYDRAMINE HCL 50 MG/ML IJ SOLN: 12.5000 mg | Freq: Four times a day (QID) | INTRAMUSCULAR | Status: DC | PRN

## 2016-08-02 MED ORDER — SODIUM CHLORIDE 0.9% FLUSH: 9.0000 mL | INTRAVENOUS | Status: DC | PRN

## 2016-08-02 MED ORDER — SODIUM CHLORIDE 0.9 % IV SOLN
INTRAVENOUS | Status: DC
  Administered 2016-08-02 – 2016-08-04 (×6): via INTRAVENOUS

## 2016-08-02 MED FILL — Heparin Sodium (Porcine) Inj 1000 Unit/ML: INTRAMUSCULAR | Qty: 30 | Status: AC

## 2016-08-02 MED FILL — Sodium Chloride IV Soln 0.9%: INTRAVENOUS | Qty: 3000 | Status: AC

## 2016-08-02 NOTE — Progress Notes (Signed)
Wasted 110ml Fentanyl 6910mcs/ml  in sink. Berniece Paponnie Dupont RN.

## 2016-08-02 NOTE — Progress Notes (Signed)
~  0045: 22mL of dilaudid wasted in sink, verified by J. Freida BusmanAllen, RN.

## 2016-08-02 NOTE — Progress Notes (Signed)
Pt complaining of pain despite PCA dilaudid. Discussed pt addiction and difficulty we may have with pain management. Dr. Dwain SarnaWakefield called, notified. Orders for Robaxin 500mg , IV Q8H PRN.  Pt updated. Will continue to monitor closely.

## 2016-08-02 NOTE — Progress Notes (Signed)
MD called and notified of pt's BP climbing. Instructed keep SBP<150 mmHg. Also, PRN orders placed as well as maintenance fluids order.
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
## 2016-08-02 NOTE — Progress Notes (Signed)
    Went to see patient and in our discussion he mentioned that Dr. Jacinto HalimGanji is his cardiologist (saw him a couple months ago.) Discussed case with Dr. Jacinto HalimGanji who will be over to see the patient today or tomorrow. We will sign off.  Cline CrockKathryn Cara Aguino PA-C  [facility]

## 2016-08-02 NOTE — Progress Notes (Signed)
Foley catheter removed without difficulty.

## 2016-08-02 NOTE — Progress Notes (Signed)
Pt extubated, per order, to N/C.  No stridor noted.  Pt able to speak.  RN at bedside.

## 2016-08-03 ENCOUNTER — Inpatient Hospital Stay (HOSPITAL_COMMUNITY): Payer: Medicare Other

## 2016-08-03 LAB — CBC
HCT: 29.5 % — ABNORMAL LOW (ref 39.0–52.0)
HCT: 28.7 % — ABNORMAL LOW (ref 39.0–52.0)
Hemoglobin: 9.7 g/dL — ABNORMAL LOW (ref 13.0–17.0)
Hemoglobin: 9.5 g/dL — ABNORMAL LOW (ref 13.0–17.0)
MCH: 27.7 pg (ref 26.0–34.0)
MCH: 28 pg (ref 26.0–34.0)
MCHC: 32.9 g/dL (ref 30.0–36.0)
MCHC: 33.1 g/dL (ref 30.0–36.0)
MCV: 84.3 fL (ref 78.0–100.0)
MCV: 84.7 fL (ref 78.0–100.0)
PLATELETS: 100 10*3/uL — AB (ref 150–400)
PLATELETS: 112 10*3/uL — AB (ref 150–400)
RBC: 3.5 MIL/uL — AB (ref 4.22–5.81)
RBC: 3.39 MIL/uL — ABNORMAL LOW (ref 4.22–5.81)
RDW: 16.7 % — AB (ref 11.5–15.5)
RDW: 16.6 % — AB (ref 11.5–15.5)
WBC: 14.7 10*3/uL — AB (ref 4.0–10.5)
WBC: 16 10*3/uL — AB (ref 4.0–10.5)

## 2016-08-03 LAB — BASIC METABOLIC PANEL
Anion gap: 7 (ref 5–15)
BUN: 10 mg/dL (ref 6–20)
CALCIUM: 7.7 mg/dL — AB (ref 8.9–10.3)
CO2: 27 mmol/L (ref 22–32)
Chloride: 111 mmol/L (ref 101–111)
Creatinine, Ser: 0.7 mg/dL (ref 0.61–1.24)
GFR calc Af Amer: >60 mL/min (ref >60)
GLUCOSE: 128 mg/dL — AB (ref 65–99)
POTASSIUM: 3.8 mmol/L (ref 3.5–5.1)
SODIUM: 145 mmol/L (ref 135–145)

## 2016-08-03 LAB — ECHOCARDIOGRAM LIMITED
HEIGHTINCHES: 71 in
Weight: 2910.07 oz

## 2016-08-03 MED ORDER — HYDROMORPHONE HCL 1 MG/ML IJ SOLN
1.0000 mg | INTRAMUSCULAR | Status: DC | PRN
  Administered 2016-08-03 – 2016-08-04 (×5): 1 mg via INTRAVENOUS
  Filled 2016-08-03 (×5): qty 1

## 2016-08-03 MED ORDER — CHLORHEXIDINE GLUCONATE 0.12 % MT SOLN
OROMUCOSAL | Status: AC
  Filled 2016-08-03: qty 15

## 2016-08-03 MED ORDER — LORAZEPAM 2 MG/ML IJ SOLN
1.0000 mg | INTRAMUSCULAR | Status: DC | PRN
  Administered 2016-08-03 – 2016-08-04 (×3): 1 mg via INTRAVENOUS
  Filled 2016-08-03 (×3): qty 1

## 2016-08-03 NOTE — Progress Notes (Signed)
Up to chair per pt request x 1 assist.  Void per urinal 250 ml. Wife at bedside. Pt defers robaxin.

## 2016-08-03 NOTE — Progress Notes (Signed)
Pt up in chair requesting to go to bed.  Assist x1 to bed.  Requesting pain shot and PCA button.  Voided for 350 ml.
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
## 2016-08-03 NOTE — Progress Notes (Signed)
PT Cancellation Note  Patient Details Name: Anna GenreCameron Laflamme MRN: [id number] DOB: 04/01/1951   Cancelled Treatment:    Reason Eval/Treat Not Completed: Patient not medically ready Pt on bedrest. Will await increase in activity orders prior to PT evaluation.   Blake DivineShauna A Kynli Chou 08/03/2016, 8:32 AM Mylo RedShauna Shaylinn Hladik, PT, DPT [phone]
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
## 2016-08-03 NOTE — Progress Notes (Signed)
  Echocardiogram 2D Echocardiogram Limited has been performed.  Leta JunglingCooper, Shahin Knierim M 08/03/2016, 9:24 AM

## 2016-08-03 NOTE — Progress Notes (Signed)
Pt unable to void after assist to standing . Rates pain 9/10. Wife at bedside.

## 2016-08-04 LAB — CBC
HEMATOCRIT: 27.1 % — AB (ref 39.0–52.0)
Hemoglobin: 8.8 g/dL — ABNORMAL LOW (ref 13.0–17.0)
MCH: 27.6 pg (ref 26.0–34.0)
MCHC: 32.5 g/dL (ref 30.0–36.0)
MCV: 85 fL (ref 78.0–100.0)
PLATELETS: 143 10*3/uL — AB (ref 150–400)
RBC: 3.19 MIL/uL — AB (ref 4.22–5.81)
RDW: 16 % — AB (ref 11.5–15.5)
WBC: 14.7 10*3/uL — AB (ref 4.0–10.5)

## 2016-08-04 LAB — BASIC METABOLIC PANEL
ANION GAP: 6 (ref 5–15)
BUN: 10 mg/dL (ref 6–20)
CALCIUM: 8.1 mg/dL — AB (ref 8.9–10.3)
CO2: 24 mmol/L (ref 22–32)
Chloride: 114 mmol/L — ABNORMAL HIGH (ref 101–111)
Creatinine, Ser: 0.6 mg/dL — ABNORMAL LOW (ref 0.61–1.24)
GLUCOSE: 164 mg/dL — AB (ref 65–99)
POTASSIUM: 2.8 mmol/L — AB (ref 3.5–5.1)
Sodium: 144 mmol/L (ref 135–145)

## 2016-08-04 MED ORDER — POTASSIUM CHLORIDE 2 MEQ/ML IV SOLN
30.0000 meq | Freq: Once | INTRAVENOUS | Status: AC
  Administered 2016-08-04: 30 meq via INTRAVENOUS
  Filled 2016-08-04: qty 15

## 2016-08-04 MED ORDER — SODIUM CHLORIDE 0.9 % IV SOLN
30.0000 meq | Freq: Once | INTRAVENOUS | Status: AC
  Administered 2016-08-04: 30 meq via INTRAVENOUS
  Filled 2016-08-04 (×2): qty 15

## 2016-08-04 MED ORDER — FUROSEMIDE 10 MG/ML IJ SOLN
20.0000 mg | Freq: Once | INTRAMUSCULAR | Status: AC
  Administered 2016-08-04: 20 mg via INTRAVENOUS
  Filled 2016-08-04: qty 2

## 2016-08-04 MED ORDER — CHLORHEXIDINE GLUCONATE 0.12 % MT SOLN
OROMUCOSAL | Status: AC
  Administered 2016-08-04: 15 mL via OROMUCOSAL
  Filled 2016-08-04: qty 15

## 2016-08-04 MED ORDER — ACETAMINOPHEN 325 MG PO TABS
650.0000 mg | ORAL_TABLET | ORAL | Status: DC | PRN
  Administered 2016-08-04: 650 mg via ORAL
  Filled 2016-08-04: qty 2

## 2016-08-04 MED ORDER — DEXMEDETOMIDINE HCL IN NACL 200 MCG/50ML IV SOLN
0.4000 ug/kg/h | INTRAVENOUS | Status: DC
  Administered 2016-08-04: 0.6 ug/kg/h via INTRAVENOUS
  Administered 2016-08-04 (×2): 0.4 ug/kg/h via INTRAVENOUS
  Administered 2016-08-05: 0.2 ug/kg/h via INTRAVENOUS
  Administered 2016-08-05: 0.4 ug/kg/h via INTRAVENOUS
  Filled 2016-08-04 (×5): qty 50

## 2016-08-04 MED ORDER — ORAL CARE MOUTH RINSE
15.0000 mL | Freq: Two times a day (BID) | OROMUCOSAL | Status: DC
  Administered 2016-08-04 (×2): 15 mL via OROMUCOSAL

## 2016-08-04 MED ORDER — CHLORHEXIDINE GLUCONATE 0.12 % MT SOLN
15.0000 mL | Freq: Two times a day (BID) | OROMUCOSAL | Status: DC
  Administered 2016-08-04 – 2016-08-05 (×2): 15 mL via OROMUCOSAL
  Filled 2016-08-04 (×2): qty 15

## 2016-08-04 MED ORDER — CLONAZEPAM 0.5 MG PO TABS
0.5000 mg | ORAL_TABLET | Freq: Two times a day (BID) | ORAL | Status: DC
  Administered 2016-08-04 – 2016-08-07 (×7): 0.5 mg via ORAL
  Filled 2016-08-04 (×7): qty 1

## 2016-08-04 MED ORDER — QUETIAPINE FUMARATE 50 MG PO TABS
50.0000 mg | ORAL_TABLET | Freq: Two times a day (BID) | ORAL | Status: DC
  Administered 2016-08-04 – 2016-08-06 (×6): 50 mg via ORAL
  Filled 2016-08-04 (×2): qty 2
  Filled 2016-08-04 (×3): qty 1
  Filled 2016-08-04: qty 2
  Filled 2016-08-04: qty 1

## 2016-08-04 NOTE — Progress Notes (Signed)
Pt noted to be trying to get out of bed, stating "I've got to get to my wife, so she can get me out of here." Patient had pulled NG out, and is refusing to let this nurse replace it. Patient reoriented, on call trauma MD notified. No new orders at this time, will keep NG out. Will continue to monitor. Dicie BeamFrazier, Noura Purpura RN BSN.
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
## 2016-08-04 NOTE — Progress Notes (Signed)
Patient now with condom catheter, voided approximately 400 mL of urine. Small amount of frank blood noted when patient finished voiding. Dr. Janee Mornhompson aware. VS stable: HR 67, BP 137/67, RR 34, O2 sat 94%. Will continue to monitor patient and notify if he becomes symptomatic or there is an increase in the amount of blood in his urine.

## 2016-08-05 LAB — TYPE AND SCREEN
ABO/RH(D): O POS
Antibody Screen: NEGATIVE
UNIT DIVISION: 0
UNIT DIVISION: 0
UNIT DIVISION: 0
UNIT DIVISION: 0
UNIT DIVISION: 0
UNIT DIVISION: 0
UNIT DIVISION: 0
Unit division: 0
Unit division: 0
Unit division: 0
Unit division: 0
Unit division: 0
Unit division: 0
Unit division: 0
Unit division: 0
Unit division: 0
Unit division: 0
Unit division: 0

## 2016-08-05 LAB — BASIC METABOLIC PANEL
Anion gap: 5 (ref 5–15)
BUN: 12 mg/dL (ref 6–20)
CO2: 24 mmol/L (ref 22–32)
CREATININE: 0.67 mg/dL (ref 0.61–1.24)
Calcium: 7.9 mg/dL — ABNORMAL LOW (ref 8.9–10.3)
Chloride: 113 mmol/L — ABNORMAL HIGH (ref 101–111)
GFR calc Af Amer: >60 mL/min (ref >60)
GLUCOSE: 122 mg/dL — AB (ref 65–99)
POTASSIUM: 3.1 mmol/L — AB (ref 3.5–5.1)
Sodium: 142 mmol/L (ref 135–145)

## 2016-08-05 LAB — CBC
HCT: 25.3 % — ABNORMAL LOW (ref 39.0–52.0)
Hemoglobin: 8.2 g/dL — ABNORMAL LOW (ref 13.0–17.0)
MCH: 27.5 pg (ref 26.0–34.0)
MCHC: 32.4 g/dL (ref 30.0–36.0)
MCV: 84.9 fL (ref 78.0–100.0)
PLATELETS: 165 10*3/uL (ref 150–400)
RBC: 2.98 MIL/uL — ABNORMAL LOW (ref 4.22–5.81)
RDW: 15.7 % — AB (ref 11.5–15.5)
WBC: 14.8 10*3/uL — ABNORMAL HIGH (ref 4.0–10.5)

## 2016-08-05 MED ORDER — GUAIFENESIN 200 MG PO TABS
200.0000 mg | ORAL_TABLET | Freq: Four times a day (QID) | ORAL | Status: AC
  Administered 2016-08-05 (×3): 200 mg via ORAL
  Filled 2016-08-05 (×5): qty 1

## 2016-08-05 MED ORDER — ENOXAPARIN SODIUM 40 MG/0.4ML ~~LOC~~ SOLN
40.0000 mg | SUBCUTANEOUS | Status: DC
  Administered 2016-08-05 – 2016-08-09 (×5): 40 mg via SUBCUTANEOUS
  Filled 2016-08-05 (×6): qty 0.4

## 2016-08-05 MED ORDER — POTASSIUM CHLORIDE CRYS ER 20 MEQ PO TBCR
40.0000 meq | EXTENDED_RELEASE_TABLET | Freq: Once | ORAL | Status: AC
  Administered 2016-08-05: 40 meq via ORAL
  Filled 2016-08-05: qty 2

## 2016-08-05 MED ORDER — OXYCODONE HCL 5 MG PO TABS
5.0000 mg | ORAL_TABLET | ORAL | Status: DC | PRN
  Administered 2016-08-05 (×2): 15 mg via ORAL
  Administered 2016-08-05: 10 mg via ORAL
  Administered 2016-08-06: 15 mg via ORAL
  Filled 2016-08-05: qty 2
  Filled 2016-08-05 (×4): qty 3

## 2016-08-05 MED ORDER — HYDROMORPHONE HCL 1 MG/ML IJ SOLN
1.0000 mg | INTRAMUSCULAR | Status: DC | PRN
  Administered 2016-08-05 – 2016-08-06 (×3): 1 mg via INTRAVENOUS
  Filled 2016-08-05 (×3): qty 1

## 2016-08-05 MED ORDER — POTASSIUM CHLORIDE CRYS ER 10 MEQ PO TBCR
EXTENDED_RELEASE_TABLET | ORAL | Status: AC
  Filled 2016-08-05: qty 4
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
## 2016-08-05 NOTE — Progress Notes (Signed)
Pt c/o pain and nausea rates pain 7/10.  Medicate diwth dilaudid and zofran, see MAR.

## 2016-08-05 NOTE — Progress Notes (Signed)
Spoke with Dr.Thompson states can transfer to another floor . Complete bath given. Stand to urinate, small amount blood observed from penis. Denies pain.

## 2016-08-05 NOTE — Progress Notes (Signed)
CSW engaged with Patient at his bedside. CSW introduced self, role of CSW, and initiated conversation on substance abuse concerns. Patient very cooperative, engaging, and able to identify that he needs help with obtaining and maintaining his sobriety. Patient reports that he has battled with heroin abuse for 35+ years. Patient reports that the longest period he has been able to abstain from substance abuse was 12 years. Patient attributes this to attending AA and NA meetings. Patient reports that he has been to two residential treatment centers in the past but is unable to identify the names of the facilities. 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 Patient reports that he also has worked on an [employer], [employer] team in the past. CSW provided Patient with substance abuse resources including, outpatient, intensive outpatient, and residential treatment options. CSW discussed the effects of substance abuse on Patient's physical and mental health. Patient able to verbalize understanding. CSW signing off at this time. Please consult should new need(s) arise.    Bryan FlingAshley Casaundra Powell, MSW, LCSW [facility] ED/85M Clinical Social Worker [phone]
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
## 2016-08-05 NOTE — Progress Notes (Signed)
Sitting up in bed awake and alerty.  Precedex decreased to . .

## 2016-08-06 LAB — CBC WITH DIFFERENTIAL/PLATELET
BASOS ABS: 0 10*3/uL (ref 0.0–0.1)
Basophils Relative: 0 %
EOS PCT: 3 %
Eosinophils Absolute: 0.3 10*3/uL (ref 0.0–0.7)
HEMATOCRIT: 27.4 % — AB (ref 39.0–52.0)
Hemoglobin: 8.9 g/dL — ABNORMAL LOW (ref 13.0–17.0)
LYMPHS ABS: 3.3 10*3/uL (ref 0.7–4.0)
LYMPHS PCT: 27 %
MCH: 27.6 pg (ref 26.0–34.0)
MCHC: 32.5 g/dL (ref 30.0–36.0)
MCV: 84.8 fL (ref 78.0–100.0)
MONO ABS: 1.4 10*3/uL — AB (ref 0.1–1.0)
MONOS PCT: 11 %
NEUTROS ABS: 7.4 10*3/uL (ref 1.7–7.7)
Neutrophils Relative %: 60 %
PLATELETS: 248 10*3/uL (ref 150–400)
RBC: 3.23 MIL/uL — ABNORMAL LOW (ref 4.22–5.81)
RDW: 14.8 % (ref 11.5–15.5)
WBC: 12.4 10*3/uL — ABNORMAL HIGH (ref 4.0–10.5)

## 2016-08-06 LAB — CBC
HEMATOCRIT: 19.1 % — AB (ref 39.0–52.0)
HEMOGLOBIN: 6.3 g/dL — AB (ref 13.0–17.0)
MCH: 27.6 pg (ref 26.0–34.0)
MCHC: 33 g/dL (ref 30.0–36.0)
MCV: 83.8 fL (ref 78.0–100.0)
Platelets: 255 10*3/uL (ref 150–400)
RBC: 2.28 MIL/uL — AB (ref 4.22–5.81)
RDW: 15.2 % (ref 11.5–15.5)
WBC: 14.4 10*3/uL — AB (ref 4.0–10.5)

## 2016-08-06 LAB — PREPARE RBC (CROSSMATCH)

## 2016-08-06 MED ORDER — OXYCODONE HCL 5 MG PO TABS
10.0000 mg | ORAL_TABLET | ORAL | Status: DC | PRN
  Administered 2016-08-06 (×3): 20 mg via ORAL
  Administered 2016-08-06: 15 mg via ORAL
  Administered 2016-08-07 – 2016-08-09 (×16): 20 mg via ORAL
  Filled 2016-08-06 (×19): qty 4

## 2016-08-06 MED ORDER — PROSIGHT PO TABS
1.0000 | ORAL_TABLET | Freq: Every day | ORAL | Status: DC
  Administered 2016-08-06 – 2016-08-09 (×4): 1 via ORAL
  Filled 2016-08-06 (×4): qty 1

## 2016-08-06 MED ORDER — FERROUS GLUCONATE 324 (38 FE) MG PO TABS
324.0000 mg | ORAL_TABLET | Freq: Two times a day (BID) | ORAL | Status: DC
  Administered 2016-08-06 – 2016-08-09 (×8): 324 mg via ORAL
  Filled 2016-08-06 (×10): qty 1

## 2016-08-06 MED ORDER — KETOROLAC TROMETHAMINE 30 MG/ML IJ SOLN
30.0000 mg | Freq: Once | INTRAMUSCULAR | Status: AC
  Administered 2016-08-06: 30 mg via INTRAVENOUS
  Filled 2016-08-06: qty 1

## 2016-08-06 MED ORDER — SODIUM CHLORIDE 0.9 % IV SOLN: Freq: Once | INTRAVENOUS | Status: DC

## 2016-08-06 MED ORDER — HYDROMORPHONE HCL 1 MG/ML IJ SOLN: 1.0000 mg | INTRAMUSCULAR | Status: DC | PRN

## 2016-08-06 NOTE — Progress Notes (Signed)
Trauma Service Note  Subjective: Patient doing great.  Hemoglobin dropped suspiciously this AM with a rising platelet count.  Objective: Vital signs in last 24 hours: Temp:  [98.7 F (37.1 C)-100.8 F (38.2 C)] 98.7 F (37.1 C) (02/24 0800) Pulse Rate:  [75-113] 84 (02/24 0800) Resp:  [26-37] 27 (02/24 0800) BP: (137-159)/(63-97) 142/77 (02/24 0800) SpO2:  [93 %-99 %] 98 % (02/24 0800) Last BM Date: 08/05/16  Intake/Output from previous day: 02/23 0701 - 02/24 0700 In: 902.7 [P.O.:690; I.V.:212.7] Out: 1076 [Urine:1075; Stool:1] Intake/Output this shift: No intake/output data recorded.  General: No distress.  Nice guy  Lungs: Clear to auscultation.  Abd: Soft, great bowel sounds.  Extremities: No changes  Neuro: Intact  Lab Results: CBC   Recent Labs  08/05/16 0555 08/06/16 0451  WBC 14.8* 14.4*  HGB 8.2* 6.3*  HCT 25.3* 19.1*  PLT 165 255   BMET  Recent Labs  08/04/16 1055 08/05/16 0555  NA 144 142  K 2.8* 3.1*  CL 114* 113*  CO2 24 24  GLUCOSE 164* 122*  BUN 10 12  CREATININE 0.60* 0.67  CALCIUM 8.1* 7.9*   PT/INR No results for input(s): LABPROT, INR in the last 72 hours. ABG No results for input(s): PHART, HCO3 in the last 72 hours.  Invalid input(s): PCO2, PO2  Studies/Results: No results found.  Anti-infectives: Anti-infectives    None      Assessment/Plan: s/p Procedure(s): EXPLORATORY LAPAROTOMY Advance diet Rechecking CBC stat because it is inconsistent with prvious labs.  Patient is getting one unit of PRBCs now.  Will not give the other if repat labs okay.  Will start iron and vitamins  LOS: 5 days   Marta LamasJames O. Gae BonWyatt, III, MD, FACS [phone] Trauma Surgeon 08/06/2016

## 2016-08-06 NOTE — Progress Notes (Signed)
CRITICAL VALUE ALERT  Critical value received:  hgb 6.3  Date of notification:  08/06/16  Time of notification:  0525  Critical value read back: yes  Nurse who received alert:  Wendelyn BreslowSharon Tangelia Sanson, RN  MD notified (1st page): Dr. Francena HanlyKisinger  Time of first page:  0530  MD notified (2nd page):  Time of second page:  Responding MD:  Dr. Francena HanlyKisinger  Time MD responded:  [phone]
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
## 2016-08-07 MED ORDER — LORAZEPAM 0.5 MG PO TABS
0.5000 mg | ORAL_TABLET | Freq: Two times a day (BID) | ORAL | Status: DC | PRN
  Administered 2016-08-08: 0.5 mg via ORAL
  Filled 2016-08-07: qty 1

## 2016-08-07 MED ORDER — TRAMADOL HCL 50 MG PO TABS
50.0000 mg | ORAL_TABLET | Freq: Four times a day (QID) | ORAL | Status: DC | PRN
  Administered 2016-08-07 – 2016-08-08 (×2): 50 mg via ORAL
  Filled 2016-08-07 (×2): qty 1

## 2016-08-07 MED ORDER — QUETIAPINE FUMARATE 100 MG PO TABS
100.0000 mg | ORAL_TABLET | Freq: Every day | ORAL | Status: DC
Start: 2016-08-07 — End: 2016-08-10
  Administered 2016-08-07 – 2016-08-08 (×2): 100 mg via ORAL
  Filled 2016-08-07 (×2): qty 1

## 2016-08-07 MED ORDER — CYCLOBENZAPRINE HCL 10 MG PO TABS: 10.0000 mg | ORAL_TABLET | Freq: Two times a day (BID) | ORAL | Status: DC | PRN

## 2016-08-07 MED ORDER — HYDROMORPHONE HCL 2 MG/ML IJ SOLN
1.0000 mg | INTRAMUSCULAR | Status: DC | PRN
  Administered 2016-08-09: 1 mg via INTRAVENOUS
  Filled 2016-08-07: qty 1

## 2016-08-07 NOTE — Progress Notes (Signed)
Trauma Service Note  Subjective: Patient with some concerns, phlegm in the back of his throat.  No acute distress. Sitting up in chair   Objective: Vital signs in last 24 hours: Temp:  [98.1 F (36.7 C)-98.9 F (37.2 C)] 98.9 F (37.2 C) (02/25 0752) Pulse Rate:  [70-92] 70 (02/25 0752) Resp:  [18-30] 22 (02/25 0752) BP: (128-145)/(67-82) 137/70 (02/25 0752) SpO2:  [90 %-97 %] 90 % (02/25 0752) Last BM Date: 08/05/16  Intake/Output from previous day: 02/24 0701 - 02/25 0700 In: 284 [Blood:284] Out: 525 [Urine:525] Intake/Output this shift: Total I/O In: 240 [P.O.:240] Out: 250 [Urine:250]  General: No acute distress  Lungs: Clear  Abd: Soft, good bowel sounds.  No a good appetite.  Extremities: No changes  Neuro: Intact  Lab Results: CBC   Recent Labs  08/06/16 0451 08/06/16 1400  WBC 14.4* 12.4*  HGB 6.3* 8.9*  HCT 19.1* 27.4*  PLT 255 248   BMET  Recent Labs  08/04/16 1055 08/05/16 0555  NA 144 142  K 2.8* 3.1*  CL 114* 113*  CO2 24 24  GLUCOSE 164* 122*  BUN 10 12  CREATININE 0.60* 0.67  CALCIUM 8.1* 7.9*   PT/INR No results for input(s): LABPROT, INR in the last 72 hours. ABG No results for input(s): PHART, HCO3 in the last 72 hours.  Invalid input(s): PCO2, PO2  Studies/Results: No results found.  Anti-infectives: Anti-infectives    None      Assessment/Plan: s/p Procedure(s): EXPLORATORY LAPAROTOMY Advance diet Restart home meds  Transfer to telemetry on 6N  LOS: 6 days   Marta LamasJames O. Gae BonWyatt, III, MD, FACS [phone] Trauma Surgeon 08/07/2016

## 2016-08-07 NOTE — Progress Notes (Signed)
Patient to transfer to 6N26 report given to receiving nurse, all questions answered at this time.  Pt. VSS with no s/s of distress noted.  Patient stable for transfer.

## 2016-08-08 ENCOUNTER — Inpatient Hospital Stay (HOSPITAL_COMMUNITY): Payer: Medicare Other

## 2016-08-08 LAB — URINALYSIS, ROUTINE W REFLEX MICROSCOPIC
Bilirubin Urine: NEGATIVE
Glucose, UA: 50 mg/dL — AB
Hgb urine dipstick: NEGATIVE
Ketones, ur: NEGATIVE mg/dL
Leukocytes, UA: NEGATIVE
NITRITE: NEGATIVE
Protein, ur: NEGATIVE mg/dL
SPECIFIC GRAVITY, URINE: 1.011 (ref 1.005–1.030)
pH: 7 (ref 5.0–8.0)

## 2016-08-08 LAB — EXPECTORATED SPUTUM ASSESSMENT W REFEX TO RESP CULTURE

## 2016-08-08 LAB — CBC WITH DIFFERENTIAL/PLATELET
BASOS ABS: 0 10*3/uL (ref 0.0–0.1)
BASOS ABS: 0 10*3/uL (ref 0.0–0.1)
BASOS PCT: 0 %
Basophils Relative: 0 %
Eosinophils Absolute: 1 10*3/uL — ABNORMAL HIGH (ref 0.0–0.7)
Eosinophils Absolute: 0.8 10*3/uL — ABNORMAL HIGH (ref 0.0–0.7)
Eosinophils Relative: 5 %
Eosinophils Relative: 5 %
HCT: 28.3 % — ABNORMAL LOW (ref 39.0–52.0)
HEMATOCRIT: 27 % — AB (ref 39.0–52.0)
Hemoglobin: 8.8 g/dL — ABNORMAL LOW (ref 13.0–17.0)
Hemoglobin: 9.3 g/dL — ABNORMAL LOW (ref 13.0–17.0)
LYMPHS ABS: 5.3 10*3/uL — AB (ref 0.7–4.0)
LYMPHS ABS: 5.2 10*3/uL — AB (ref 0.7–4.0)
LYMPHS PCT: 27 %
Lymphocytes Relative: 32 %
MCH: 27.5 pg (ref 26.0–34.0)
MCH: 27.6 pg (ref 26.0–34.0)
MCHC: 32.6 g/dL (ref 30.0–36.0)
MCHC: 32.9 g/dL (ref 30.0–36.0)
MCV: 84.4 fL (ref 78.0–100.0)
MCV: 84 fL (ref 78.0–100.0)
MONO ABS: 1.6 10*3/uL — AB (ref 0.1–1.0)
MONOS PCT: 10 %
Monocytes Absolute: 2 10*3/uL — ABNORMAL HIGH (ref 0.1–1.0)
Monocytes Relative: 10 %
NEUTROS ABS: 11.5 10*3/uL — AB (ref 1.7–7.7)
Neutro Abs: 8.5 10*3/uL — ABNORMAL HIGH (ref 1.7–7.7)
Neutrophils Relative %: 58 %
Neutrophils Relative %: 53 %
PLATELETS: 409 10*3/uL — AB (ref 150–400)
PLATELETS: 454 10*3/uL — AB (ref 150–400)
RBC: 3.2 MIL/uL — ABNORMAL LOW (ref 4.22–5.81)
RBC: 3.37 MIL/uL — AB (ref 4.22–5.81)
RDW: 15.1 % (ref 11.5–15.5)
RDW: 15 % (ref 11.5–15.5)
WBC: 19.8 10*3/uL — ABNORMAL HIGH (ref 4.0–10.5)
WBC: 16.1 10*3/uL — AB (ref 4.0–10.5)

## 2016-08-08 LAB — EXPECTORATED SPUTUM ASSESSMENT W GRAM STAIN, RFLX TO RESP C

## 2016-08-08 MED ORDER — SODIUM CHLORIDE 0.9% FLUSH
10.0000 mL | INTRAVENOUS | Status: DC | PRN
  Administered 2016-08-08: 30 mL
  Filled 2016-08-08: qty 40
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
## 2016-08-08 NOTE — Care Management Important Message (Signed)
Important Message  Patient Details  Name: Bryan GenreCameron Powell MRN: [id number] Date of Birth: 12/15/1950   Medicare Important Message Given:  Yes    Chastity Noland Stefan ChurchBratton 08/08/2016, 3:53 PM

## 2016-08-08 NOTE — Clinical Social Work Note (Signed)
Clinical Social Work Assessment  Patient Details  Name: Bryan Powell MRN: [id number] Date of Birth: Jul 30, 1950  Date of referral:  08/03/16               Reason for consult:  Trauma, Substance Use/ETOH Abuse                Permission sought to share information with:  Family Supports Permission granted to share information::  Yes, Verbal Permission Granted  Name::     Bryan Powell  Relationship::  Spouse  Contact Information:  [phone]  Housing/Transportation Living arrangements for the past 2 months:  [place] of Information:  Patient Patient Interpreter Needed:  None Criminal Activity/Legal Involvement Pertinent to Current Situation/Hospitalization:  No - Comment as needed Significant Relationships:  Spouse Lives with:  Spouse Do you feel safe going back to the place where you live?  Yes Need for family participation in patient care:  Yes (Comment)  Care giving concerns:  No family/friends at bedside, however patient does state that patient spouse is working full time and has concerns about patient being home alone following hospitalization.   Social Worker assessment / plan:  Holiday representative met with patient at bedside to offer support and discuss patient needs at discharge.  Patient states that he is recently retired and lives at home with his spouse.  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  Patient also admits to heroin use since he was 67 years old.  Patient states that he has had several moments of sobriety but overall he has been using for over 30 years.  Patient current route of injection is through the muscle in his bottom since all of his access points are blown from long standing use.  Patient states that his wife is aware of his use and is supportive of him receiving further support following hospitalization.  CSW awaiting patient ability to recover physically to determine most appropriate  treatment options for patient at discharge.  Patient agreeable to resources and will further explore with his wife about his options.  CSW remains available for support and to facilitate patient needs once appropriate.  SBIRT complete.  Employment status:  Retired Forensic scientist:  Medicare PT Recommendations:  No Follow Up Information / Referral to community resources:  SBIRT, Residential Substance Abuse Treatment Options, Outpatient Substance Abuse Treatment Options  Patient/Family's Response to care:  Patient is very receptive of CSW support and involvement and is hopeful for the time of sobriety while hospitalized.  Patient states that he know "what to do," just not "how to do it."  Patient hopeful and engaged in available support options at discharge.  Patient/Family's Understanding of and Emotional Response to Diagnosis, Current Treatment, and Prognosis:  Patient is emotional regarding his current heroin use and states that he has completely gotten out of control.  Patient with good family support and recognizes that he needs a long term treatment plan.  Patient only wish is to not get enrolled in a methadone program to cease use.  Emotional Assessment Appearance:  Appears older than stated age Attitude/Demeanor/Rapport:  Lethargic, Inconsistent (Engaged) Affect (typically observed):  Accepting, Calm, Guarded, Sad, Restless Orientation:  Oriented to Self, Oriented to Situation, Oriented to Place, Oriented to  Time Alcohol / Substance use:  Illicit Drugs (Heroin) Psych involvement (Current and /or in the community):  No (Comment)  Discharge Needs  Concerns to be addressed:  Coping/Stress Concerns, Substance Abuse  Concerns Readmission within the last 30 days:  No Current discharge risk:  Substance Abuse Barriers to Discharge:  Continued Medical Work up  [employer], [place]
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
## 2016-08-09 LAB — BASIC METABOLIC PANEL
ANION GAP: 9 (ref 5–15)
BUN: 6 mg/dL (ref 6–20)
CO2: 28 mmol/L (ref 22–32)
Calcium: 8.4 mg/dL — ABNORMAL LOW (ref 8.9–10.3)
Chloride: 103 mmol/L (ref 101–111)
Creatinine, Ser: 0.61 mg/dL (ref 0.61–1.24)
GLUCOSE: 130 mg/dL — AB (ref 65–99)
POTASSIUM: 2.7 mmol/L — AB (ref 3.5–5.1)
Sodium: 140 mmol/L (ref 135–145)

## 2016-08-09 MED ORDER — POTASSIUM CHLORIDE CRYS ER 20 MEQ PO TBCR: 20.0000 meq | EXTENDED_RELEASE_TABLET | Freq: Two times a day (BID) | ORAL | 1 refills | Status: DC

## 2016-08-09 MED ORDER — AMOXICILLIN 875 MG PO TABS: 875.0000 mg | ORAL_TABLET | Freq: Two times a day (BID) | ORAL | 0 refills | Status: AC

## 2016-08-09 MED ORDER — SODIUM CHLORIDE 0.9 % IV SOLN
Freq: Once | INTRAVENOUS | Status: AC
  Administered 2016-08-09: 11:00:00 via INTRAVENOUS
  Filled 2016-08-09: qty 1000

## 2016-08-09 MED ORDER — HAEMOPHILUS B POLYSAC CONJ VAC IM SOLR
0.5000 mL | INTRAMUSCULAR | Status: AC | PRN
  Administered 2016-08-09: 0.5 mL via INTRAMUSCULAR
  Filled 2016-08-09: qty 0.5

## 2016-08-09 MED ORDER — OXYCODONE HCL 10 MG PO TABS: 10.0000 mg | ORAL_TABLET | Freq: Four times a day (QID) | ORAL | 0 refills | Status: DC | PRN

## 2016-08-09 MED ORDER — MENINGOCOCCAL VAC A,C,Y,W-135 ~~LOC~~ INJ
0.5000 mL | INJECTION | SUBCUTANEOUS | Status: DC | PRN
  Filled 2016-08-09: qty 0.5

## 2016-08-09 MED ORDER — PNEUMOCOCCAL 13-VAL CONJ VACC IM SUSP
0.5000 mL | INTRAMUSCULAR | Status: AC | PRN
  Administered 2016-08-09: 0.5 mL via INTRAMUSCULAR
  Filled 2016-08-09: qty 0.5
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
## 2016-08-09 NOTE — Care Management Note (Signed)
Case Management Note  Patient Details  Name: Anna GenreCameron Maack MRN: [id number] Date of Birth: 09/14/1950  Subjective/Objective:  Pt medically stable for discharge home today.  He states wife and son will assist with care at discharge.  PT recommending HHPT and RW.                    Action/Plan: [facility] and D[facility] needs, per pt choice.  Start of care 24-48h post dc date.  RW to be delivered to pt's room prior to dc home.    Expected Discharge Date:  08/09/16               Expected Discharge Plan:  Home w Home Health Services  In-House Referral:  Clinical Social Work  Discharge planning Services  CM Consult  Post Acute Care Choice:  Home Health Choice offered to:  Patient  DME Arranged:  Dan HumphreysWalker DME Agency:  [facility]  [facility]  [facility]  Status of Service:  Completed, signed off  If discussed at Long Length of Stay Meetings, dates discussed:    Additional Comments:  Quintella BatonJulie W. Petra Dumler, RN, BSN  Trauma/Neuro ICU Case Manager [phone]
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
## 2016-08-09 NOTE — Discharge Instructions (Signed)
GO TO HEALTH DEPARTMENT IN 8 WEEKS (10/10/16) TO RECEIVE ANOTHER PNEUMOCOCCAL VACCINATION (PCV-23)   [facility]      [facility], [place] [phone]  OPEN ABDOMINAL SURGERY: POST OP INSTRUCTIONS  Always review your discharge instruction sheet given to you by the facility where your surgery was performed.  IF YOU HAVE DISABILITY OR FAMILY LEAVE FORMS, YOU MUST BRING THEM TO THE OFFICE FOR PROCESSING.  PLEASE DO NOT GIVE THEM TO YOUR DOCTOR.  1. A prescription for pain medication may be given to you upon discharge.  Take your pain medication as prescribed, if needed.  If narcotic pain medicine is not needed, then you may take acetaminophen (Tylenol) or ibuprofen (Advil) as needed. 2. Take your usually prescribed medications unless otherwise directed. 3. If you need a refill on your pain medication, please contact your pharmacy. They will contact our office to request authorization.  Prescriptions will not be filled after 5pm or on week-ends. 4. You should follow a light diet the first few days after arrival home, such as soup and crackers, pudding, etc.unless your doctor has advised otherwise. A high-fiber, low fat diet can be resumed as tolerated.   Be sure to include lots of fluids daily. Most patients will experience some swelling and bruising on the chest and neck area.  Ice packs will help.  Swelling and bruising can take several days to resolve 5. Most patients will experience some swelling and bruising in the area of the incision. Ice pack will help. Swelling and bruising can take several days to resolve..  6. It is common to experience some constipation if taking pain medication after surgery.  Increasing fluid intake and taking a stool softener will usually help or prevent this problem from occurring.  A mild laxative (Milk of Magnesia or Miralax) should be taken according to package directions if there are no bowel movements after 48 hours. 7.  You may have steri-strips (small skin tapes)  in place directly over the incision.  These strips should be left on the skin for 7-10 days.  If your surgeon used skin glue on the incision, you may shower in 24 hours.  The glue will flake off over the next 2-3 weeks.  Any sutures or staples will be removed at the office during your follow-up visit. You may find that a light gauze bandage over your incision may keep your staples from being rubbed or pulled. You may shower and replace the bandage daily. 8. ACTIVITIES:  You may resume regular (light) daily activities beginning the next day--such as daily self-care, walking, climbing stairs--gradually increasing activities as tolerated.  You may have sexual intercourse when it is comfortable.  Refrain from any heavy lifting or straining until approved by your doctor. a. You may drive when you no longer are taking prescription pain medication, you can comfortably wear a seatbelt, and you can safely maneuver your car and apply brakes b. Return to Work: ___________________________________ 9. You should see your doctor in the office for a follow-up appointment approximately two weeks after your surgery.  Make sure that you call for this appointment within a day or two after you arrive home to insure a convenient appointment time. OTHER INSTRUCTIONS:  _____________________________________________________________ _____________________________________________________________  WHEN TO CALL YOUR DOCTOR: 1. Fever over 101.0 2. Inability to urinate 3. Nausea and/or vomiting 4. Extreme swelling or bruising 5. Continued bleeding from incision. 6. Increased pain, redness, or drainage from the incision. 7. Difficulty swallowing or breathing 8. Muscle cramping or spasms. 9. Numbness  or tingling in hands or feet or around lips.  The clinic staff is available to answer your questions during regular business hours.  Please dont hesitate to call and ask to speak to one of the nurses if you have concerns.  For  further questions, please visit [email or web address]   Open Splenectomy, Care After Refer to this sheet in the next few weeks. These instructions provide you with information about caring for yourself after your procedure. Your health care provider may also give you more specific instructions. Your treatment has been planned according to current medical practices, but problems sometimes occur. Call your health care provider if you have any problems or questions after your procedure. What can I expect after the procedure? After the procedure, it is common to have:  Mild pain.  Lack of energy. Follow these instructions at home: Medicines   Take over-the-counter and prescription medicines only as told by your health care provider.  Do not drive or operate heavy machinery while taking prescription pain medicine.  Talk with your health care provider about the need for vaccinations to help prevent infections. Incision care    Follow instructions from your health care provider about how to take care of your incision. Make sure you:  Wash your hands with soap and water before you change your bandage (dressing). If soap and water are not available, use hand sanitizer.  Change your dressing as told by your health care provider.  Leave stitches (sutures), skin glue, or adhesive strips in place. These skin closures may need to be in place for 2 weeks or longer. If adhesive strip edges start to loosen and curl up, you may trim the loose edges. Do not remove adhesive strips completely unless your health care provider tells you to do that.  Check your incision area every day for signs of infection. Check for:  More redness, swelling, or pain.  More fluid or blood.  Warmth.  Pus or a bad smell.  Do not take baths, swim, or use a hot tub until your health care provider approves. Ask your health care provider if you can take showers. You may only be allowed to take sponge baths for  bathing. Eating and drinking   Drink enough fluid to keep your urine clear or pale yellow.  Return to your normal diet as told by your health care provider. Activity   Return to your normal activities as told by your health care provider. Ask your health care provider what activities are safe for you.  Avoid strenuous activity for 4 weeks or as told by your health care provider.  Do not lift anything that is heavier than 10 lb (4.5 kg).  Walk as much as possible.  Ask your health care provider when it is safe to drive, have sex, or go back to work. General instructions   Continue to practice deep breathing as told by your health care provider.  Always tell your health care providers that you do not have a spleen before you have any procedures. These include medical and dental procedures.  Keep all follow-up visits as told by your health care provider. This is important. Contact a health care provider if:  You have pain that is not helped by medicine.  You have more redness, swelling, or pain around your incision.  You have more fluid or blood coming from your incision.  Your incision feels warm to the touch.  You have pus or a bad smell coming from your incision.  You  have a fever or chills.  You have nausea or vomiting. Get help right away if:  Your pain gets much worse.  Your legs are red, swollen, or painful.  You have chest pain.  You have trouble breathing.  You suddenly feel very weak or dizzy. This information is not intended to replace advice given to you by your health care provider. Make sure you discuss any questions you have with your health care provider. Document Released: 09/24/2010 Document Revised: 11/05/2015 Document Reviewed: 02/10/2015 Elsevier Interactive Patient Education  2017 [employer]

## 2016-08-09 NOTE — Progress Notes (Signed)
Patient discharged in stable condition. Discharge packet and home instructions given to patient and wife. No concerns  At this time.
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
## 2016-08-10 LAB — TYPE AND SCREEN
ABO/RH(D): O POS
ANTIBODY SCREEN: NEGATIVE
UNIT DIVISION: 0
UNIT DIVISION: 0

## 2016-08-10 LAB — BPAM RBC
Blood Product Expiration Date: 201803212359
Blood Product Expiration Date: 201803222359
ISSUE DATE / TIME: 201802240804
Unit Type and Rh: 5100
Unit Type and Rh: 5100

## 2016-08-11 LAB — CULTURE, RESPIRATORY W GRAM STAIN: Culture: NORMAL

## 2016-08-11 LAB — CULTURE, RESPIRATORY

## 2016-08-18 DIAGNOSIS — G4700 Insomnia, unspecified: Secondary | ICD-10-CM | POA: Diagnosis not present

## 2016-08-18 DIAGNOSIS — M545 Low back pain: Secondary | ICD-10-CM | POA: Diagnosis not present

## 2016-08-30 DIAGNOSIS — M62838 Other muscle spasm: Secondary | ICD-10-CM | POA: Diagnosis not present

## 2016-08-30 DIAGNOSIS — L0390 Cellulitis, unspecified: Secondary | ICD-10-CM | POA: Diagnosis not present

## 2016-09-20 ENCOUNTER — Other Ambulatory Visit (HOSPITAL_COMMUNITY): Payer: Self-pay | Admitting: Interventional Radiology

## 2016-09-20 DIAGNOSIS — I872 Venous insufficiency (chronic) (peripheral): Secondary | ICD-10-CM

## 2016-10-17 DIAGNOSIS — Z23 Encounter for immunization: Secondary | ICD-10-CM | POA: Diagnosis not present

## 2016-11-11 NOTE — Addendum Note (Signed)
Addendum  created 11/11/16 1147 by Emlyn Maves, MD   Sign clinical note    

## 2016-11-13 ENCOUNTER — Emergency Department (HOSPITAL_COMMUNITY)
Admission: EM | Admit: 2016-11-13 | Discharge: 2016-11-13 | Disposition: A | Discharge status: Home / Self Care | Payer: Medicare Other | Attending: Emergency Medicine | Admitting: Emergency Medicine

## 2016-11-13 ENCOUNTER — Emergency Department (HOSPITAL_COMMUNITY): Payer: Medicare Other

## 2016-11-13 ENCOUNTER — Encounter (HOSPITAL_COMMUNITY): Payer: Self-pay

## 2016-11-13 DIAGNOSIS — Y92098 Other place in other non-institutional residence as the place of occurrence of the external cause: Secondary | ICD-10-CM | POA: Insufficient documentation

## 2016-11-13 DIAGNOSIS — Y999 Unspecified external cause status: Secondary | ICD-10-CM | POA: Diagnosis not present

## 2016-11-13 DIAGNOSIS — W01190A Fall on same level from slipping, tripping and stumbling with subsequent striking against furniture, initial encounter: Secondary | ICD-10-CM | POA: Insufficient documentation

## 2016-11-13 DIAGNOSIS — S20212A Contusion of left front wall of thorax, initial encounter: Secondary | ICD-10-CM | POA: Insufficient documentation

## 2016-11-13 DIAGNOSIS — Y9301 Activity, walking, marching and hiking: Secondary | ICD-10-CM | POA: Diagnosis not present

## 2016-11-13 DIAGNOSIS — S299XXA Unspecified injury of thorax, initial encounter: Secondary | ICD-10-CM | POA: Diagnosis present

## 2016-11-13 DIAGNOSIS — Z87891 Personal history of nicotine dependence: Secondary | ICD-10-CM | POA: Diagnosis not present

## 2016-11-13 DIAGNOSIS — R079 Chest pain, unspecified: Secondary | ICD-10-CM | POA: Diagnosis not present

## 2016-11-13 MED ORDER — IBUPROFEN 400 MG PO TABS
600.0000 mg | ORAL_TABLET | Freq: Once | ORAL | Status: AC
  Administered 2016-11-13: 600 mg via ORAL
  Filled 2016-11-13: qty 1

## 2016-11-13 MED ORDER — TRAMADOL HCL 50 MG PO TABS: 50.0000 mg | ORAL_TABLET | Freq: Four times a day (QID) | ORAL | 0 refills | Status: DC | PRN

## 2016-11-13 MED ORDER — OXYCODONE-ACETAMINOPHEN 5-325 MG PO TABS
1.0000 | ORAL_TABLET | Freq: Once | ORAL | Status: AC
  Administered 2016-11-13: 1 via ORAL
  Filled 2016-11-13: qty 1

## 2016-11-13 NOTE — ED Notes (Signed)
ED Provider at bedside. 
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
## 2016-12-21 DIAGNOSIS — Z79891 Long term (current) use of opiate analgesic: Secondary | ICD-10-CM | POA: Diagnosis not present

## 2016-12-23 NOTE — Addendum Note (Signed)
Addendum  created 12/23/16 1702 by Mayce Noyes, MD   Sign clinical note    

## 2016-12-23 NOTE — Anesthesia Postprocedure Evaluation (Signed)
Anesthesia Post Note  Patient: Bryan Powell  Procedure(s) Performed: Procedure(s) (LRB): EXPLORATORY LAPAROTOMY (N/A)     Patient location during evaluation: PACU Anesthesia Type: General Level of consciousness: awake and alert Pain management: pain level controlled Vital Signs Assessment: post-procedure vital signs reviewed and stable Respiratory status: spontaneous breathing, nonlabored ventilation, respiratory function stable and patient connected to nasal cannula oxygen Cardiovascular status: blood pressure returned to baseline and stable Postop Assessment: no signs of nausea or vomiting Anesthetic complications: no    Last Vitals:  Vitals:   08/08/16 2118 08/09/16 1330  BP: (!) 150/71 (!) 149/77  Pulse: 71 73  Resp: 19   Temp: 36.8 C 36.9 C    Last Pain:  Vitals:   08/09/16 1749  TempSrc:   PainSc: 3                  Azriella Mattia DAVID

## 2017-01-18 DIAGNOSIS — Z23 Encounter for immunization: Secondary | ICD-10-CM | POA: Diagnosis not present

## 2017-01-30 DIAGNOSIS — Z125 Encounter for screening for malignant neoplasm of prostate: Secondary | ICD-10-CM | POA: Diagnosis not present

## 2017-01-30 DIAGNOSIS — Z79891 Long term (current) use of opiate analgesic: Secondary | ICD-10-CM | POA: Diagnosis not present

## 2017-01-30 DIAGNOSIS — E7800 Pure hypercholesterolemia, unspecified: Secondary | ICD-10-CM | POA: Diagnosis not present

## 2017-02-01 DIAGNOSIS — Z5181 Encounter for therapeutic drug level monitoring: Secondary | ICD-10-CM | POA: Diagnosis not present

## 2017-02-01 DIAGNOSIS — D72829 Elevated white blood cell count, unspecified: Secondary | ICD-10-CM | POA: Diagnosis not present

## 2017-05-29 DIAGNOSIS — Z79891 Long term (current) use of opiate analgesic: Secondary | ICD-10-CM | POA: Diagnosis not present

## 2017-06-29 DIAGNOSIS — Z79891 Long term (current) use of opiate analgesic: Secondary | ICD-10-CM | POA: Diagnosis not present

## 2017-07-31 DIAGNOSIS — H5213 Myopia, bilateral: Secondary | ICD-10-CM | POA: Diagnosis not present

## 2017-07-31 DIAGNOSIS — K219 Gastro-esophageal reflux disease without esophagitis: Secondary | ICD-10-CM | POA: Diagnosis not present

## 2017-07-31 DIAGNOSIS — R042 Hemoptysis: Secondary | ICD-10-CM | POA: Diagnosis not present

## 2017-07-31 DIAGNOSIS — R040 Epistaxis: Secondary | ICD-10-CM | POA: Diagnosis not present

## 2017-08-31 DIAGNOSIS — K219 Gastro-esophageal reflux disease without esophagitis: Secondary | ICD-10-CM | POA: Diagnosis not present

## 2017-08-31 DIAGNOSIS — G894 Chronic pain syndrome: Secondary | ICD-10-CM | POA: Diagnosis not present

## 2017-08-31 DIAGNOSIS — G4700 Insomnia, unspecified: Secondary | ICD-10-CM | POA: Diagnosis not present

## 2017-08-31 DIAGNOSIS — F419 Anxiety disorder, unspecified: Secondary | ICD-10-CM | POA: Diagnosis not present

## 2017-08-31 DIAGNOSIS — L03119 Cellulitis of unspecified part of limb: Secondary | ICD-10-CM | POA: Diagnosis not present

## 2017-09-07 DIAGNOSIS — K219 Gastro-esophageal reflux disease without esophagitis: Secondary | ICD-10-CM | POA: Diagnosis not present

## 2017-09-07 DIAGNOSIS — R040 Epistaxis: Secondary | ICD-10-CM | POA: Diagnosis not present

## 2017-09-07 DIAGNOSIS — Z7289 Other problems related to lifestyle: Secondary | ICD-10-CM | POA: Diagnosis not present

## 2017-09-07 DIAGNOSIS — F17200 Nicotine dependence, unspecified, uncomplicated: Secondary | ICD-10-CM | POA: Diagnosis not present

## 2018-03-07 ENCOUNTER — Encounter: Payer: Self-pay | Admitting: Internal Medicine

## 2018-03-07 ENCOUNTER — Telehealth: Payer: Self-pay | Admitting: Internal Medicine

## 2018-03-07 DIAGNOSIS — I878 Other specified disorders of veins: Secondary | ICD-10-CM | POA: Insufficient documentation

## 2018-03-07 DIAGNOSIS — Z789 Other specified health status: Secondary | ICD-10-CM | POA: Insufficient documentation

## 2018-03-07 NOTE — Telephone Encounter (Signed)
Spoke to him in [facility] recovery - he would like to have a screening colonoscopy but has poor IV access  We have scheduled an appointment to review options  One of which would include IR to place IV using ultrasound prior to a colonoscopy at hospital

## 2018-04-23 ENCOUNTER — Ambulatory Visit: Payer: Medicare Other | Admitting: Internal Medicine
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
## 2018-11-13 ENCOUNTER — Emergency Department (HOSPITAL_COMMUNITY): Payer: Medicare HMO

## 2018-11-13 ENCOUNTER — Inpatient Hospital Stay (HOSPITAL_COMMUNITY)
Admission: EM | Admit: 2018-11-13 | Discharge: 2018-11-16 | DRG: 982 | Disposition: A | Discharge status: Home Health | Payer: Medicare HMO | Attending: Internal Medicine | Admitting: Internal Medicine

## 2018-11-13 ENCOUNTER — Encounter (HOSPITAL_COMMUNITY): Payer: Self-pay

## 2018-11-13 DIAGNOSIS — S82142A Displaced bicondylar fracture of left tibia, initial encounter for closed fracture: Secondary | ICD-10-CM

## 2018-11-13 DIAGNOSIS — Z9081 Acquired absence of spleen: Secondary | ICD-10-CM

## 2018-11-13 DIAGNOSIS — S82141A Displaced bicondylar fracture of right tibia, initial encounter for closed fracture: Secondary | ICD-10-CM | POA: Diagnosis present

## 2018-11-13 DIAGNOSIS — W11XXXA Fall on and from ladder, initial encounter: Secondary | ICD-10-CM | POA: Diagnosis present

## 2018-11-13 DIAGNOSIS — Z87891 Personal history of nicotine dependence: Secondary | ICD-10-CM

## 2018-11-13 DIAGNOSIS — F419 Anxiety disorder, unspecified: Secondary | ICD-10-CM | POA: Diagnosis present

## 2018-11-13 DIAGNOSIS — Z8249 Family history of ischemic heart disease and other diseases of the circulatory system: Secondary | ICD-10-CM

## 2018-11-13 DIAGNOSIS — Z79899 Other long term (current) drug therapy: Secondary | ICD-10-CM

## 2018-11-13 DIAGNOSIS — F1910 Other psychoactive substance abuse, uncomplicated: Secondary | ICD-10-CM | POA: Diagnosis present

## 2018-11-13 DIAGNOSIS — S82831A Other fracture of upper and lower end of right fibula, initial encounter for closed fracture: Secondary | ICD-10-CM | POA: Diagnosis present

## 2018-11-13 DIAGNOSIS — S82201A Unspecified fracture of shaft of right tibia, initial encounter for closed fracture: Secondary | ICD-10-CM | POA: Diagnosis present

## 2018-11-13 DIAGNOSIS — Y93H9 Activity, other involving exterior property and land maintenance, building and construction: Secondary | ICD-10-CM

## 2018-11-13 DIAGNOSIS — Z1159 Encounter for screening for other viral diseases: Secondary | ICD-10-CM

## 2018-11-13 DIAGNOSIS — K219 Gastro-esophageal reflux disease without esophagitis: Secondary | ICD-10-CM | POA: Diagnosis present

## 2018-11-13 DIAGNOSIS — Z8619 Personal history of other infectious and parasitic diseases: Secondary | ICD-10-CM

## 2018-11-13 DIAGNOSIS — I878 Other specified disorders of veins: Secondary | ICD-10-CM | POA: Diagnosis present

## 2018-11-13 DIAGNOSIS — F1020 Alcohol dependence, uncomplicated: Secondary | ICD-10-CM | POA: Diagnosis present

## 2018-11-13 DIAGNOSIS — Y92018 Other place in single-family (private) house as the place of occurrence of the external cause: Secondary | ICD-10-CM

## 2018-11-13 DIAGNOSIS — S2241XA Multiple fractures of ribs, right side, initial encounter for closed fracture: Secondary | ICD-10-CM | POA: Diagnosis not present

## 2018-11-13 DIAGNOSIS — R402412 Glasgow coma scale score 13-15, at arrival to emergency department: Secondary | ICD-10-CM | POA: Diagnosis present

## 2018-11-13 DIAGNOSIS — Z419 Encounter for procedure for purposes other than remedying health state, unspecified: Secondary | ICD-10-CM

## 2018-11-13 DIAGNOSIS — Z823 Family history of stroke: Secondary | ICD-10-CM

## 2018-11-13 DIAGNOSIS — F1110 Opioid abuse, uncomplicated: Secondary | ICD-10-CM | POA: Diagnosis present

## 2018-11-13 DIAGNOSIS — D62 Acute posthemorrhagic anemia: Secondary | ICD-10-CM | POA: Diagnosis not present

## 2018-11-13 MED ORDER — HYDROMORPHONE HCL 1 MG/ML IJ SOLN
0.5000 mg | Freq: Once | INTRAMUSCULAR | Status: AC
  Administered 2018-11-14: 0.5 mg via INTRAMUSCULAR
  Filled 2018-11-13: qty 1

## 2018-11-13 NOTE — ED Triage Notes (Signed)
Per [facility], pt from home after Pt had 10 foot fall onto gravel, now having right knee pain. Pt was ontop of house cleaning out gutters. NO obvious deformity. When standing pt had severe pain to right knee. Has known cellulitis to right leg. Denies neck or back pain. VSS. 137/64, HR 80, RR 18, 98% on RA. GCS 15.

## 2018-11-14 ENCOUNTER — Encounter (HOSPITAL_COMMUNITY): Payer: Self-pay | Admitting: Internal Medicine

## 2018-11-14 ENCOUNTER — Emergency Department (HOSPITAL_COMMUNITY): Payer: Medicare HMO

## 2018-11-14 ENCOUNTER — Inpatient Hospital Stay (HOSPITAL_COMMUNITY): Payer: Medicare HMO | Admitting: Certified Registered Nurse Anesthetist

## 2018-11-14 ENCOUNTER — Encounter (HOSPITAL_COMMUNITY)
Admission: EM | Disposition: A | Discharge status: Home Health | Payer: Self-pay | Source: Home / Self Care | Attending: Internal Medicine

## 2018-11-14 ENCOUNTER — Inpatient Hospital Stay (HOSPITAL_COMMUNITY): Payer: Medicare HMO

## 2018-11-14 DIAGNOSIS — R402412 Glasgow coma scale score 13-15, at arrival to emergency department: Secondary | ICD-10-CM | POA: Diagnosis present

## 2018-11-14 DIAGNOSIS — F1020 Alcohol dependence, uncomplicated: Secondary | ICD-10-CM | POA: Diagnosis present

## 2018-11-14 DIAGNOSIS — K219 Gastro-esophageal reflux disease without esophagitis: Secondary | ICD-10-CM | POA: Diagnosis present

## 2018-11-14 DIAGNOSIS — W11XXXA Fall on and from ladder, initial encounter: Secondary | ICD-10-CM | POA: Diagnosis present

## 2018-11-14 DIAGNOSIS — S82141A Displaced bicondylar fracture of right tibia, initial encounter for closed fracture: Secondary | ICD-10-CM | POA: Diagnosis present

## 2018-11-14 DIAGNOSIS — F419 Anxiety disorder, unspecified: Secondary | ICD-10-CM | POA: Diagnosis present

## 2018-11-14 DIAGNOSIS — S82831A Other fracture of upper and lower end of right fibula, initial encounter for closed fracture: Secondary | ICD-10-CM | POA: Diagnosis present

## 2018-11-14 DIAGNOSIS — Y92018 Other place in single-family (private) house as the place of occurrence of the external cause: Secondary | ICD-10-CM | POA: Diagnosis not present

## 2018-11-14 DIAGNOSIS — S2241XA Multiple fractures of ribs, right side, initial encounter for closed fracture: Secondary | ICD-10-CM | POA: Diagnosis present

## 2018-11-14 DIAGNOSIS — Z8249 Family history of ischemic heart disease and other diseases of the circulatory system: Secondary | ICD-10-CM | POA: Diagnosis not present

## 2018-11-14 DIAGNOSIS — S82201A Unspecified fracture of shaft of right tibia, initial encounter for closed fracture: Secondary | ICD-10-CM | POA: Diagnosis present

## 2018-11-14 DIAGNOSIS — I878 Other specified disorders of veins: Secondary | ICD-10-CM | POA: Diagnosis present

## 2018-11-14 DIAGNOSIS — Z8619 Personal history of other infectious and parasitic diseases: Secondary | ICD-10-CM | POA: Diagnosis not present

## 2018-11-14 DIAGNOSIS — Z1159 Encounter for screening for other viral diseases: Secondary | ICD-10-CM | POA: Diagnosis not present

## 2018-11-14 DIAGNOSIS — F1110 Opioid abuse, uncomplicated: Secondary | ICD-10-CM | POA: Diagnosis present

## 2018-11-14 DIAGNOSIS — Z823 Family history of stroke: Secondary | ICD-10-CM | POA: Diagnosis not present

## 2018-11-14 DIAGNOSIS — F1910 Other psychoactive substance abuse, uncomplicated: Secondary | ICD-10-CM | POA: Diagnosis not present

## 2018-11-14 DIAGNOSIS — D62 Acute posthemorrhagic anemia: Secondary | ICD-10-CM | POA: Diagnosis not present

## 2018-11-14 DIAGNOSIS — Z9081 Acquired absence of spleen: Secondary | ICD-10-CM | POA: Diagnosis not present

## 2018-11-14 DIAGNOSIS — Y93H9 Activity, other involving exterior property and land maintenance, building and construction: Secondary | ICD-10-CM | POA: Diagnosis not present

## 2018-11-14 DIAGNOSIS — Z79899 Other long term (current) drug therapy: Secondary | ICD-10-CM | POA: Diagnosis not present

## 2018-11-14 DIAGNOSIS — Z87891 Personal history of nicotine dependence: Secondary | ICD-10-CM | POA: Diagnosis not present

## 2018-11-14 HISTORY — PX: ORIF TIBIA PLATEAU: SHX2132

## 2018-11-14 LAB — CBC WITH DIFFERENTIAL/PLATELET
Abs Immature Granulocytes: 0.04 10*3/uL (ref 0.00–0.07)
Basophils Absolute: 0 10*3/uL (ref 0.0–0.1)
Basophils Relative: 0 %
Eosinophils Absolute: 0 10*3/uL (ref 0.0–0.5)
Eosinophils Relative: 0 %
HCT: 39.6 % (ref 39.0–52.0)
Hemoglobin: 13.1 g/dL (ref 13.0–17.0)
Immature Granulocytes: 0 %
Lymphocytes Relative: 23 %
Lymphs Abs: 3.2 10*3/uL (ref 0.7–4.0)
MCH: 27.8 pg (ref 26.0–34.0)
MCHC: 33.1 g/dL (ref 30.0–36.0)
MCV: 83.9 fL (ref 80.0–100.0)
Monocytes Absolute: 1.3 10*3/uL — ABNORMAL HIGH (ref 0.1–1.0)
Monocytes Relative: 10 %
Neutro Abs: 8.9 10*3/uL — ABNORMAL HIGH (ref 1.7–7.7)
Neutrophils Relative %: 67 %
Platelets: 259 10*3/uL (ref 150–400)
RBC: 4.72 MIL/uL (ref 4.22–5.81)
RDW: 14.5 % (ref 11.5–15.5)
WBC: 13.5 10*3/uL — ABNORMAL HIGH (ref 4.0–10.5)
nRBC: 0 % (ref 0.0–0.2)

## 2018-11-14 LAB — RAPID URINE DRUG SCREEN, HOSP PERFORMED
Amphetamines: NOT DETECTED
Barbiturates: NOT DETECTED
Benzodiazepines: POSITIVE — AB
Cocaine: NOT DETECTED
Opiates: POSITIVE — AB
Tetrahydrocannabinol: NOT DETECTED

## 2018-11-14 LAB — BASIC METABOLIC PANEL
Anion gap: 11 (ref 5–15)
BUN: 10 mg/dL (ref 8–23)
CO2: 24 mmol/L (ref 22–32)
Calcium: 9.1 mg/dL (ref 8.9–10.3)
Chloride: 101 mmol/L (ref 98–111)
Creatinine, Ser: 0.68 mg/dL (ref 0.61–1.24)
GFR calc Af Amer: >60 mL/min (ref >60)
GFR calc non Af Amer: >60 mL/min (ref >60)
Glucose, Bld: 111 mg/dL — ABNORMAL HIGH (ref 70–99)
Potassium: 3.9 mmol/L (ref 3.5–5.1)
Sodium: 136 mmol/L (ref 135–145)

## 2018-11-14 LAB — CBG MONITORING, ED: Glucose-Capillary: 122 mg/dL — ABNORMAL HIGH (ref 70–99)

## 2018-11-14 LAB — PROTIME-INR
INR: 1.2 (ref 0.8–1.2)
Prothrombin Time: 14.6 seconds (ref 11.4–15.2)

## 2018-11-14 LAB — SARS CORONAVIRUS 2 BY RT PCR (HOSPITAL ORDER, PERFORMED IN ~~LOC~~ HOSPITAL LAB): SARS Coronavirus 2: NEGATIVE

## 2018-11-14 LAB — GLUCOSE, CAPILLARY: Glucose-Capillary: 124 mg/dL — ABNORMAL HIGH (ref 70–99)

## 2018-11-14 LAB — ETHANOL: Alcohol, Ethyl (B): <10 mg/dL (ref <10)

## 2018-11-14 LAB — TYPE AND SCREEN
ABO/RH(D): O POS
Antibody Screen: NEGATIVE

## 2018-11-14 LAB — HIV ANTIBODY (ROUTINE TESTING W REFLEX): HIV Screen 4th Generation wRfx: NONREACTIVE

## 2018-11-14 SURGERY — OPEN REDUCTION INTERNAL FIXATION (ORIF) TIBIAL PLATEAU
Anesthesia: General | Laterality: Right

## 2018-11-14 MED ORDER — BACITRACIN ZINC 500 UNIT/GM EX OINT
TOPICAL_OINTMENT | CUTANEOUS | Status: AC
  Filled 2018-11-14: qty 28.35

## 2018-11-14 MED ORDER — LIDOCAINE 2% (20 MG/ML) 5 ML SYRINGE
INTRAMUSCULAR | Status: DC | PRN
  Administered 2018-11-14: 100 mg via INTRAVENOUS

## 2018-11-14 MED ORDER — MORPHINE SULFATE (PF) 2 MG/ML IV SOLN: 2.0000 mg | INTRAVENOUS | Status: DC | PRN

## 2018-11-14 MED ORDER — DEXAMETHASONE SODIUM PHOSPHATE 10 MG/ML IJ SOLN
INTRAMUSCULAR | Status: DC | PRN
  Administered 2018-11-14: 10 mg via INTRAVENOUS

## 2018-11-14 MED ORDER — PROPOFOL 10 MG/ML IV BOLUS
INTRAVENOUS | Status: AC
  Filled 2018-11-14: qty 40

## 2018-11-14 MED ORDER — ENOXAPARIN SODIUM 40 MG/0.4ML ~~LOC~~ SOLN
40.0000 mg | SUBCUTANEOUS | Status: DC
  Administered 2018-11-15 – 2018-11-16 (×2): 40 mg via SUBCUTANEOUS
  Filled 2018-11-14 (×2): qty 0.4

## 2018-11-14 MED ORDER — HYDROMORPHONE HCL 1 MG/ML IJ SOLN
INTRAMUSCULAR | Status: AC
  Administered 2018-11-14: 12:00:00 0.5 mg via INTRAVENOUS
  Filled 2018-11-14: qty 1

## 2018-11-14 MED ORDER — DEXAMETHASONE SODIUM PHOSPHATE 10 MG/ML IJ SOLN
INTRAMUSCULAR | Status: AC
  Filled 2018-11-14: qty 1

## 2018-11-14 MED ORDER — ACETAMINOPHEN 10 MG/ML IV SOLN
INTRAVENOUS | Status: AC
  Administered 2018-11-14: 1000 mg via INTRAVENOUS
  Filled 2018-11-14: qty 100

## 2018-11-14 MED ORDER — HYDROMORPHONE HCL 1 MG/ML IJ SOLN: 1.0000 mg | INTRAMUSCULAR | Status: DC

## 2018-11-14 MED ORDER — PROPOFOL 10 MG/ML IV BOLUS
INTRAVENOUS | Status: DC | PRN
  Administered 2018-11-14: 160 mg via INTRAVENOUS

## 2018-11-14 MED ORDER — KETAMINE HCL 10 MG/ML IJ SOLN
INTRAMUSCULAR | Status: DC | PRN
  Administered 2018-11-14: 50 mg via INTRAVENOUS

## 2018-11-14 MED ORDER — HYDROMORPHONE HCL 1 MG/ML IJ SOLN
INTRAMUSCULAR | Status: AC
  Administered 2018-11-14: 14:00:00 1 mg via INTRAVENOUS
  Filled 2018-11-14: qty 1

## 2018-11-14 MED ORDER — METHOCARBAMOL 500 MG PO TABS
ORAL_TABLET | ORAL | Status: AC
  Administered 2018-11-14: 500 mg
  Filled 2018-11-14: qty 1

## 2018-11-14 MED ORDER — LORAZEPAM 1 MG PO TABS
1.0000 mg | ORAL_TABLET | Freq: Four times a day (QID) | ORAL | Status: DC | PRN
  Administered 2018-11-15: 1 mg via ORAL
  Filled 2018-11-14: qty 1

## 2018-11-14 MED ORDER — VANCOMYCIN HCL 1000 MG IV SOLR
INTRAVENOUS | Status: AC
  Filled 2018-11-14: qty 1000

## 2018-11-14 MED ORDER — SODIUM CHLORIDE 0.9 % IV SOLN
INTRAVENOUS | Status: AC
  Administered 2018-11-14 (×2): via INTRAVENOUS

## 2018-11-14 MED ORDER — TOBRAMYCIN POWD
Status: DC | PRN
  Administered 2018-11-14: 1200 mg

## 2018-11-14 MED ORDER — ROCURONIUM BROMIDE 10 MG/ML (PF) SYRINGE
PREFILLED_SYRINGE | INTRAVENOUS | Status: AC
  Filled 2018-11-14: qty 10

## 2018-11-14 MED ORDER — METHOCARBAMOL 500 MG PO TABS
500.0000 mg | ORAL_TABLET | Freq: Four times a day (QID) | ORAL | Status: DC | PRN
  Administered 2018-11-14 – 2018-11-16 (×6): 500 mg via ORAL
  Filled 2018-11-14 (×6): qty 1

## 2018-11-14 MED ORDER — MIDAZOLAM HCL 5 MG/5ML IJ SOLN
INTRAMUSCULAR | Status: DC | PRN
  Administered 2018-11-14: 2 mg via INTRAVENOUS

## 2018-11-14 MED ORDER — ONDANSETRON 4 MG PO TBDP: 8.0000 mg | ORAL_TABLET | Freq: Once | ORAL | Status: DC

## 2018-11-14 MED ORDER — HYDROMORPHONE HCL 1 MG/ML IJ SOLN
1.0000 mg | INTRAMUSCULAR | Status: DC | PRN
  Administered 2018-11-14: 1 mg via INTRAVENOUS
  Filled 2018-11-14: qty 1

## 2018-11-14 MED ORDER — SUCCINYLCHOLINE CHLORIDE 200 MG/10ML IV SOSY
PREFILLED_SYRINGE | INTRAVENOUS | Status: AC
  Filled 2018-11-14: qty 10

## 2018-11-14 MED ORDER — OXYCODONE HCL 5 MG PO TABS
5.0000 mg | ORAL_TABLET | ORAL | Status: DC | PRN
  Administered 2018-11-14 – 2018-11-16 (×10): 10 mg via ORAL
  Filled 2018-11-14 (×9): qty 2

## 2018-11-14 MED ORDER — GABAPENTIN 100 MG PO CAPS
100.0000 mg | ORAL_CAPSULE | Freq: Three times a day (TID) | ORAL | Status: DC
  Administered 2018-11-14 – 2018-11-16 (×6): 100 mg via ORAL
  Filled 2018-11-14 (×6): qty 1

## 2018-11-14 MED ORDER — ONDANSETRON HCL 4 MG/2ML IJ SOLN
INTRAMUSCULAR | Status: AC
  Filled 2018-11-14: qty 2

## 2018-11-14 MED ORDER — 0.9 % SODIUM CHLORIDE (POUR BTL) OPTIME
TOPICAL | Status: DC | PRN
  Administered 2018-11-14: 1000 mL

## 2018-11-14 MED ORDER — VITAMIN B-1 100 MG PO TABS
100.0000 mg | ORAL_TABLET | Freq: Every day | ORAL | Status: DC
  Administered 2018-11-15 – 2018-11-16 (×2): 100 mg via ORAL
  Filled 2018-11-14 (×2): qty 1

## 2018-11-14 MED ORDER — LORAZEPAM 2 MG/ML IJ SOLN
0.0000 mg | Freq: Two times a day (BID) | INTRAMUSCULAR | Status: DC
  Filled 2018-11-14: qty 1

## 2018-11-14 MED ORDER — ESOMEPRAZOLE MAGNESIUM 20 MG PO CPDR
20.0000 mg | DELAYED_RELEASE_CAPSULE | Freq: Every day | ORAL | Status: DC
  Administered 2018-11-15 – 2018-11-16 (×2): 20 mg via ORAL
  Filled 2018-11-14 (×2): qty 1

## 2018-11-14 MED ORDER — KETAMINE HCL 50 MG/ML IJ SOLN
0.3600 mg/kg | Freq: Once | INTRAMUSCULAR | Status: AC
  Administered 2018-11-14: 25 mg via INTRAMUSCULAR
  Filled 2018-11-14: qty 10

## 2018-11-14 MED ORDER — HYDROMORPHONE HCL 1 MG/ML IJ SOLN
0.2500 mg | INTRAMUSCULAR | Status: DC | PRN
  Administered 2018-11-14 (×2): 0.5 mg via INTRAVENOUS

## 2018-11-14 MED ORDER — LORAZEPAM 1 MG PO TABS
0.0000 mg | ORAL_TABLET | Freq: Four times a day (QID) | ORAL | Status: DC
  Administered 2018-11-14: 2 mg via ORAL
  Filled 2018-11-14: qty 2

## 2018-11-14 MED ORDER — FENTANYL CITRATE (PF) 250 MCG/5ML IJ SOLN
INTRAMUSCULAR | Status: AC
  Filled 2018-11-14: qty 5

## 2018-11-14 MED ORDER — FENTANYL CITRATE (PF) 100 MCG/2ML IJ SOLN
INTRAMUSCULAR | Status: DC | PRN
  Administered 2018-11-14 (×5): 50 ug via INTRAVENOUS
  Administered 2018-11-14: 100 ug via INTRAVENOUS
  Administered 2018-11-14 (×3): 50 ug via INTRAVENOUS

## 2018-11-14 MED ORDER — CEFAZOLIN SODIUM 1 G IJ SOLR
INTRAMUSCULAR | Status: AC
  Filled 2018-11-14: qty 20

## 2018-11-14 MED ORDER — ONDANSETRON HCL 4 MG/2ML IJ SOLN
INTRAMUSCULAR | Status: DC | PRN
  Administered 2018-11-14: 4 mg via INTRAVENOUS

## 2018-11-14 MED ORDER — VANCOMYCIN HCL 1000 MG IV SOLR
INTRAVENOUS | Status: DC | PRN
  Administered 2018-11-14: 1000 mg

## 2018-11-14 MED ORDER — MEPERIDINE HCL 25 MG/ML IJ SOLN: 6.2500 mg | INTRAMUSCULAR | Status: DC | PRN

## 2018-11-14 MED ORDER — ROCURONIUM BROMIDE 10 MG/ML (PF) SYRINGE
PREFILLED_SYRINGE | INTRAVENOUS | Status: AC
  Filled 2018-11-14: qty 20

## 2018-11-14 MED ORDER — ONDANSETRON HCL 4 MG/2ML IJ SOLN
4.0000 mg | Freq: Once | INTRAMUSCULAR | Status: AC
  Administered 2018-11-14: 4 mg via INTRAVENOUS
  Filled 2018-11-14: qty 2

## 2018-11-14 MED ORDER — KETAMINE HCL 50 MG/5ML IJ SOSY
PREFILLED_SYRINGE | INTRAMUSCULAR | Status: AC
  Filled 2018-11-14: qty 10

## 2018-11-14 MED ORDER — LIDOCAINE 2% (20 MG/ML) 5 ML SYRINGE
INTRAMUSCULAR | Status: AC
  Filled 2018-11-14: qty 5

## 2018-11-14 MED ORDER — THIAMINE HCL 100 MG/ML IJ SOLN: 100.0000 mg | Freq: Every day | INTRAMUSCULAR | Status: DC

## 2018-11-14 MED ORDER — CEFAZOLIN SODIUM-DEXTROSE 2-4 GM/100ML-% IV SOLN
2.0000 g | Freq: Three times a day (TID) | INTRAVENOUS | Status: AC
  Administered 2018-11-14 – 2018-11-15 (×3): 2 g via INTRAVENOUS
  Filled 2018-11-14 (×3): qty 100

## 2018-11-14 MED ORDER — LORAZEPAM 1 MG PO TABS: 0.0000 mg | ORAL_TABLET | Freq: Two times a day (BID) | ORAL | Status: DC

## 2018-11-14 MED ORDER — ONDANSETRON HCL 4 MG/2ML IJ SOLN: 4.0000 mg | Freq: Once | INTRAMUSCULAR | Status: DC | PRN

## 2018-11-14 MED ORDER — VITAMIN B-1 100 MG PO TABS: 100.0000 mg | ORAL_TABLET | Freq: Every day | ORAL | Status: DC

## 2018-11-14 MED ORDER — MIDAZOLAM HCL 2 MG/2ML IJ SOLN
INTRAMUSCULAR | Status: AC
  Filled 2018-11-14: qty 2

## 2018-11-14 MED ORDER — LORAZEPAM 2 MG/ML IJ SOLN: 0.0000 mg | Freq: Four times a day (QID) | INTRAMUSCULAR | Status: DC

## 2018-11-14 MED ORDER — LORAZEPAM 2 MG/ML IJ SOLN
0.0000 mg | Freq: Four times a day (QID) | INTRAMUSCULAR | Status: AC
  Administered 2018-11-14: 4 mg via INTRAVENOUS
  Administered 2018-11-14: 2 mg via INTRAVENOUS
  Administered 2018-11-14 – 2018-11-15 (×2): 1 mg via INTRAVENOUS
  Administered 2018-11-15: 2 mg via INTRAVENOUS
  Administered 2018-11-15: 4 mg via INTRAVENOUS
  Administered 2018-11-15 – 2018-11-16 (×2): 2 mg via INTRAVENOUS
  Filled 2018-11-14 (×4): qty 1
  Filled 2018-11-14: qty 2
  Filled 2018-11-14: qty 1
  Filled 2018-11-14: qty 2

## 2018-11-14 MED ORDER — ROCURONIUM BROMIDE 100 MG/10ML IV SOLN
INTRAVENOUS | Status: DC | PRN
  Administered 2018-11-14: 50 mg via INTRAVENOUS
  Administered 2018-11-14: 10 mg via INTRAVENOUS
  Administered 2018-11-14 (×2): 20 mg via INTRAVENOUS

## 2018-11-14 MED ORDER — CEFAZOLIN SODIUM-DEXTROSE 2-3 GM-%(50ML) IV SOLR
INTRAVENOUS | Status: DC | PRN
  Administered 2018-11-14: 2 g via INTRAVENOUS

## 2018-11-14 MED ORDER — TOBRAMYCIN SULFATE 1.2 G IJ SOLR
INTRAMUSCULAR | Status: AC
  Filled 2018-11-14: qty 1.2

## 2018-11-14 MED ORDER — SODIUM CHLORIDE 0.9% FLUSH: 10.0000 mL | INTRAVENOUS | Status: DC | PRN

## 2018-11-14 MED ORDER — SUCCINYLCHOLINE CHLORIDE 200 MG/10ML IV SOSY
PREFILLED_SYRINGE | INTRAVENOUS | Status: DC | PRN
  Administered 2018-11-14: 120 mg via INTRAVENOUS

## 2018-11-14 MED ORDER — ADULT MULTIVITAMIN W/MINERALS CH
1.0000 | ORAL_TABLET | Freq: Every day | ORAL | Status: DC
  Administered 2018-11-15 – 2018-11-16 (×2): 1 via ORAL
  Filled 2018-11-14 (×2): qty 1

## 2018-11-14 MED ORDER — NON FORMULARY: 20.0000 mg | Freq: Every day | Status: DC

## 2018-11-14 MED ORDER — HYDROCODONE-ACETAMINOPHEN 7.5-325 MG PO TABS: 1.0000 | ORAL_TABLET | Freq: Once | ORAL | Status: DC | PRN

## 2018-11-14 MED ORDER — OXYCODONE HCL 5 MG PO TABS
ORAL_TABLET | ORAL | Status: AC
  Administered 2018-11-14: 10 mg via ORAL
  Filled 2018-11-14: qty 2

## 2018-11-14 MED ORDER — LORAZEPAM 2 MG/ML IJ SOLN: 1.0000 mg | Freq: Four times a day (QID) | INTRAMUSCULAR | Status: DC | PRN

## 2018-11-14 MED ORDER — HYDROMORPHONE HCL 1 MG/ML IJ SOLN
1.0000 mg | INTRAMUSCULAR | Status: DC | PRN
  Administered 2018-11-14 – 2018-11-16 (×14): 1 mg via INTRAVENOUS
  Filled 2018-11-14 (×15): qty 1

## 2018-11-14 MED ORDER — SODIUM CHLORIDE 0.9% FLUSH
10.0000 mL | Freq: Two times a day (BID) | INTRAVENOUS | Status: DC
  Administered 2018-11-14: 10 mL
  Administered 2018-11-14: 04:00:00 40 mL
  Administered 2018-11-15 – 2018-11-16 (×3): 10 mL

## 2018-11-14 MED ORDER — ACETAMINOPHEN 10 MG/ML IV SOLN
1000.0000 mg | Freq: Once | INTRAVENOUS | Status: DC | PRN
  Administered 2018-11-14: 1000 mg via INTRAVENOUS

## 2018-11-14 MED ORDER — DEXMEDETOMIDINE HCL IN NACL 200 MCG/50ML IV SOLN
INTRAVENOUS | Status: DC | PRN
  Administered 2018-11-14 (×4): 20 ug via INTRAVENOUS

## 2018-11-14 MED ORDER — FOLIC ACID 1 MG PO TABS
1.0000 mg | ORAL_TABLET | Freq: Every day | ORAL | Status: DC
  Administered 2018-11-15 – 2018-11-16 (×2): 1 mg via ORAL
  Filled 2018-11-14 (×2): qty 1

## 2018-11-14 MED ORDER — LACTATED RINGERS IV SOLN
INTRAVENOUS | Status: DC | PRN
  Administered 2018-11-14 (×2): via INTRAVENOUS

## 2018-11-14 MED ORDER — ACETAMINOPHEN 325 MG PO TABS
650.0000 mg | ORAL_TABLET | Freq: Four times a day (QID) | ORAL | Status: DC
  Administered 2018-11-14 – 2018-11-16 (×7): 650 mg via ORAL
  Filled 2018-11-14 (×7): qty 2

## 2018-11-14 MED ORDER — HYDROMORPHONE HCL 1 MG/ML IJ SOLN
1.0000 mg | Freq: Once | INTRAMUSCULAR | Status: AC
  Administered 2018-11-14: 1 mg via INTRAMUSCULAR
  Filled 2018-11-14: qty 1

## 2018-11-14 MED ORDER — LORAZEPAM 2 MG/ML IJ SOLN: 0.0000 mg | Freq: Two times a day (BID) | INTRAMUSCULAR | Status: DC

## 2018-11-14 MED ORDER — SUGAMMADEX SODIUM 200 MG/2ML IV SOLN
INTRAVENOUS | Status: DC | PRN
  Administered 2018-11-14: 200 mg via INTRAVENOUS

## 2018-11-14 SURGICAL SUPPLY — 86 items
BANDAGE ACE 4X5 VEL STRL LF (GAUZE/BANDAGES/DRESSINGS) ×3 IMPLANT
BANDAGE ACE 6X5 VEL STRL LF (GAUZE/BANDAGES/DRESSINGS) ×3 IMPLANT
BANDAGE ESMARK 6X9 LF (GAUZE/BANDAGES/DRESSINGS) ×1 IMPLANT
BIT DRILL 2.5 X LONG (BIT) ×1
BIT DRILL CALIBR QC 2.8X250 (BIT) ×2 IMPLANT
BIT DRILL QC 3.5X110 (BIT) ×2 IMPLANT
BIT DRILL X LONG 2.5 (BIT) IMPLANT
BLADE CLIPPER SURG (BLADE) IMPLANT
BLADE SURG 15 STRL LF DISP TIS (BLADE) ×1 IMPLANT
BLADE SURG 15 STRL SS (BLADE) ×3
BNDG CMPR 9X6 STRL LF SNTH (GAUZE/BANDAGES/DRESSINGS) ×1
BNDG CMPR MED 15X6 ELC VLCR LF (GAUZE/BANDAGES/DRESSINGS) ×1
BNDG ELASTIC 6X15 VLCR STRL LF (GAUZE/BANDAGES/DRESSINGS) ×2 IMPLANT
BNDG ESMARK 6X9 LF (GAUZE/BANDAGES/DRESSINGS) ×3
BNDG GAUZE ELAST 4 BULKY (GAUZE/BANDAGES/DRESSINGS) ×3 IMPLANT
BRUSH SCRUB SURG 4.25 DISP (MISCELLANEOUS) ×6 IMPLANT
CANISTER SUCT 3000ML PPV (MISCELLANEOUS) ×3 IMPLANT
CANISTER WOUND CARE 500ML ATS (WOUND CARE) ×2 IMPLANT
CHLORAPREP W/TINT 26ML (MISCELLANEOUS) ×6 IMPLANT
COVER SURGICAL LIGHT HANDLE (MISCELLANEOUS) ×3 IMPLANT
COVER WAND RF STERILE (DRAPES) ×3 IMPLANT
CUFF TOURNIQUET SINGLE 34IN LL (TOURNIQUET CUFF) ×3 IMPLANT
DRAPE C-ARM 42X72 X-RAY (DRAPES) ×3 IMPLANT
DRAPE C-ARMOR (DRAPES) ×3 IMPLANT
DRAPE ORTHO SPLIT 77X108 STRL (DRAPES) ×6
DRAPE SURG ORHT 6 SPLT 77X108 (DRAPES) ×2 IMPLANT
DRAPE U-SHAPE 47X51 STRL (DRAPES) ×5 IMPLANT
DRILL BIT X LONG 2.5 (BIT) ×3
DRSG ADAPTIC 3X8 NADH LF (GAUZE/BANDAGES/DRESSINGS) ×2 IMPLANT
DRSG PAD ABDOMINAL 8X10 ST (GAUZE/BANDAGES/DRESSINGS) ×6 IMPLANT
ELECT REM PT RETURN 9FT ADLT (ELECTROSURGICAL) ×3
ELECTRODE REM PT RTRN 9FT ADLT (ELECTROSURGICAL) ×1 IMPLANT
GAUZE SPONGE 4X4 12PLY STRL (GAUZE/BANDAGES/DRESSINGS) ×3 IMPLANT
GLOVE BIO SURGEON STRL SZ 6.5 (GLOVE) ×6 IMPLANT
GLOVE BIO SURGEON STRL SZ7.5 (GLOVE) ×12 IMPLANT
GLOVE BIO SURGEONS STRL SZ 6.5 (GLOVE) ×3
GLOVE BIOGEL M 8.0 STRL (GLOVE) ×2 IMPLANT
GLOVE BIOGEL PI IND STRL 6.5 (GLOVE) ×1 IMPLANT
GLOVE BIOGEL PI IND STRL 7.5 (GLOVE) ×1 IMPLANT
GLOVE BIOGEL PI IND STRL 8 (GLOVE) IMPLANT
GLOVE BIOGEL PI INDICATOR 6.5 (GLOVE) ×2
GLOVE BIOGEL PI INDICATOR 7.5 (GLOVE) ×2
GLOVE BIOGEL PI INDICATOR 8 (GLOVE) ×2
GOWN STRL REUS W/ TWL LRG LVL3 (GOWN DISPOSABLE) ×2 IMPLANT
GOWN STRL REUS W/ TWL XL LVL3 (GOWN DISPOSABLE) IMPLANT
GOWN STRL REUS W/TWL LRG LVL3 (GOWN DISPOSABLE) ×6
GOWN STRL REUS W/TWL XL LVL3 (GOWN DISPOSABLE) ×3
IMMOBILIZER KNEE 22 UNIV (SOFTGOODS) ×1 IMPLANT
KIT BASIN OR (CUSTOM PROCEDURE TRAY) ×3 IMPLANT
KIT PREVENA INCISION MGT 13 (CANNISTER) ×2 IMPLANT
KIT TURNOVER KIT B (KITS) ×3 IMPLANT
NDL SUT 6 .5 CRC .975X.05 MAYO (NEEDLE) ×1 IMPLANT
NEEDLE MAYO TAPER (NEEDLE) ×3
NS IRRIG 1000ML POUR BTL (IV SOLUTION) ×3 IMPLANT
PACK TOTAL JOINT (CUSTOM PROCEDURE TRAY) ×3 IMPLANT
PAD ARMBOARD 7.5X6 YLW CONV (MISCELLANEOUS) ×6 IMPLANT
PAD CAST 4YDX4 CTTN HI CHSV (CAST SUPPLIES) ×1 IMPLANT
PADDING CAST COTTON 4X4 STRL (CAST SUPPLIES) ×3
PADDING CAST COTTON 6X4 STRL (CAST SUPPLIES) ×3 IMPLANT
PLATE PROX TIBIA LCP VA 3.5 (Plate) ×2 IMPLANT
PREVENA INCISION MGT 90 150 (MISCELLANEOUS) ×2 IMPLANT
SCREW CORTEX 3.5 60MM (Screw) ×2 IMPLANT
SCREW HEADED ST 3.5X34 (Screw) ×2 IMPLANT
SCREW HEADED ST 3.5X36 (Screw) ×6 IMPLANT
SCREW HEADED ST 3.5X80 (Screw) ×2 IMPLANT
SCREW HEADED ST 3.5X85 (Screw) ×2 IMPLANT
SCREW LOCKING 3.5X80MM VA (Screw) ×4 IMPLANT
SCREW LOCKING VA 3.5X85MM (Screw) ×2 IMPLANT
STAPLER VISISTAT 35W (STAPLE) ×3 IMPLANT
SUCTION FRAZIER HANDLE 10FR (MISCELLANEOUS) ×2
SUCTION TUBE FRAZIER 10FR DISP (MISCELLANEOUS) ×1 IMPLANT
SUT ETHILON 2 0 FS 18 (SUTURE) ×3 IMPLANT
SUT ETHILON 3 0 PS 1 (SUTURE) ×4 IMPLANT
SUT FIBERWIRE #2 38 T-5 BLUE (SUTURE) ×6
SUT VIC AB 0 CT1 27 (SUTURE)
SUT VIC AB 0 CT1 27XBRD ANBCTR (SUTURE) IMPLANT
SUT VIC AB 1 CT1 18XCR BRD 8 (SUTURE) IMPLANT
SUT VIC AB 1 CT1 27 (SUTURE) ×3
SUT VIC AB 1 CT1 27XBRD ANBCTR (SUTURE) ×1 IMPLANT
SUT VIC AB 1 CT1 8-18 (SUTURE)
SUT VIC AB 2-0 CT1 27 (SUTURE) ×6
SUT VIC AB 2-0 CT1 TAPERPNT 27 (SUTURE) ×2 IMPLANT
SUTURE FIBERWR #2 38 T-5 BLUE (SUTURE) IMPLANT
TOWEL OR 17X26 10 PK STRL BLUE (TOWEL DISPOSABLE) ×6 IMPLANT
TRAY FOLEY MTR SLVR 16FR STAT (SET/KITS/TRAYS/PACK) IMPLANT
WATER STERILE IRR 1000ML POUR (IV SOLUTION) ×2 IMPLANT
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
## 2018-11-14 NOTE — Op Note (Signed)
Orthopaedic Surgery Operative Note (CSN: [id number] ) Date of Surgery: 11/14/2018  Admit Date: 11/13/2018   Diagnoses: Pre-Op Diagnoses: Right bicondylar tibial plateau Right tibial shaft fracture  Post-Op Diagnosis: Same  Procedures: 1. CPT 27536-Open reduction internal fixation of right bicondylar tibial plateau fracture 2. CPT 27758-Open reduction internal fixation of right tibial shaft fracture 3. CPT 27540-Open reduction internal fixation of right intercondylar spines  Surgeons : Primary: Moses Odoherty, Gillie MannersKevin P, MD  Assistant: Ulyses SouthwardSarah Yacobi, PA-C  Location: OR 9   Anesthesia:General  Antibiotics: Ancef 2g preop   Tourniquet time: Total Tourniquet Time Documented: Thigh (Right) - 91 minutes Total: Thigh (Right) - 91 minutes  Estimated Blood Loss:100 mL  Complications:None   Specimens:None   Implants: Implant Name Type Inv. Item Serial No. Manufacturer Lot No. LRB No. Used  SCREW CORTEX 3.5 60MM - [id number] Screw SCREW CORTEX 3.5 60MM  [employer]  Right 1  SCREW HEADED ST 3.5X80 - [id number] Screw SCREW HEADED ST 3.5X80  [employer]  Right 1  SCREW HEADED ST 3.5X85 - [id number] Screw SCREW HEADED ST 3.5X85  [employer]UMA  Right 1  SCREW HEADED ST 3.5X36 - [id number] Screw SCREW HEADED ST 3.5X36  [employer]  Right 3  SCREW SELF-TAP CORTEX 3.5X34 - [id number] Screw SCREW SELF-TAP CORTEX 3.5X34  [employer]  Right 1  SCREW LOCKING 3.5X80MM VA - [id number] Screw SCREW LOCKING 3.5X80MM VA  [employer]  Right 2  SCREW LOCKING VA 3.5X85MM - [id number] Screw SCREW LOCKING VA 3.5X85MM  [employer]  Right 1  PLATE PROX TIBIA LCP VA 3.5 - [id number] Plate PLATE PROX TIBIA LCP VA 3.5  SYN[employer]ES TRAUMA  Right 1     Indications for Surgery: 68 year old male with history of hepatitis C with history of polysubstance abuse including alcoholism and IV heroin abuse with a displaced bicondylar tibial plateau fracture. Due to the displacement of his injuries, I feel that open  reduction internal fixation is most appropriate.  I will perform a single incision approach with a lateral plate.  Risks and benefits were discussed with the patient as well as his wife.  The patient is unable to consent but the wife was able to consent over the phone.  Risks included but not limited to bleeding, infection, malunion, nonunion, hardware failure, posttraumatic arthritis, stiffness, wound healing problems, DVT even the possibility loss of limb or heart attack or stroke.  Patient is wife agrees and consent was signed.  Operative Findings: 1.  Comminuted bicondylar tibial plateau fracture with a significant intercondylar spine involvement treated with open reduction internal fixation using anterior lateral approach and separate percutaneous medial condyle fixation using a Synthes VA proximal tibial locking plate as noted above. 2.  Separate tibial shaft involvement and extension treated with the same VA proximal tibial locking plate. 3.  Lateral meniscus was without peripheral tear.  Procedure: The patient was identified in the preoperative holding area. Consent was confirmed with the patient and their family and all questions were answered. The operative extremity was marked after confirmation with the patient. he was then brought back to the operating room by our anesthesia colleagues.  He was placed under general anesthetic and carefully transferred over to a radiolucent flat top table.  A bump was placed under his operative hip.  A nonsterile tourniquet was placed to his upper thigh. The operative extremity was then prepped and draped in usual sterile fashion. A preoperative timeout was performed to verify the patient, the procedure, and the extremity. Preoperative antibiotics were dosed.  A standard anterior lateral approach was made to the proximal tibia.  I incised through the skin and carried it down to the IT band I entered the fascia just lateral to the patella and patellar tendon.  I  stayed extracapsular and continued distally releasing the IT band off of Gertie's tubercle.  I released it all the way back until I could palpate the fibular head.  A sub-meniscal arthrotomy was then performed it was tagged with Vicryl sutures for later repair through the plate.  There was no peripheral meniscus tear and the meniscocapsular junction was intact.  There was a separate posterior medial shear fragment that was a minimally displaced however there was some angulation and articular step-off.  I did not feel a separate posterior medial approach was needed however I did feel a reduction was warranted for the medial articular surface.  A percutaneous incision was made and a tine of a clamp was placed along the posterior medial metaphysis.  Another percutaneous incision was made along the anterior medial aspect of the tibia and a reduction maneuver was performed in the fracture was clamped obtaining near anatomic reduction of the medial articular surface using fluoroscopy.  Another percutaneous incision was made in a anterior posterior directed 3.5 mm lag screw was then used to hold this reduction provisionally.  I then returned to the lateral side.  The articular surface of the condyle was not significantly involved however there was some rotation and some inferior displacement posteriorly.  I used a footed tamp to elevate the posterior aspect of the joint through a split in the lateral cortex of the metaphysis and was able to elevate the joint back to a near anatomic position.  It was held provisionally with 1.6 mm K wires.  I then chose a VA proximal tibial locking plate.  There was involvement of the tibial shaft at the metadiaphyseal junction and I plan to plate the tibia more distal to incorporate that fracture and make sure it did not displace postoperatively.  The proximal portion of the plate was aligned in a 3.5 millimeter screw was used to bring the proximal portion of the plate flush to bone.   Percutaneous incision was made along the tibial shaft and a 3.5 mm nonlocking screw was used to bring the distal portion of the plate flush to bone.  A total of 4 nonlocking screws were placed into the tibial shaft.  The tibial shaft fracture was appropriately aligned on the AP and lateral views.  The metaphysis had slight amount of valgus but was nearly anatomic and 3 locking screws were placed in the proximal articular block.  All clamps and instruments were then removed and final fluoroscopic images were obtained.  Anatomic reduction of the joint as well as the metaphysis was obtained.  The incisions were copiously irrigated.  The tag sutures for the capsule were tied down to the plate.  A gram of vancomycin powder and 1.2 g of tobramycin powder were placed into the incisions.  The IT band fascia was closed with 0 Vicryl suture.  The skin was closed with 2-0 Vicryl and 3-0 nylon.  An incisional wound VAC was placed over the anterior lateral incision.  The remainder of the incisions were dressed with bacitracin ointment, Adaptic and 4 x 4's.  Sterile cast padding and a Ace wrap was placed and he was then placed in a knee immobilizer.  He was then awoken from anesthesia and taken to the PACU in stable condition.  Post Op  Plan/Instructions: The patient will be nonweightbearing to the right lower extremity.  He will receive postoperative Ancef.  He will receive Lovenox for DVT prophylaxis.  He will be placed in a hinged knee brace to allow for gentle knee range of motion.  Should be kept locked in extension at night to prevent a flexion contracture.  He will mobilize with physical therapy.  I was present and performed the entire surgery.  Ulyses Southward, PA-C did assist me throughout the case. An assistant was necessary given the difficulty in approach, maintenance of reduction and ability to instrument the fracture.   Truitt Merle, MD [facility]

## 2018-11-14 NOTE — Social Work (Signed)
CSW acknowledging consult for SNF placement. Will follow for therapy recommendations.   Emersyn Wyss, MSW, LCSWA [facility] [phone]   

## 2018-11-14 NOTE — Anesthesia Preprocedure Evaluation (Addendum)
Anesthesia Evaluation  Patient identified by MRN, date of birth, ID band Patient awake    Reviewed: Allergy & Precautions, H&P , NPO status , Patient's Chart, lab work & pertinent test results  Airway Mallampati: II  TM Distance: >3 FB Neck ROM: Full    Dental no notable dental hx. (+) Teeth Intact   Pulmonary neg pulmonary ROS, former smoker,    Pulmonary exam normal breath sounds clear to auscultation       Cardiovascular Exercise Tolerance: Good Normal cardiovascular exam Rhythm:Regular Rate:Normal     Neuro/Psych Anxiety    GI/Hepatic GERD  ,(+)     substance abuse  alcohol use and IV drug use, Hepatitis -, C  Endo/Other    Renal/GU      Musculoskeletal   Abdominal   Peds  Hematology   Anesthesia Other Findings   Reproductive/Obstetrics                            Lab Results  Component Value Date   CREATININE 0.68 11/14/2018   BUN 10 11/14/2018   NA 136 11/14/2018   K 3.9 11/14/2018   CL 101 11/14/2018   CO2 24 11/14/2018    Lab Results  Component Value Date   WBC 13.5 (H) 11/14/2018   HGB 13.1 11/14/2018   HCT 39.6 11/14/2018   MCV 83.9 11/14/2018   PLT 259 11/14/2018    Anesthesia Physical Anesthesia Plan  ASA: III  Anesthesia Plan: General   Post-op Pain Management:    Induction: Intravenous, Cricoid pressure planned and Rapid sequence  PONV Risk Score and Plan: 3 and Treatment may vary due to age or medical condition, Ondansetron and Dexamethasone  Airway Management Planned: Oral ETT  Additional Equipment:   Intra-op Plan:   Post-operative Plan: Extubation in OR  Informed Consent: I have reviewed the patients History and Physical, chart, labs and discussed the procedure including the risks, benefits and alternatives for the proposed anesthesia with the patient or authorized representative who has indicated his/her understanding and acceptance.      Dental advisory given  Plan Discussed with: CRNA  Anesthesia Plan Comments:         Anesthesia Quick Evaluation

## 2018-11-14 NOTE — Progress Notes (Signed)
Orthopedic Tech Progress Note Patient Details:  Bryan Powell 1951-01-17 [id number]  Patient ID: [name], male   DOB: 05-09-1951, 68 y.o.   MRN: [id number]   Saul Fordyce 11/14/2018, 4:06 PMCalled [employer] right hinged knee brace.

## 2018-11-14 NOTE — ED Provider Notes (Signed)
[facility] EMERGENCY DEPARTMENT Provider Note   CSN: [id number] Arrival date & time: 11/13/18  1847    History   Chief Complaint Chief Complaint  Patient presents with  . Fall    HPI Bryan Powell is a 68 y.o. male.     68 year old male with a history of lower extremity venous stasis, hepatitis C, esophageal reflux, heroin and alcohol abuse presents to the emergency department for evaluation of right knee pain and pain to the right chest wall after he fell approximately 10 feet onto gravel.  He states that he was on top of his house cleaning his gutters at the time of his fall.  He landed primarily on his right side.  He has not been ambulatory since the fall secondary to pain in his right leg/knee.  He has not received any medications for symptoms.  Denies loss of consciousness, headache, shortness of breath, neck or back pain, abdominal pain, bowel or bladder incontinence, extremity numbness or paresthesias, nausea or vomiting.  Has a history of recurrent cellulitis to his right leg due to venous stasis history.  He has not required antibiotics in a while and denies symptoms of cellulitis today.  He is not on chronic anticoagulation.  The history is provided by the patient. No language interpreter was used.  Fall     Past Medical History:  Diagnosis Date  . Alcohol abuse   . Anxiety   . Depression   . ED (erectile dysfunction)   . Genital herpes   . GERD (gastroesophageal reflux disease)   . Hep C w/o coma, chronic (HCC)   . Heroin abuse (HCC)   . Lower extremity venous stasis   . Poor venous access    hx. of Heroin, Alcohol abuse "extreme difficult vein access"  . Reflux     Patient Active Problem List   Diagnosis Date Noted  . Poor venous access 03/07/2018  . 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 Bilateral lower leg cellulitis   . Venous insufficiency of both lower extremities   . Bilateral lower extremity edema   . Varicose veins of lower  extremities with ulcer (HCC)   . Severe opioid use disorder (HCC) 10/05/2013  . Abscess of left hip 10/04/2012  . Finger infection 06/09/2012  . Recurrent cellulitis of lower leg 10/12/2011  . Lower extremity venous stasis   . Reflux   . ALLERGIC RHINITIS 11/02/2007  . ERECTILE DYSFUNCTION 11/02/2007  . GENITAL HERPES 05/23/2007  . Chronic hepatitis C virus infection (HCC) 05/23/2007  . ANXIETY 05/23/2007  . HEROIN ABUSE 05/23/2007  . GERD 05/23/2007  . LOW BACK PAIN 05/23/2007    Past Surgical History:  Procedure Laterality Date  . CHOLECYSTECTOMY    . I&D EXTREMITY  06/09/2012   Procedure: IRRIGATION AND DEBRIDEMENT EXTREMITY;  Surgeon: Sharma Covert, MD;  Location: [facility] OR;  Service: Orthopedics;  Laterality: Left;  . LAPAROTOMY N/A 08/01/2016   Procedure: EXPLORATORY LAPAROTOMY;  Surgeon: Berna Bue, MD;  Location: [facility] OR;  Service: General;  Laterality: N/A;  . ORBITAL FRACTURE SURGERY    . SPLENECTOMY, TOTAL N/A 08/01/2016   Procedure: SPLENECTOMY;  Surgeon: Violeta Gelinas, MD;  Location: [facility] OR;  Service: General;  Laterality: N/A;  . TONSILLECTOMY          Home Medications    Prior to Admission medications   Medication Sig Start Date End Date Taking? Authorizing Provider  gabapentin (NEURONTIN) 300 MG capsule Take 300 mg by mouth 3 (three) times daily.  Yes [provider]  LORazepam (ATIVAN) 0.5 MG tablet Take 0.5 mg by mouth 2 (two) times daily.   Yes [provider]  oxyCODONE (OXY IR/ROXICODONE) 5 MG immediate release tablet Take 5 mg by mouth 4 (four) times daily.   Yes [provider]  traZODone (DESYREL) 150 MG tablet Take 150 mg by mouth at bedtime. 07/24/17  Yes [provider]  esomeprazole (NEXIUM) 20 MG capsule Take 1 capsule (20 mg total) by mouth daily at 12 noon. For acid reflux Patient not taking: Reported on 08/02/2016 10/09/13   Armandina StammerNwoko, Agnes I, NP  loratadine (CLARITIN) 10 MG tablet Take 1 tablet (10 mg total) by  mouth daily as needed for allergies. Patient not taking: Reported on 11/14/2018 01/03/14   Blake DivineWofford, John, MD  Multiple Vitamin (MULTIVITAMIN WITH MINERALS) TABS tablet Take 1 tablet by mouth daily. For low vitamin Patient not taking: Reported on 11/14/2018 10/09/13   Armandina StammerNwoko, Agnes I, NP  Oxycodone HCl 10 MG TABS Take 1-2 tablets (10-20 mg total) by mouth every 6 (six) hours as needed (5mg  for mild pain, 10mg  for moderate pain, 15mg  for severe pain). Patient not taking: Reported on 11/14/2018 08/09/16   Adam PhenixSimaan, Elizabeth S, PA-C  potassium chloride SA (K-DUR,KLOR-CON) 20 MEQ tablet Take 1 tablet (20 mEq total) by mouth 2 (two) times daily. Patient not taking: Reported on 11/14/2018 08/09/16   Adam PhenixSimaan, Elizabeth S, PA-C  traMADol (ULTRAM) 50 MG tablet Take 1 tablet (50 mg total) by mouth every 6 (six) hours as needed. Patient not taking: Reported on 11/14/2018 11/13/16   Raeford RazorKohut, Stephen, MD    Family History Family History  Problem Relation Age of Onset  . Stroke Mother   . Heart disease Father     Social History Social History   Tobacco Use  . Smoking status: Former Smoker    Types: Cigarettes    Last attempt to quit: 04/15/2012    Years since quitting: 6.5  . Smokeless tobacco: Never Used  Substance Use Topics  . Alcohol use: Yes    Alcohol/week: 0.0 standard drinks    Comment: in rehab  . Drug use: No    Types: Heroin    Comment:  reports heroine use today 08/01/16     Allergies   Patient has no known allergies.   Review of Systems Review of Systems Ten systems reviewed and are negative for acute change, except as noted in the HPI.    Physical Exam Updated Vital Signs BP (!) 156/81   Pulse 92   Temp 98.2 F (36.8 C) (Oral)   Resp 20   SpO2 96%   Physical Exam Vitals signs and nursing note reviewed.  Constitutional:      General: He is not in acute distress.    Appearance: He is well-developed. He is not diaphoretic.  HENT:     Head: Normocephalic.     Comments: Faint  abrasion to right upper forehead Eyes:     General: No scleral icterus.    Conjunctiva/sclera: Conjunctivae normal.  Neck:     Musculoskeletal: Normal range of motion.  Cardiovascular:     Rate and Rhythm: Normal rate and regular rhythm.     Pulses: Normal pulses.     Comments: DP pulse 1+ in the RLE. Capillary refill intact in the RLE. Pulmonary:     Effort: Pulmonary effort is normal. No respiratory distress.     Breath sounds: No stridor.     Comments: TTP the right lateral chest wall.  Chest expansion symmetric. Respirations unlabored. Chest:     Chest wall: Tenderness present.  Abdominal:     Comments: Soft, nontender abdomen  Musculoskeletal: Normal range of motion.     Right lower leg: Edema (pitting) present.     Left lower leg: Edema (pitting) present.     Comments: RLE is compressible with 2+ pitting edema. TTP inferior to the right patella. No crepitus. No tenderness to palpation to the cervical, thoracic, lumbosacral midline.  No bony deformities, step-offs, crepitus to vertebrae of the back.  Skin:    General: Skin is warm and dry.     Coloration: Skin is not pale.     Findings: No erythema or rash.     Comments: RLE and foot warm to touch.  Neurological:     Mental Status: He is alert and oriented to person, place, and time.     Coordination: Coordination normal.     Comments: Sensation to light touch intact and equal in bilateral lower extremities.  Patient able to wiggle all toes of the right foot.  Psychiatric:        Behavior: Behavior normal.      ED Treatments / Results  Labs (all labs ordered are listed, but only abnormal results are displayed) Labs Reviewed  CBC WITH DIFFERENTIAL/PLATELET - Abnormal; Notable for the following components:      Result Value   WBC 13.5 (*)    Neutro Abs 8.9 (*)    Monocytes Absolute 1.3 (*)    All other components within normal limits  BASIC METABOLIC PANEL - Abnormal; Notable for the following components:   Glucose,  Bld 111 (*)    All other components within normal limits  SARS CORONAVIRUS 2 (HOSPITAL ORDER, PERFORMED IN [facility])  ETHANOL  RAPID URINE DRUG SCREEN, HOSP PERFORMED    EKG None  Radiology Dg Ribs Unilateral W/chest Right  Result Date: 11/14/2018 CLINICAL DATA:  Pain status post fall EXAM: RIGHT RIBS AND CHEST - 3+ VIEW COMPARISON:  11/13/2016. FINDINGS: There is a stable old sclerotic lesion in the proximal left humerus. There are old healed bilateral rib fractures. There is an acute fracture involving the fifth and sixth ribs anterior laterally on the right. No evidence of a right-sided pneumothorax. The cardiac silhouette is mildly enlarged. There is a subacute fracture involving the seventh rib anterior laterally on the right. IMPRESSION: 1. Acute mildly displaced fractures involving the fifth and sixth ribs anterior laterally on the right. 2. Subacute appearing fracture involving the seventh rib on the right. 3. No pneumothorax. 4. Multiple old healed bilateral rib fractures are again noted. Electronically Signed   By: Katherine Mantle M.D.   On: 11/14/2018 01:53   Dg Tibia/fibula Right  Result Date: 11/13/2018 CLINICAL DATA:  Right lower leg pain after fall from ladder. EXAM: RIGHT TIBIA AND FIBULA - 2 VIEW COMPARISON:  None. FINDINGS: Severely comminuted and displaced fracture is seen involving the lateral tibial plateau and proximal tibial shaft with intra-articular extension. Severely comminuted and displaced fracture is seen involving the proximal right fibula as well. IMPRESSION: Severely comminuted and displaced fractures are seen involving the right lateral tibial plateau and proximal tibial shaft, as well as the proximal right fibula. Electronically Signed   By: Lupita Raider M.D.   On: 11/13/2018 19:35   Ct Knee Right Wo Contrast  Result Date: 11/14/2018 CLINICAL DATA:  Fracture. EXAM: CT OF THE right KNEE WITHOUT CONTRAST TECHNIQUE: Multidetector CT imaging of  the right  knee was performed according to the standard protocol. Multiplanar CT image reconstructions were also generated. COMPARISON:  X-ray from 11/13/2018 FINDINGS: Bones/Joint/Cartilage Again identified is a highly comminuted impacted fracture involving the proximal tibia. The fracture planes extend through the lateral and medial tibial plateaus. There is a highly comminuted fracture involving the proximal fibula. There is a large joint effusion with evidence of lipohemarthrosis. Ligaments Suboptimally assessed by CT. Muscles and Tendons There is extensive surrounding soft tissue swelling. A large joint effusion with lipohemarthrosis is noted. Soft tissues Surrounding soft tissue swelling is noted. IMPRESSION: Again noted is a highly comminuted impacted fracture of proximal tibia with fracture planes extending through the articular surfaces of the lateral and medial tibial plateaus. Highly comminuted fracture of the proximal fibula. Large joint effusion with lipohemarthrosis. Electronically Signed   By: Katherine Mantle M.D.   On: 11/14/2018 01:51   Dg Knee Complete 4 Views Right  Result Date: 11/13/2018 CLINICAL DATA:  Right leg pain after fall from ladder. EXAM: RIGHT KNEE - COMPLETE 4+ VIEW COMPARISON:  None. FINDINGS: Severely comminuted and displaced fracture is seen involving the lateral tibial plateau and proximal tibial shaft. Also noted is severely comminuted and displaced fracture involving the proximal fibula. Visualized portion of the femur and patella are unremarkable. IMPRESSION: Severely comminuted and displaced fractures are seen involving the lateral tibial plateau and proximal tibial shaft with intra-articular extension, as well as the proximal fibula. Electronically Signed   By: Lupita Raider M.D.   On: 11/13/2018 19:33    Procedures Procedures (including critical care time)  Medications Ordered in ED Medications  LORazepam (ATIVAN) injection 0-4 mg (has no administration in  time range)    Or  LORazepam (ATIVAN) tablet 0-4 mg (has no administration in time range)  LORazepam (ATIVAN) injection 0-4 mg (has no administration in time range)    Or  LORazepam (ATIVAN) tablet 0-4 mg (has no administration in time range)  thiamine (VITAMIN B-1) tablet 100 mg (has no administration in time range)    Or  thiamine (B-1) injection 100 mg (has no administration in time range)  HYDROmorphone (DILAUDID) injection 1 mg (has no administration in time range)  ketamine (KETALAR) injection 25 mg (has no administration in time range)  ondansetron (ZOFRAN) injection 4 mg (has no administration in time range)  HYDROmorphone (DILAUDID) injection 0.5 mg (0.5 mg Intramuscular Given 11/14/18 0052)  HYDROmorphone (DILAUDID) injection 1 mg (1 mg Intramuscular Given 11/14/18 0206)     Initial Impression / Assessment and Plan / ED Course  I have reviewed the triage vital signs and the nursing notes.  Pertinent labs & imaging results that were available during my care of the patient were reviewed by me and considered in my medical decision making (see chart for details).        23:27 AM 68 year old male presents to the emergency department for evaluation of right lower extremity pain after a fall from the roof on his right side. No LOC, headache, nausea, vomiting. Patient not amnestic to event. Denies bowel/bladder incontinence, not on blood thinners. Pain to his right chest wall as well as R knee. Xray of knee in triage positive for tibial plateau fracture. Will obtain CT scan. Orders also placed for right rib series.   Hx of drug and ETOH abuse, but clinically sober. Equal grip strength in BUE; preserved sensation. No TTP to the cervical midline. Patient exhibiting full ROM of neck. No hematoma or contusion to scalp.  1:45 AM Patient discussed with  Dr. Everardo Pacific of orthopedics who has reviewed the patient's imaging.  He states the patient will require admission for operative stabilization of  his fracture.  Request consultation to hospitalist for admission given past medical history.  Will discuss case with [facility] after labs result.  2:15 AM Xrays with right 5th and 6th rib fractures. No PTX. Patient informed on need for admission.  He expresses concern about admission given that he drinks approximately a pint of alcohol daily; last drink at 1700.  He also uses daily heroin and oxycodone.  He is afraid of withdrawals.  He has no history of seizures from alcohol withdrawal, but does get tremulous.  He has been placed on CIWA protocol and continues to get opioid medications for pain control.  3:49 AM Patient has received 2mg  Ativan for initial CIWA of 12. Ethanol is <10. UDS and COVID screen pending.   Case discussed with Dr[facility] Toniann Fail of [facility] who will admit.  Vitals:   11/13/18 2110 11/14/18 0100 11/14/18 0237 11/14/18 0245  BP: (!) 150/84 (!) 158/83 (!) 153/85 (!) 156/81  Pulse: 84 93 93 92  Resp: 18 18  20   Temp: 98.2 F (36.8 C)     TempSrc: Oral     SpO2: 100% 92%  96%    Final Clinical Impressions(s) / ED Diagnoses   Final diagnoses:  Closed fracture of right tibial plateau, initial encounter  Closed fracture of proximal end of right fibula, unspecified fracture morphology, initial encounter  Closed fracture of multiple ribs of right side, initial encounter    ED Discharge Orders    None       Antony Madura, PA-C 11/14/18 0356    Nira Conn, MD 11/14/18 [phone]

## 2018-11-14 NOTE — ED Notes (Signed)
Attempted IV, unsuccessful.  Patient states that he usually has to have a picc line.

## 2018-11-14 NOTE — Anesthesia Postprocedure Evaluation (Signed)
Anesthesia Post Note  Patient: Bryan Powell  Procedure(s) Performed: OPEN REDUCTION INTERNAL FIXATION (ORIF) TIBIAL PLATEAU (Right )     Patient location during evaluation: PACU Anesthesia Type: General Level of consciousness: awake and alert Pain management: pain level controlled Vital Signs Assessment: post-procedure vital signs reviewed and stable Respiratory status: spontaneous breathing, nonlabored ventilation, respiratory function stable and patient connected to nasal cannula oxygen Cardiovascular status: blood pressure returned to baseline and stable Postop Assessment: no apparent nausea or vomiting Anesthetic complications: no    Last Vitals:  Vitals:   11/14/18 1454 11/14/18 1500  BP: (!) 153/78   Pulse: 74 67  Resp: (!) 21 12  Temp: 36.8 C   SpO2: 98% 96%    Last Pain:  Vitals:   11/14/18 1454  TempSrc: Oral  PainSc:                  Trevor Iha

## 2018-11-14 NOTE — Progress Notes (Signed)
Received patient from [facility]. Oriented to self and place.vss. confused at times.Kept saying he wants to go home.Patient re-oriented.

## 2018-11-14 NOTE — ED Notes (Signed)
Patient returned from radiology

## 2018-11-14 NOTE — H&P (Signed)
History and Physical    Bryan Powell RFF:[id number] DOB: 1951-04-09 DOA: 11/13/2018  PCP: Thayer Headings, MD (Inactive)  Patient coming from: Home.  Chief Complaint: Fall.  HPI: Bryan Powell is a 68 y.o. male with history of hepatitis C previously treated, polysubstance abuse including IV heroin alcohol and tobacco had a fall yesterday while cleaning his gutters at home.  Patient states he did hit his head but did not lose consciousness.  Has been hurting his right leg and right chest area and was brought to the ER.  ED Course: In the ER x-rays of the right side showed rib fracture involving the and also subacute fracture of the right 7.  CT of the right knee shows comminuted fracture of the proximal tibia and fibula.  On-call orthopedic surgeon Dr. Everardo Pacific has advised hospitalist admission because of history of polysubstance abuse and they will be seeing patient in consult and likely surgery.  On my exam patient still complains of right-sided chest and abdominal pain for which I have ordered CT chest and abdomen.  Review of Systems: As per HPI, rest all negative.   Past Medical History:  Diagnosis Date   Alcohol abuse    Anxiety    Depression    ED (erectile dysfunction)    Genital herpes    GERD (gastroesophageal reflux disease)    Hep C w/o coma, chronic (HCC)    Heroin abuse (HCC)    Lower extremity venous stasis    Poor venous access    hx. of Heroin, Alcohol abuse "extreme difficult vein access"   Reflux     Past Surgical History:  Procedure Laterality Date   CHOLECYSTECTOMY     I&D EXTREMITY  06/09/2012   Procedure: IRRIGATION AND DEBRIDEMENT EXTREMITY;  Surgeon: Sharma Covert, MD;  Location: [facility] OR;  Service: Orthopedics;  Laterality: Left;   LAPAROTOMY N/A 08/01/2016   Procedure: EXPLORATORY LAPAROTOMY;  Surgeon: Berna Bue, MD;  Location: [facility] OR;  Service: General;  Laterality: N/A;   ORBITAL FRACTURE SURGERY     SPLENECTOMY, TOTAL N/A  08/01/2016   Procedure: SPLENECTOMY;  Surgeon: Violeta Gelinas, MD;  Location: [facility] OR;  Service: General;  Laterality: N/A;   TONSILLECTOMY       reports that he quit smoking about 6 years ago. His smoking use included cigarettes. He has never used smokeless tobacco. He reports current alcohol use. He reports that he does not use drugs.  No Known Allergies  Family History  Problem Relation Age of Onset   Stroke Mother    Heart disease Father     Prior to Admission medications   Medication Sig Start Date End Date Taking? Authorizing Provider  gabapentin (NEURONTIN) 300 MG capsule Take 300 mg by mouth 3 (three) times daily.   Yes [provider]  LORazepam (ATIVAN) 0.5 MG tablet Take 0.5 mg by mouth 2 (two) times daily.   Yes [provider]  oxyCODONE (OXY IR/ROXICODONE) 5 MG immediate release tablet Take 5 mg by mouth 4 (four) times daily.   Yes [provider]  traZODone (DESYREL) 150 MG tablet Take 150 mg by mouth at bedtime. 07/24/17  Yes [provider]  esomeprazole (NEXIUM) 20 MG capsule Take 1 capsule (20 mg total) by mouth daily at 12 noon. For acid reflux Patient not taking: Reported on 08/02/2016 10/09/13   Armandina Stammer I, NP  loratadine (CLARITIN) 10 MG tablet Take 1 tablet (10 mg total) by mouth daily as needed for allergies. Patient not  taking: Reported on 11/14/2018 01/03/14   Blake DivineWofford, John, MD  Multiple Vitamin (MULTIVITAMIN WITH MINERALS) TABS tablet Take 1 tablet by mouth daily. For low vitamin Patient not taking: Reported on 11/14/2018 10/09/13   Armandina StammerNwoko, Agnes I, NP  Oxycodone HCl 10 MG TABS Take 1-2 tablets (10-20 mg total) by mouth every 6 (six) hours as needed (5mg  for mild pain, 10mg  for moderate pain, 15mg  for severe pain). Patient not taking: Reported on 11/14/2018 08/09/16   Adam PhenixSimaan, Elizabeth S, PA-C  potassium chloride SA (K-DUR,KLOR-CON) 20 MEQ tablet Take 1 tablet (20 mEq total) by mouth 2 (two) times daily. Patient not taking: Reported  on 11/14/2018 08/09/16   Adam PhenixSimaan, Elizabeth S, PA-C  traMADol (ULTRAM) 50 MG tablet Take 1 tablet (50 mg total) by mouth every 6 (six) hours as needed. Patient not taking: Reported on 11/14/2018 11/13/16   Raeford RazorKohut, Stephen, MD    Physical Exam: Vitals:   11/14/18 0100 11/14/18 0237 11/14/18 0245 11/14/18 0400  BP: (!) 158/83 (!) 153/85 (!) 156/81 (!) 167/86  Pulse: 93 93 92 (!) 103  Resp: 18  20 17   Temp:      TempSrc:      SpO2: 92%  96% 96%      Constitutional: Moderately built and nourished. Vitals:   11/14/18 0100 11/14/18 0237 11/14/18 0245 11/14/18 0400  BP: (!) 158/83 (!) 153/85 (!) 156/81 (!) 167/86  Pulse: 93 93 92 (!) 103  Resp: 18  20 17   Temp:      TempSrc:      SpO2: 92%  96% 96%   Eyes: Anicteric no pallor. ENMT: No discharge from the ears eyes nose and mouth. Neck: No mass felt.  No neck rigidity. Respiratory: No rhonchi or crepitations. Cardiovascular: S1-S2 heard. Abdomen: Mild pain in the right upper quadrant. Musculoskeletal: Right lower extremity swelling. Skin: Ecchymotic areas. Neurologic: Alert awake oriented to time place and person.  Moves all extremities. Psychiatric: Appears normal.  Normal affect.   Labs on Admission: I have personally reviewed following labs and imaging studies  CBC: Recent Labs  Lab 11/14/18 0217  WBC 13.5*  NEUTROABS 8.9*  HGB 13.1  HCT 39.6  MCV 83.9  PLT 259   Basic Metabolic Panel: Recent Labs  Lab 11/14/18 0217  NA 136  K 3.9  CL 101  CO2 24  GLUCOSE 111*  BUN 10  CREATININE 0.68  CALCIUM 9.1   GFR: CrCl cannot be calculated (Unknown ideal weight.). Liver Function Tests: No results for input(s): AST, ALT, ALKPHOS, BILITOT, PROT, ALBUMIN in the last 168 hours. No results for input(s): LIPASE, AMYLASE in the last 168 hours. No results for input(s): AMMONIA in the last 168 hours. Coagulation Profile: No results for input(s): INR, PROTIME in the last 168 hours. Cardiac Enzymes: No results for input(s):  CKTOTAL, CKMB, CKMBINDEX, TROPONINI in the last 168 hours. BNP (last 3 results) No results for input(s): PROBNP in the last 8760 hours. HbA1C: No results for input(s): HGBA1C in the last 72 hours. CBG: No results for input(s): GLUCAP in the last 168 hours. Lipid Profile: No results for input(s): CHOL, HDL, LDLCALC, TRIG, CHOLHDL, LDLDIRECT in the last 72 hours. Thyroid Function Tests: No results for input(s): TSH, T4TOTAL, FREET4, T3FREE, THYROIDAB in the last 72 hours. Anemia Panel: No results for input(s): VITAMINB12, FOLATE, FERRITIN, TIBC, IRON, RETICCTPCT in the last 72 hours. Urine analysis:    Component Value Date/Time   COLORURINE YELLOW 08/08/2016 1250   APPEARANCEUR CLEAR 08/08/2016 1250   LABSPEC  1.011 08/08/2016 1250   PHURINE 7.0 08/08/2016 1250   GLUCOSEU 50 (A) 08/08/2016 1250   HGBUR NEGATIVE 08/08/2016 1250   BILIRUBINUR NEGATIVE 08/08/2016 1250   KETONESUR NEGATIVE 08/08/2016 1250   PROTEINUR NEGATIVE 08/08/2016 1250   NITRITE NEGATIVE 08/08/2016 1250   LEUKOCYTESUR NEGATIVE 08/08/2016 1250   Sepsis Labs: (procalcitonin:4,lacticidven:4) )No results found for this or any previous visit (from the past 240 hour(s)).   Radiological Exams on Admission: Dg Ribs Unilateral W/chest Right  Result Date: 11/14/2018 CLINICAL DATA:  Pain status post fall EXAM: RIGHT RIBS AND CHEST - 3+ VIEW COMPARISON:  11/13/2016. FINDINGS: There is a stable old sclerotic lesion in the proximal left humerus. There are old healed bilateral rib fractures. There is an acute fracture involving the fifth and sixth ribs anterior laterally on the right. No evidence of a right-sided pneumothorax. The cardiac silhouette is mildly enlarged. There is a subacute fracture involving the seventh rib anterior laterally on the right. IMPRESSION: 1. Acute mildly displaced fractures involving the fifth and sixth ribs anterior laterally on the right. 2. Subacute appearing fracture involving the seventh  rib on the right. 3. No pneumothorax. 4. Multiple old healed bilateral rib fractures are again noted. Electronically Signed   By: Katherine Mantle M.D.   On: 11/14/2018 01:53   Dg Tibia/fibula Right  Result Date: 11/13/2018 CLINICAL DATA:  Right lower leg pain after fall from ladder. EXAM: RIGHT TIBIA AND FIBULA - 2 VIEW COMPARISON:  None. FINDINGS: Severely comminuted and displaced fracture is seen involving the lateral tibial plateau and proximal tibial shaft with intra-articular extension. Severely comminuted and displaced fracture is seen involving the proximal right fibula as well. IMPRESSION: Severely comminuted and displaced fractures are seen involving the right lateral tibial plateau and proximal tibial shaft, as well as the proximal right fibula. Electronically Signed   By: Lupita Raider M.D.   On: 11/13/2018 19:35   Ct Knee Right Wo Contrast  Result Date: 11/14/2018 CLINICAL DATA:  Fracture. EXAM: CT OF THE right KNEE WITHOUT CONTRAST TECHNIQUE: Multidetector CT imaging of the right knee was performed according to the standard protocol. Multiplanar CT image reconstructions were also generated. COMPARISON:  X-ray from 11/13/2018 FINDINGS: Bones/Joint/Cartilage Again identified is a highly comminuted impacted fracture involving the proximal tibia. The fracture planes extend through the lateral and medial tibial plateaus. There is a highly comminuted fracture involving the proximal fibula. There is a large joint effusion with evidence of lipohemarthrosis. Ligaments Suboptimally assessed by CT. Muscles and Tendons There is extensive surrounding soft tissue swelling. A large joint effusion with lipohemarthrosis is noted. Soft tissues Surrounding soft tissue swelling is noted. IMPRESSION: Again noted is a highly comminuted impacted fracture of proximal tibia with fracture planes extending through the articular surfaces of the lateral and medial tibial plateaus. Highly comminuted fracture of the  proximal fibula. Large joint effusion with lipohemarthrosis. Electronically Signed   By: Katherine Mantle M.D.   On: 11/14/2018 01:51   Dg Knee Complete 4 Views Right  Result Date: 11/13/2018 CLINICAL DATA:  Right leg pain after fall from ladder. EXAM: RIGHT KNEE - COMPLETE 4+ VIEW COMPARISON:  None. FINDINGS: Severely comminuted and displaced fracture is seen involving the lateral tibial plateau and proximal tibial shaft. Also noted is severely comminuted and displaced fracture involving the proximal fibula. Visualized portion of the femur and patella are unremarkable. IMPRESSION: Severely comminuted and displaced fractures are seen involving the lateral tibial plateau and proximal tibial shaft with intra-articular extension, as well as  the proximal fibula. Electronically Signed   By: Lupita Raider M.D.   On: 11/13/2018 19:33     Assessment/Plan Active Problems:   Tibial fracture   Polysubstance abuse (HCC)    1. Highly comminuted fracture of the proximal tibia and fibula on the right side status post fall with right-sided rib fractures involving the fifth sixth and seventh -orthopedic surgery has been consulted.  Likely going for surgery.  Given the persistent pain in the right chest and right upper quadrant CT chest abdomen has been ordered along with CT head. 2. Polysubstance abuse including alcohol IV heroin tobacco abuse.  Advised about quitting will get social work consult.  Patient has been placed on CIWA protocol. 3. Patient hepatitis C has been treated previously per patient.  Patient LFTs repeat metabolic panel EKG INR pending.   DVT prophylaxis: SCDs. Code Status: Full code. Family Communication: Discussed with patient. Disposition Plan: Home. Consults called: Orthopedics. Admission status: Inpatient.   Eduard Clos MD [facility] Pager [phone].  If 7PM-7AM, please contact night-coverage [email or web address] Password [facility]  11/14/2018, 4:23 AM

## 2018-11-14 NOTE — Progress Notes (Signed)
Orthopedic Tech Progress Note Patient Details:  Bryan Powell [birth year] [id number]  Ortho Devices Type of Ortho Device: Crutches       Saul Fordyce 11/14/2018, 5:23 PM

## 2018-11-14 NOTE — ED Notes (Signed)
To CT and xray

## 2018-11-14 NOTE — Transfer of Care (Signed)
Immediate Anesthesia Transfer of Care Note  Patient: Bryan Powell  Procedure(s) Performed: OPEN REDUCTION INTERNAL FIXATION (ORIF) TIBIAL PLATEAU (Right )  Patient Location: PACU  Anesthesia Type:General  Level of Consciousness: drowsy  Airway & Oxygen Therapy: Patient Spontanous Breathing and Patient connected to face mask oxygen  Post-op Assessment: Report given to RN and Post -op Vital signs reviewed and stable  Post vital signs: Reviewed and stable  Last Vitals:  Vitals Value Taken Time  BP 225/193 11/14/2018 11:27 AM  Temp    Pulse 104 11/14/2018 11:29 AM  Resp 27 11/14/2018 11:29 AM  SpO2 94 % 11/14/2018 11:29 AM  Vitals shown include unvalidated device data.  Last Pain:  Vitals:   11/14/18 0650  TempSrc:   PainSc: 10-Worst pain ever         Complications: No apparent anesthesia complications

## 2018-11-14 NOTE — Consult Note (Signed)
Orthopaedic Trauma Service (OTS) Consult   [name] MRN: [id number] DOB/AGE: [birth year] 68 y.o.  Reason for Consult:Right tibial plateau fracture Referring Physician: Dr. Ramond Marrow, MD [name]  HPI: Bryan Powell is an 68 y.o. male who is being seen in consultation at the request of Dr. Everardo Pacific for evaluation of right tibial plateau fracture.  The patient has a history of hepatitis C with polysubstance abuse including IV heroin and alcohol and tobacco use.  He was cleaning his gutters yesterday when he fell off a ladder.  He did not lose consciousness complained of severe right knee pain.  Cannot ambulate.  Brought to the emergency room where x-rays show a a bicondylar tibial plateau fracture.  CT scan was obtained.  Due to the complexity of his injury Dr. Genia Hotter felt that it was outside the scope of practice and required open reduction internal fixation by an orthopedic traumatologist.  Patient seen and evaluated in the preoperative holding area.  Patient has some confusion but he is able to awaken and answer questions appropriately.  States that he drinks about a pint of alcohol a day.  Patient ambulates without assist device at baseline.  He has lower extremity venous stasis.  Patient is unable to state whether his legs had any significant issues over the last weeks.  Patient complains of severe pain in his leg denies any pain anywhere else.  Notes mild chest pain.  Denies any upper extremity or left lower extremity pain.  Past Medical History:  Diagnosis Date  . Alcohol abuse   . Anxiety   . Depression   . ED (erectile dysfunction)   . Genital herpes   . GERD (gastroesophageal reflux disease)   . Hep C w/o coma, chronic (HCC)   . Heroin abuse (HCC)   . Lower extremity venous stasis   . Poor venous access    hx. of Heroin, Alcohol abuse "extreme difficult vein access"  . Reflux     Past Surgical History:  Procedure Laterality Date  . CHOLECYSTECTOMY     . I&D EXTREMITY  06/09/2012   Procedure: IRRIGATION AND DEBRIDEMENT EXTREMITY;  Surgeon: Sharma Covert, MD;  Location: [facility] OR;  Service: Orthopedics;  Laterality: Left;  . LAPAROTOMY N/A 08/01/2016   Procedure: EXPLORATORY LAPAROTOMY;  Surgeon: Berna Bue, MD;  Location: [facility] OR;  Service: General;  Laterality: N/A;  . ORBITAL FRACTURE SURGERY    . SPLENECTOMY, TOTAL N/A 08/01/2016   Procedure: SPLENECTOMY;  Surgeon: Violeta Gelinas, MD;  Location: [facility] OR;  Service: General;  Laterality: N/A;  . TONSILLECTOMY      Family History  Problem Relation Age of Onset  . Stroke Mother   . Heart disease Father     Social History:  reports that he quit smoking about 6 years ago. His smoking use included cigarettes. He has never used smokeless tobacco. He reports current alcohol use. He reports that he does not use drugs.  Allergies: No Known Allergies  Medications:  No current facility-administered medications on file prior to encounter.    Current Outpatient Medications on File Prior to Encounter  Medication Sig Dispense Refill  . gabapentin (NEURONTIN) 300 MG capsule Take 300 mg by mouth 3 (three) times daily.    Marland Kitchen LORazepam (ATIVAN) 0.5 MG tablet Take 0.5 mg by mouth 2 (two) times daily.    Marland Kitchen oxyCODONE (OXY IR/ROXICODONE) 5 MG immediate release tablet Take 5 mg by mouth 4 (four) times daily.    . traZODone (DESYREL)  150 MG tablet Take 150 mg by mouth at bedtime.    Marland Kitchen. esomeprazole (NEXIUM) 20 MG capsule Take 1 capsule (20 mg total) by mouth daily at 12 noon. For acid reflux (Patient not taking: Reported on 08/02/2016)    . loratadine (CLARITIN) 10 MG tablet Take 1 tablet (10 mg total) by mouth daily as needed for allergies. (Patient not taking: Reported on 11/14/2018) 14 tablet 0  . Multiple Vitamin (MULTIVITAMIN WITH MINERALS) TABS tablet Take 1 tablet by mouth daily. For low vitamin (Patient not taking: Reported on 11/14/2018)    . Oxycodone HCl 10 MG TABS Take 1-2 tablets (10-20 mg total) by  mouth every 6 (six) hours as needed (5mg  for mild pain, 10mg  for moderate pain, 15mg  for severe pain). (Patient not taking: Reported on 11/14/2018) 15 tablet 0  . potassium chloride SA (K-DUR,KLOR-CON) 20 MEQ tablet Take 1 tablet (20 mEq total) by mouth 2 (two) times daily. (Patient not taking: Reported on 11/14/2018) 60 tablet 1  . traMADol (ULTRAM) 50 MG tablet Take 1 tablet (50 mg total) by mouth every 6 (six) hours as needed. (Patient not taking: Reported on 11/14/2018) 10 tablet 0    ROS: Unable to obtain secondary to patient's confusion.  Exam: Blood pressure (!) 150/91, pulse 95, temperature 98.2 F (36.8 C), temperature source Oral, resp. rate 10, SpO2 96 %. General: No acute distress in mild discomfort Orientation: Patient in and out of sleep.  Arouses and answers questions appropriately. Mood and Affect: Cooperative when awake. Gait: Unable to assess. Coordination and balance: Within normal limits  Right lower extremity: Swelling is appropriate about his knee.  Skin wrinkles mildly.  Large effusion on exam unable to bend his knee at all.  Significant tenderness to palpation proximally.  About mid calf down he has a significant venous stasis with no active ulcers.  No active cellulitis or erythema.  His foot is significantly swollen but warm and well-perfused.  He endorses sensation of dorsum and plantar aspect of his foot.  He is able to wiggle his toes but does not follow any further neuro exam.  Left lower extremity skin without lesions. No tenderness to palpation. Full painless ROM, full strength in each muscle groups without evidence of instability.   Medical Decision Making: Imaging: X-rays and CT scan of the right knee show comminuted bicondylar tibial plateau fracture with significant articular involvement.  Significant metaphyseal involvement as well.  Labs:  Results for orders placed or performed during the hospital encounter of 11/13/18 (from the past 24 hour(s))  CBC with  Differential     Status: Abnormal   Collection Time: 11/14/18  2:17 AM  Result Value Ref Range   WBC 13.5 (H) 4.0 - 10.5 K/uL   RBC 4.72 4.22 - 5.81 MIL/uL   Hemoglobin 13.1 13.0 - 17.0 g/dL   HCT 16.139.6 09.639.0 - 04.552.0 %   MCV 83.9 80.0 - 100.0 fL   MCH 27.8 26.0 - 34.0 pg   MCHC 33.1 30.0 - 36.0 g/dL   RDW 40.914.5 81.111.5 - 91.415.5 %   Platelets 259 150 - 400 K/uL   nRBC 0.0 0.0 - 0.2 %   Neutrophils Relative % 67 %   Neutro Abs 8.9 (H) 1.7 - 7.7 K/uL   Lymphocytes Relative 23 %   Lymphs Abs 3.2 0.7 - 4.0 K/uL   Monocytes Relative 10 %   Monocytes Absolute 1.3 (H) 0.1 - 1.0 K/uL   Eosinophils Relative 0 %   Eosinophils Absolute 0.0 0.0 -  0.5 K/uL   Basophils Relative 0 %   Basophils Absolute 0.0 0.0 - 0.1 K/uL   Immature Granulocytes 0 %   Abs Immature Granulocytes 0.04 0.00 - 0.07 K/uL  Basic metabolic panel     Status: Abnormal   Collection Time: 11/14/18  2:17 AM  Result Value Ref Range   Sodium 136 135 - 145 mmol/L   Potassium 3.9 3.5 - 5.1 mmol/L   Chloride 101 98 - 111 mmol/L   CO2 24 22 - 32 mmol/L   Glucose, Bld 111 (H) 70 - 99 mg/dL   BUN 10 8 - 23 mg/dL   Creatinine, Ser 7.56 0.61 - 1.24 mg/dL   Calcium 9.1 8.9 - 43.3 mg/dL   GFR calc non Af Amer >60 >60 mL/min   GFR calc Af Amer >60 >60 mL/min   Anion gap 11 5 - 15  Ethanol     Status: None   Collection Time: 11/14/18  2:17 AM  Result Value Ref Range   Alcohol, Ethyl (B) <10 <10 mg/dL  SARS Coronavirus 2 (CEPHEID - Performed in [facility] hospital lab), Hosp Order     Status: None   Collection Time: 11/14/18  2:21 AM  Result Value Ref Range   SARS Coronavirus 2 NEGATIVE NEGATIVE  Type and screen  [facility]     Status: None   Collection Time: 11/14/18  5:00 AM  Result Value Ref Range   ABO/RH(D) O POS    Antibody Screen NEG    Sample Expiration      11/17/2018,2359 Performed at [facility], [address] [place], [place] [address]   Protime-INR     Status: None   Collection Time:  11/14/18  5:02 AM  Result Value Ref Range   Prothrombin Time 14.6 11.4 - 15.2 seconds   INR 1.2 0.8 - 1.2  Rapid urine drug screen (hospital performed)     Status: Abnormal   Collection Time: 11/14/18  5:37 AM  Result Value Ref Range   Opiates POSITIVE (A) NONE DETECTED   Cocaine NONE DETECTED NONE DETECTED   Benzodiazepines POSITIVE (A) NONE DETECTED   Amphetamines NONE DETECTED NONE DETECTED   Tetrahydrocannabinol NONE DETECTED NONE DETECTED   Barbiturates NONE DETECTED NONE DETECTED  CBG monitoring, ED     Status: Abnormal   Collection Time: 11/14/18  6:08 AM  Result Value Ref Range   Glucose-Capillary 122 (H) 70 - 99 mg/dL   Medical history and chart was reviewed  Assessment/Plan: 68 year old male with history of hepatitis C with history of polysubstance abuse including alcoholism and IV heroin abuse with a displaced bicondylar tibial plateau fracture.  Due to the displacement of his injuries I feel that open reduction internal fixation is most appropriate.  I will perform a single incision approach with a lateral plate.  Risks and benefits were discussed with the patient as well as his wife.  The patient is unable to consent but the wife was able to consent over the phone.  Risks included but not limited to bleeding, infection, malunion, nonunion, hardware failure, posttraumatic arthritis, stiffness, wound healing problems, DVT even the possibility loss of limb or heart attack or stroke.  Patient is wife agrees and consent was signed.  The patient is at high risk for postoperative complications due to his drinking.  He is highly likely to go into DTs and withdrawal.  I feel that if we do not fix it at this point he will be at high risk  and we will not be able to get fix for another few days.   Roby Lofts, MD [facility] [phone] (phone)

## 2018-11-14 NOTE — Anesthesia Procedure Notes (Signed)
Procedure Name: Intubation Date/Time: 11/14/2018 8:57 AM Performed by: Candis Shine, CRNA Pre-anesthesia Checklist: Patient identified, Emergency Drugs available, Suction available and Patient being monitored Patient Re-evaluated:Patient Re-evaluated prior to induction Oxygen Delivery Method: Circle System Utilized Preoxygenation: Pre-oxygenation with 100% oxygen Induction Type: IV induction and Rapid sequence Laryngoscope Size: Mac and 4 Grade View: Grade I Tube type: Oral Tube size: 7.5 mm Number of attempts: 1 Airway Equipment and Method: Stylet Placement Confirmation: ETT inserted through vocal cords under direct vision,  positive ETCO2 and breath sounds checked- equal and bilateral Secured at: 22 cm Tube secured with: Tape Dental Injury: Teeth and Oropharynx as per pre-operative assessment

## 2018-11-14 NOTE — Discharge Instructions (Signed)
[facility] Discharge Instructions   General Discharge Instructions  WEIGHT BEARING STATUS: Non-weightbearing on right leg  RANGE OF MOTION/ACTIVITY: Okay to unlock hinge knee brace to work on knee range of motion during the day. Lock brace in full extension at night  Wound Care: Can take dressing off on post-op day #2 and can leave incisions open to air if there is no drainage. If incisions continue to drain, follow wound care instructions below  DVT/PE prophylaxis: Lovenox daily  Diet: as you were eating previously.  Can use over the counter stool softeners and bowel preparations, such as Miralax, to help with bowel movements.  Narcotics can be constipating.  Be sure to drink plenty of fluids  PAIN MEDICATION USE AND EXPECTATIONS  You have likely been given narcotic medications to help control your pain.  After a traumatic event that results in an fracture (broken bone) with or without surgery, it is ok to use narcotic pain medications to help control one's pain.  We understand that everyone responds to pain differently and each individual patient will be evaluated on a regular basis for the continued need for narcotic medications. Ideally, narcotic medication use should last no more than 6-8 weeks (coinciding with fracture healing).   As a patient it is your responsibility as well to monitor narcotic medication use and report the amount and frequency you use these medications when you come to your office visit.   We would also advise that if you are using narcotic medications, you should take a dose prior to therapy to maximize you participation.  IF YOU ARE ON NARCOTIC MEDICATIONS IT IS NOT PERMISSIBLE TO OPERATE A MOTOR VEHICLE (MOTORCYCLE/CAR/TRUCK/MOPED) OR HEAVY MACHINERY DO NOT MIX NARCOTICS WITH OTHER CNS (CENTRAL NERVOUS SYSTEM) DEPRESSANTS SUCH AS ALCOHOL   STOP SMOKING OR USING NICOTINE PRODUCTS!!!!  As discussed nicotine severely impairs your body's ability  to heal surgical and traumatic wounds but also impairs bone healing.  Wounds and bone heal by forming microscopic blood vessels (angiogenesis) and nicotine is a vasoconstrictor (essentially, shrinks blood vessels).  Therefore, if vasoconstriction occurs to these microscopic blood vessels they essentially disappear and are unable to deliver necessary nutrients to the healing tissue.  This is one modifiable factor that you can do to dramatically increase your chances of healing your injury.    (This means no smoking, no nicotine gum, patches, etc)  DO NOT USE NONSTEROIDAL ANTI-INFLAMMATORY DRUGS (NSAID'S)  Using products such as Advil (ibuprofen), Aleve (naproxen), Motrin (ibuprofen) for additional pain control during fracture healing can delay and/or prevent the healing response.  If you would like to take over the counter (OTC) medication, Tylenol (acetaminophen) is ok.  However, some narcotic medications that are given for pain control contain acetaminophen as well. Therefore, you should not exceed more than 4000 mg of tylenol in a day if you do not have liver disease.  Also note that there are may OTC medicines, such as cold medicines and allergy medicines that my contain tylenol as well.  If you have any questions about medications and/or interactions please ask your doctor/PA or your pharmacist.      ICE AND ELEVATE INJURED/OPERATIVE EXTREMITY  Using ice and elevating the injured extremity above your heart can help with swelling and pain control.  Icing in a pulsatile fashion, such as 20 minutes on and 20 minutes off, can be followed.    Do not place ice directly on skin. Make sure there is a barrier between to skin and the  ice pack.    Using frozen items such as frozen peas works well as the conform nicely to the are that needs to be iced.  USE AN ACE WRAP OR TED HOSE FOR SWELLING CONTROL  In addition to icing and elevation, Ace wraps or TED hose are used to help limit and resolve swelling.  It is  recommended to use Ace wraps or TED hose until you are informed to stop.    When using Ace Wraps start the wrapping distally (farthest away from the body) and wrap proximally (closer to the body)   Example: If you had surgery on your leg or thing and you do not have a splint on, start the ace wrap at the toes and work your way up to the thigh        If you had surgery on your upper extremity and do not have a splint on, start the ace wrap at your fingers and work your way up to the upper arm    CALL THE OFFICE WITH ANY QUESTIONS OR CONCERNS: [phone]   VISIT OUR WEBSITE FOR ADDITIONAL INFORMATION: [email or web address]     Discharge Wound Care Instructions  Do NOT apply any ointments, solutions or lotions to pin sites or surgical wounds.  These prevent needed drainage and even though solutions like hydrogen peroxide kill bacteria, they also damage cells lining the pin sites that help fight infection.  Applying lotions or ointments can keep the wounds moist and can cause them to breakdown and open up as well. This can increase the risk for infection. When in doubt call the office.  Surgical incisions should be dressed daily.  If any drainage is noted, use one layer of adaptic, then gauze, Kerlix, and an ace wrap.  Once the incision is completely dry and without drainage, it may be left open to air out.  Showering may begin 36-48 hours later.  Cleaning gently with soap and water.  Traumatic wounds should be dressed daily as well.    One layer of adaptic, gauze, Kerlix, then ace wrap.  The adaptic can be discontinued once the draining has ceased    If you have a wet to dry dressing: wet the gauze with saline the squeeze as much saline out so the gauze is moist (not soaking wet), place moistened gauze over wound, then place a dry gauze over the moist one, followed by Kerlix wrap, then ace wrap.

## 2018-11-15 ENCOUNTER — Encounter (HOSPITAL_COMMUNITY): Payer: Self-pay | Admitting: Student

## 2018-11-15 LAB — GLUCOSE, CAPILLARY
Glucose-Capillary: 103 mg/dL — ABNORMAL HIGH (ref 70–99)
Glucose-Capillary: 107 mg/dL — ABNORMAL HIGH (ref 70–99)
Glucose-Capillary: 107 mg/dL — ABNORMAL HIGH (ref 70–99)
Glucose-Capillary: 112 mg/dL — ABNORMAL HIGH (ref 70–99)
Glucose-Capillary: 120 mg/dL — ABNORMAL HIGH (ref 70–99)
Glucose-Capillary: 96 mg/dL (ref 70–99)
Glucose-Capillary: 98 mg/dL (ref 70–99)

## 2018-11-15 LAB — CBC
HCT: 35.8 % — ABNORMAL LOW (ref 39.0–52.0)
Hemoglobin: 11.9 g/dL — ABNORMAL LOW (ref 13.0–17.0)
MCH: 27.7 pg (ref 26.0–34.0)
MCHC: 33.2 g/dL (ref 30.0–36.0)
MCV: 83.3 fL (ref 80.0–100.0)
Platelets: 209 10*3/uL (ref 150–400)
RBC: 4.3 MIL/uL (ref 4.22–5.81)
RDW: 14.5 % (ref 11.5–15.5)
WBC: 14.2 10*3/uL — ABNORMAL HIGH (ref 4.0–10.5)
nRBC: 0 % (ref 0.0–0.2)

## 2018-11-15 MED ORDER — VITAMIN D 25 MCG (1000 UNIT) PO TABS
4000.0000 [IU] | ORAL_TABLET | Freq: Every day | ORAL | Status: DC
  Administered 2018-11-15 – 2018-11-16 (×2): 4000 [IU] via ORAL
  Filled 2018-11-15 (×3): qty 4

## 2018-11-15 MED ORDER — ENSURE ENLIVE PO LIQD
237.0000 mL | Freq: Two times a day (BID) | ORAL | Status: DC
  Administered 2018-11-15 – 2018-11-16 (×3): 237 mL via ORAL

## 2018-11-15 MED ORDER — NICOTINE 21 MG/24HR TD PT24
21.0000 mg | MEDICATED_PATCH | Freq: Every day | TRANSDERMAL | Status: DC
  Administered 2018-11-15 – 2018-11-16 (×2): 21 mg via TRANSDERMAL
  Filled 2018-11-15 (×2): qty 1

## 2018-11-15 MED FILL — Tobramycin Sulfate For Inj 1.2 GM: INTRAMUSCULAR | Qty: 1.2 | Status: AC

## 2018-11-15 NOTE — Progress Notes (Signed)
Patient suffers from right tibia fracture which impairs their ability to perform daily activities like ADL's and walking in the home.  A walker alone will not resolve the issues with performing activities of daily living. A wheelchair will allow patient to safely perform daily activities.  The patient can self propel in the home or has a caregiver who can provide assistance.     Laurina Bustle, PT, DPT Acute Rehabilitation Services Pager [phone] Office [phone]

## 2018-11-15 NOTE — Progress Notes (Signed)
PROGRESS NOTE    Bryan Powell  ZOX:[id number] DOB: 04-14-1951 DOA: 11/13/2018 PCP: Thayer Headings, MD (Inactive)    Brief Narrative:   Assessment & Plan:   Principal Problem:   Closed bicondylar fracture of right tibial plateau Active Problems:   Polysubstance abuse (HCC)   Closed right bicondylar tibial plateau fracture s/p ORIF on 6/30,  NWB RLE. Hinged knee brace locked in extension at night. Unlocked during the day. Dressing change tomorrow.  Pain control and physical therapy evaluation recommending home health PT with wheelchair.    Polysubstance abuse: - social work consulted for resources.   Tobacco abuse:  On nicotine patch.    Hepatitis C:  Outpatient follow up with pcp.        DVT prophylaxis: lovenox.  Code Status: full code.  Family Communication: none at bedside.  Disposition Plan: pending clinical improvement.    Consultants:   Orthopedics    Procedures:    Antimicrobials: one dose of ancef.   Subjective: No chest pain or sob. Pain well controlled.  Wants to go home.   Objective: Vitals:   11/15/18 0900 11/15/18 1000 11/15/18 1100 11/15/18 1200  BP:    (!) 156/82  Pulse: 70 87 72 78  Resp: 15 (!) 24 (!) 24 20  Temp:    98.5 F (36.9 C)  TempSrc:    Oral  SpO2: 99% 98% 97% 98%    Intake/Output Summary (Last 24 hours) at 11/15/2018 1604 Last data filed at 11/15/2018 1255 Gross per 24 hour  Intake 796.46 ml  Output 2100 ml  Net -1303.54 ml   There were no vitals filed for this visit.  Examination:  General exam: Appears calm and comfortable  Respiratory system: Clear to auscultation. Respiratory effort normal. Cardiovascular system: S1 & S2 heard, RRR. No JVD, Gastrointestinal system: Abdomen is nondistended, soft and nontender. No organomegaly or masses felt. Normal bowel sounds heard. Central nervous system: Alert and oriented. No focal neurological deficits. Extremities: right tibia and fibular bandaged.  Skin: No  rashes, lesions or ulcers Psychiatry: . Mood & affect appropriate.     Data Reviewed: I have personally reviewed following labs and imaging studies  CBC: Recent Labs  Lab 11/14/18 0217 11/15/18 0149  WBC 13.5* 14.2*  NEUTROABS 8.9*  --   HGB 13.1 11.9*  HCT 39.6 35.8*  MCV 83.9 83.3  PLT 259 209   Basic Metabolic Panel: Recent Labs  Lab 11/14/18 0217  NA 136  K 3.9  CL 101  CO2 24  GLUCOSE 111*  BUN 10  CREATININE 0.68  CALCIUM 9.1   GFR: CrCl cannot be calculated (Unknown ideal weight.). Liver Function Tests: No results for input(s): AST, ALT, ALKPHOS, BILITOT, PROT, ALBUMIN in the last 168 hours. No results for input(s): LIPASE, AMYLASE in the last 168 hours. No results for input(s): AMMONIA in the last 168 hours. Coagulation Profile: Recent Labs  Lab 11/14/18 0502  INR 1.2   Cardiac Enzymes: No results for input(s): CKTOTAL, CKMB, CKMBINDEX, TROPONINI in the last 168 hours. BNP (last 3 results) No results for input(s): PROBNP in the last 8760 hours. HbA1C: No results for input(s): HGBA1C in the last 72 hours. CBG: Recent Labs  Lab 11/14/18 1602 11/15/18 0027 11/15/18 0411 11/15/18 0824 11/15/18 1321  GLUCAP 124* 103* 112* 107* 120*   Lipid Profile: No results for input(s): CHOL, HDL, LDLCALC, TRIG, CHOLHDL, LDLDIRECT in the last 72 hours. Thyroid Function Tests: No results for input(s): TSH, T4TOTAL, FREET4, T3FREE, THYROIDAB in the last  72 hours. Anemia Panel: No results for input(s): VITAMINB12, FOLATE, FERRITIN, TIBC, IRON, RETICCTPCT in the last 72 hours. Sepsis Labs: No results for input(s): PROCALCITON, LATICACIDVEN in the last 168 hours.  Recent Results (from the past 240 hour(s))  SARS Coronavirus 2 (CEPHEID - Performed in [facility] lab), Hosp Order     Status: None   Collection Time: 11/14/18  2:21 AM  Result Value Ref Range Status   SARS Coronavirus 2 NEGATIVE NEGATIVE Final    Comment: (NOTE) If result is  NEGATIVE SARS-CoV-2 target nucleic acids are NOT DETECTED. The SARS-CoV-2 RNA is generally detectable in upper and lower  respiratory specimens during the acute phase of infection. The lowest  concentration of SARS-CoV-2 viral copies this assay can detect is 250  copies / mL. A negative result does not preclude SARS-CoV-2 infection  and should not be used as the sole basis for treatment or other  patient management decisions.  A negative result may occur with  improper specimen collection / handling, submission of specimen other  than nasopharyngeal swab, presence of viral mutation(s) within the  areas targeted by this assay, and inadequate number of viral copies  (<250 copies / mL). A negative result must be combined with clinical  observations, patient history, and epidemiological information. If result is POSITIVE SARS-CoV-2 target nucleic acids are DETECTED. The SARS-CoV-2 RNA is generally detectable in upper and lower  respiratory specimens dur ing the acute phase of infection.  Positive  results are indicative of active infection with SARS-CoV-2.  Clinical  correlation with patient history and other diagnostic information is  necessary to determine patient infection status.  Positive results do  not rule out bacterial infection or co-infection with other viruses. If result is PRESUMPTIVE POSTIVE SARS-CoV-2 nucleic acids MAY BE PRESENT.   A presumptive positive result was obtained on the submitted specimen  and confirmed on repeat testing.  While 2019 novel coronavirus  (SARS-CoV-2) nucleic acids may be present in the submitted sample  additional confirmatory testing may be necessary for epidemiological  and / or clinical management purposes  to differentiate between  SARS-CoV-2 and other Sarbecovirus currently known to infect humans.  If clinically indicated additional testing with an alternate test  methodology [phone]([id number]) is advised. The SARS-CoV-2 RNA is generally  detectable  in upper and lower respiratory sp ecimens during the acute  phase of infection. The expected result is Negative. Fact Sheet for Patients:  BoilerBrush.com.cyhttps://www.fda.gov/media/136312/download Fact Sheet for Healthcare Providers: https://pope.com/https://www.fda.gov/media/136313/download This test is not yet approved or cleared by the [place] FDA and has been authorized for detection and/or diagnosis of SARS-CoV-2 by FDA under an Emergency Use Authorization (EUA).  This EUA will remain in effect (meaning this test can be used) for the duration of the COVID-19 declaration under Section 564(b)(1) of the Act, 21 U.S.C. section 360bbb-3(b)(1), unless the authorization is terminated or revoked sooner. Performed at [facility], [address] [place], [place] [address]          Radiology Studies: Dg Ribs Unilateral W/chest Right  Result Date: 11/14/2018 CLINICAL DATA:  Pain status post fall EXAM: RIGHT RIBS AND CHEST - 3+ VIEW COMPARISON:  11/13/2016. FINDINGS: There is a stable old sclerotic lesion in the proximal left humerus. There are old healed bilateral rib fractures. There is an acute fracture involving the fifth and sixth ribs anterior laterally on the right. No evidence of a right-sided pneumothorax. The cardiac silhouette is mildly enlarged. There is a subacute fracture involving the  seventh rib anterior laterally on the right. IMPRESSION: 1. Acute mildly displaced fractures involving the fifth and sixth ribs anterior laterally on the right. 2. Subacute appearing fracture involving the seventh rib on the right. 3. No pneumothorax. 4. Multiple old healed bilateral rib fractures are again noted. Electronically Signed   By: Katherine Mantle M.D.   On: 11/14/2018 01:53   Dg Tibia/fibula Right  Result Date: 11/13/2018 CLINICAL DATA:  Right lower leg pain after fall from ladder. EXAM: RIGHT TIBIA AND FIBULA - 2 VIEW COMPARISON:  None. FINDINGS: Severely comminuted and displaced fracture is seen  involving the lateral tibial plateau and proximal tibial shaft with intra-articular extension. Severely comminuted and displaced fracture is seen involving the proximal right fibula as well. IMPRESSION: Severely comminuted and displaced fractures are seen involving the right lateral tibial plateau and proximal tibial shaft, as well as the proximal right fibula. Electronically Signed   By: Lupita Raider M.D.   On: 11/13/2018 19:35   Ct Knee Right Wo Contrast  Result Date: 11/14/2018 CLINICAL DATA:  Fracture. EXAM: CT OF THE right KNEE WITHOUT CONTRAST TECHNIQUE: Multidetector CT imaging of the right knee was performed according to the standard protocol. Multiplanar CT image reconstructions were also generated. COMPARISON:  X-ray from 11/13/2018 FINDINGS: Bones/Joint/Cartilage Again identified is a highly comminuted impacted fracture involving the proximal tibia. The fracture planes extend through the lateral and medial tibial plateaus. There is a highly comminuted fracture involving the proximal fibula. There is a large joint effusion with evidence of lipohemarthrosis. Ligaments Suboptimally assessed by CT. Muscles and Tendons There is extensive surrounding soft tissue swelling. A large joint effusion with lipohemarthrosis is noted. Soft tissues Surrounding soft tissue swelling is noted. IMPRESSION: Again noted is a highly comminuted impacted fracture of proximal tibia with fracture planes extending through the articular surfaces of the lateral and medial tibial plateaus. Highly comminuted fracture of the proximal fibula. Large joint effusion with lipohemarthrosis. Electronically Signed   By: Katherine Mantle M.D.   On: 11/14/2018 01:51   Dg Knee Complete 4 Views Right  Result Date: 11/14/2018 CLINICAL DATA:  ORIF tibial fracture. EXAM: RIGHT KNEE - COMPLETE 4+ VIEW; DG C-ARM 61-120 MIN COMPARISON:  Multiple films over the last 2 days. FINDINGS: Multiple C-arm images show progressive ORIF of proximal tibial  fracture with lateral plate and screws with restoration a nearly normal tibial plateau. No hardware in the proximal fibula which appears unchanged. IMPRESSION: ORIF of proximal tibial fracture. Electronically Signed   By: Paulina Fusi M.D.   On: 11/14/2018 11:05   Dg Knee Complete 4 Views Right  Result Date: 11/13/2018 CLINICAL DATA:  Right leg pain after fall from ladder. EXAM: RIGHT KNEE - COMPLETE 4+ VIEW COMPARISON:  None. FINDINGS: Severely comminuted and displaced fracture is seen involving the lateral tibial plateau and proximal tibial shaft. Also noted is severely comminuted and displaced fracture involving the proximal fibula. Visualized portion of the femur and patella are unremarkable. IMPRESSION: Severely comminuted and displaced fractures are seen involving the lateral tibial plateau and proximal tibial shaft with intra-articular extension, as well as the proximal fibula. Electronically Signed   By: Lupita Raider M.D.   On: 11/13/2018 19:33   Dg Knee Right Port  Result Date: 11/14/2018 CLINICAL DATA:  Status post fixation of a right tibial plateau fracture which the patient suffered 11/13/2018 due to a fall from a ladder. Initial encounter. EXAM: PORTABLE RIGHT KNEE - 1-2 VIEW COMPARISON:  Plain films right knee 11/13/2018 and  CT scan 11/14/2018. FINDINGS: Comminuted tibial plateau fracture has been fixed with lateral plate and screws and a single interfragmentary screw in the medial plateau from an anterior approach. Hardware is intact. Position and alignment are markedly improved. No acute abnormality. Comminuted fibular head and neck fracture noted as seen on the prior exams. Vac drain is in place. IMPRESSION: Status post ORIF of a comminuted proximal right tibial fracture. Position alignment are markedly improved. No acute abnormality. Electronically Signed   By: Drusilla Kanner M.D.   On: 11/14/2018 13:51   Dg C-arm 1-60 Min  Result Date: 11/14/2018 CLINICAL DATA:  ORIF tibial fracture.  EXAM: RIGHT KNEE - COMPLETE 4+ VIEW; DG C-ARM 61-120 MIN COMPARISON:  Multiple films over the last 2 days. FINDINGS: Multiple C-arm images show progressive ORIF of proximal tibial fracture with lateral plate and screws with restoration a nearly normal tibial plateau. No hardware in the proximal fibula which appears unchanged. IMPRESSION: ORIF of proximal tibial fracture. Electronically Signed   By: Paulina Fusi M.D.   On: 11/14/2018 11:05        Scheduled Meds:  acetaminophen  650 mg Oral Q6H   cholecalciferol  4,000 Units Oral Daily   enoxaparin (LOVENOX) injection  40 mg Subcutaneous Q24H   esomeprazole  20 mg Oral Q1200   feeding supplement (ENSURE ENLIVE)  237 mL Oral BID BM   folic acid  1 mg Oral Daily   gabapentin  100 mg Oral TID   LORazepam  0-4 mg Intravenous Q6H   Followed by   Melene Muller ON 11/16/2018] LORazepam  0-4 mg Intravenous Q12H   multivitamin with minerals  1 tablet Oral Daily   nicotine  21 mg Transdermal Daily   sodium chloride flush  10-40 mL Intracatheter Q12H   thiamine  100 mg Oral Daily   Or   thiamine  100 mg Intravenous Daily   Continuous Infusions:   LOS: 1 day    Time spent: 32 minutes.    Kathlen Mody, MD [facility] Pager [phone]   If 7PM-7AM, please contact night-coverage [email or web address] Password [email or web address] 11/15/2018, 4:04 PM

## 2018-11-15 NOTE — Plan of Care (Signed)

## 2018-11-15 NOTE — Progress Notes (Signed)
Orthopaedic Trauma Progress Note  S: Patient doing fairly well this morning, pain seems to be well controlled. Describes soreness in his knee but then states the pain is 8/10 and requests pain medication. Asks multiple times if he can leave today.   O:  Vitals:   11/15/18 0000 11/15/18 0409  BP: (!) 149/66   Pulse: 65   Resp: (!) 25   Temp:  98.7 F (37.1 C)  SpO2: 98%     General - Sleeping in bed comfortably. NAD Cardiac - Heart regular rate and rhythm Respiratory - No increased work of breathing, lungs CTA anterior lung fields bilaterally Right Lower Extremity - Hinge knee brace in place. Dressing clean, dry, intact. Decent amount of swelling in foot and lower leg. Incisional vac functioning well, no output noted in canister. Minimally tender over the knee. Non-tender in the thigh, lower leg, foot. Able to get some dorsiflexion/planaterflexion. No pain with passive stretch. Wiggles toes. Foot warm and well perfused. +DP pulse  Imaging: Stable post op imaging  Labs:  Results for orders placed or performed during the hospital encounter of 11/13/18 (from the past 24 hour(s))  Glucose, capillary     Status: Abnormal   Collection Time: 11/14/18  4:02 PM  Result Value Ref Range   Glucose-Capillary 124 (H) 70 - 99 mg/dL  Glucose, capillary     Status: Abnormal   Collection Time: 11/15/18 12:27 AM  Result Value Ref Range   Glucose-Capillary 103 (H) 70 - 99 mg/dL  CBC     Status: Abnormal   Collection Time: 11/15/18  1:49 AM  Result Value Ref Range   WBC 14.2 (H) 4.0 - 10.5 K/uL   RBC 4.30 4.22 - 5.81 MIL/uL   Hemoglobin 11.9 (L) 13.0 - 17.0 g/dL   HCT 83.2 (L) 54.9 - 82.6 %   MCV 83.3 80.0 - 100.0 fL   MCH 27.7 26.0 - 34.0 pg   MCHC 33.2 30.0 - 36.0 g/dL   RDW 41.5 83.0 - 94.0 %   Platelets 209 150 - 400 K/uL   nRBC 0.0 0.0 - 0.2 %  Glucose, capillary     Status: Abnormal   Collection Time: 11/15/18  4:11 AM  Result Value Ref Range   Glucose-Capillary 112 (H) 70 - 99 mg/dL     Assessment: 68 year old male s/p fall from a ladder  Injuries: 1. Right bicondylar tibial plateau fracture s/p ORIF on 11/14/18 2. Right tibial shaft fracture s/p ORIF on 11/14/18  Weightbearing: NWB RLE  Insicional and dressing care: Dressing clean, dry, intact. Do not remove incisional vac. Will plan to change dressing tomorrow  Orthopedic device(s): Hinge knee brace RLE  CV/Blood loss: ABLA, Hgb 11.9 this AM. Hemodynamically stable  Pain management:  1. Tylenol 650 mg q 6 hours scheduled 2. Robaxin 500 mg q 6 hours PRN 3. Oxycodone 5-10 mg q 4 hours PRN 4. Neurontin 100 mg TID 5. Dilaudid 1 mg q 2 hours PRN  VTE prophylaxis: lovenox starting today  ID: Ancef 2gm post op completed  Foley/Lines: No foley, KVO IVFs  Medical co-morbidities: Hepatitis C previously treated, GERD, Lower extremity venous stasis, polysubstance abuse including IV heroin alcohol and tobacco   Impediments to Fracture Healing: Tobacco abuse  Dispo: PT eval, dispo pending. Okay to d/c from ortho perspective once medically stable  Follow - up plan: 2 weeks   Arsalan Brisbin A. [facility] ?([phone]? (phone)

## 2018-11-15 NOTE — Evaluation (Addendum)
Physical Therapy Evaluation Patient Details Name: Bryan Powell MRN: [id number] DOB: 01/31/1951 Today's Date: 11/15/2018   History of Present Illness  Pt is a 68 y.o. M with significant PMH of Hepatitis C, polysubstance abuse including IV heroin, alcohol and tobacco, who presents after a fall trying to clean his gutters. Admitted with right bicondylar tibial plateau fx s/p ORIF 6/3 and right tibial shaft fx s/p ORIF 6/3.  Clinical Impression  Pt admitted with above. Pt presents with decreased functional mobility secondary to right lower extremity pain, weakness, decreased range of motion, and change in weightbearing status. Pt requiring min assist for transfers and unable to hop. Performed stand pivot from bed to chair with walker and mod assist. Pt asking multiple times to go home tomorrow. Currently, recommending wheelchair for all mobility as pt presents as high fall risk. Pt was provided crutches in hospital, but highly discouraged use as pt has poor balance and has difficulty maintaining weightbearing precautions. Will need HHPT and increased assistance at home.    Follow Up Recommendations Home health PT;Supervision for mobility/OOB    Equipment Recommendations  Wheelchair (measurements PT);Wheelchair cushion (measurements PT); elevating legrests   Recommendations for Other Services OT consult     Precautions / Restrictions Precautions Precautions: Fall;Other (comment) Precaution Comments: wound vac Required Braces or Orthoses: Other Brace Other Brace: Right hinged knee brace. Unlocked during day, locked in extension at night Restrictions Weight Bearing Restrictions: Yes RLE Weight Bearing: Non weight bearing      Mobility  Bed Mobility Overal bed mobility: Needs Assistance Bed Mobility: Supine to Sit     Supine to sit: Min assist     General bed mobility comments: MinA for RLE negotiation off of bed  Transfers Overall transfer level: Needs assistance Equipment  used: Rolling walker (2 wheeled) Transfers: Sit to/from [employer] Sit to Stand: Min assist Stand pivot transfers: Mod assist       General transfer comment: MinA to stand from edge of bed, cues for hand and foot placement. ModA for stand pivot from bed to recliner, pt with minimal push off through arms, decreased left foot clearance. Cues provided for sequencing, weightbearing status  Ambulation/Gait                Stairs            Wheelchair Mobility    Modified Rankin (Stroke Patients Only)       Balance Overall balance assessment: Needs assistance   Sitting balance-Leahy Scale: Good     Standing balance support: Bilateral upper extremity supported Standing balance-Leahy Scale: Poor                               Pertinent Vitals/Pain Pain Assessment: 0-10 Pain Score: 8  Pain Location: R anterior knee Pain Intervention(s): Monitored during session;Limited activity within patient's tolerance;Premedicated before session    Home Living Family/patient expects to be discharged to:: Private residence Living Arrangements: Spouse/significant other;Other (Comment);Children(daughter, granchildren (4, 7 y.o.)) Available Help at Discharge: Family Type of Home: House Home Access: Stairs to enter   Secretary/administrator of Steps: 2-3 Home Layout: Able to live on main level with bedroom/bathroom Home Equipment: Crutches Additional Comments: 1/2 bath on first floor    Prior Function Level of Independence: Independent               Hand Dominance   Dominant Hand: Right    Extremity/Trunk Assessment   Upper Extremity  Assessment Upper Extremity Assessment: Overall WFL for tasks assessed    Lower Extremity Assessment Lower Extremity Assessment: RLE deficits/detail RLE Deficits / Details: s/p ORIF. Increased edema, able to wiggle toes       Communication   Communication: No difficulties  Cognition Arousal/Alertness:  Awake/alert Behavior During Therapy: Flat affect Overall Cognitive Status: Impaired/Different from baseline Area of Impairment: Problem solving;Safety/judgement                         Safety/Judgement: Decreased awareness of deficits   Problem Solving: Decreased initiation;Difficulty sequencing;Slow processing;Requires verbal cues        General Comments      Exercises     Assessment/Plan    PT Assessment Patient needs continued PT services  PT Problem List Decreased strength;Decreased range of motion;Decreased activity tolerance;Decreased balance;Decreased mobility;Decreased safety awareness;Pain       PT Treatment Interventions DME instruction;Gait training;Therapeutic exercise;Therapeutic activities;Stair training;Functional mobility training;Balance training;Patient/family education    PT Goals (Current goals can be found in the Care Plan section)  Acute Rehab PT Goals Patient Stated Goal: "go home tomorrow." PT Goal Formulation: With patient Time For Goal Achievement: 11/29/18 Potential to Achieve Goals: Good    Frequency Min 5X/week   Barriers to discharge        Co-evaluation               AM-PAC PT "6 Clicks" Mobility  Outcome Measure Help needed turning from your back to your side while in a flat bed without using bedrails?: None Help needed moving from lying on your back to sitting on the side of a flat bed without using bedrails?: A Little Help needed moving to and from a bed to a chair (including a wheelchair)?: A Lot Help needed standing up from a chair using your arms (e.g., wheelchair or bedside chair)?: A Little Help needed to walk in hospital room?: A Lot Help needed climbing 3-5 steps with a railing? : Total 6 Click Score: 15    End of Session Equipment Utilized During Treatment: Gait belt Activity Tolerance: Patient tolerated treatment well Patient left: in chair;with call bell/phone within reach Nurse Communication: Mobility  status PT Visit Diagnosis: Other abnormalities of gait and mobility (R26.89);Unsteadiness on feet (R26.81);Difficulty in walking, not elsewhere classified (R26.2);Pain Pain - Right/Left: Right Pain - part of body: Knee    Time: [id number] PT Time Calculation (min) (ACUTE ONLY): 29 min   Charges:   PT Evaluation $PT Eval Moderate Complexity: 1 Mod PT Treatments $Gait Training: 8-22 mins        Laurina Bustlearoline Reka Wist, PT, DPT Acute Rehabilitation Services Pager [phone] Office [phone]   Vanetta MuldersCarloine H Gillie Fleites 11/15/2018, 1:37 PM

## 2018-11-15 NOTE — Progress Notes (Signed)
Initial Nutrition Assessment   RD working remotely.  DOCUMENTATION CODES:   Not applicable  INTERVENTION:  Provide Ensure Enlive po BID, each supplement provides 350 kcal and 20 grams of protein  Encourage adequate PO intake.   Recommend obtaining new weight to fully assess weight trends.  NUTRITION DIAGNOSIS:   Increased nutrient needs related to post-op healing as evidenced by estimated needs.  GOAL:   Patient will meet greater than or equal to 90% of their needs  MONITOR:   PO intake, Supplement acceptance, Labs, Weight trends, I & O's, Skin  REASON FOR ASSESSMENT:   Consult Assessment of nutrition requirement/status  ASSESSMENT:   68 y.o. male with history of hepatitis C previously treated, polysubstance abuse including IV heroin alcohol and tobacco presents after fall. x-rays of the right side showed rib fracture involving the and also subacute fracture of the right 7.  CT of the right knee shows comminuted fracture of the proximal tibia and fibula.  Right bicondylar tibial plateau fracture and right tibial shaft fracture s/p ORIF 6/3.   Pt unavailable during attempted time of contact. Diet has been advanced to a regular diet with thin liquids. RD to order nutritional supplements to aid in caloric and protein needs as well as in healing. Noted no new weight recorded. Recommend obtaining new weight to fully assess weight trends.   Unable to complete Nutrition-Focused physical exam at this time.   Labs and medications reviewed.   Diet Order:   Diet Order            Diet regular Room service appropriate? Yes; Fluid consistency: Thin  Diet effective now              EDUCATION NEEDS:   Not appropriate for education at this time  Skin:  Skin Assessment: Skin Integrity Issues: Skin Integrity Issues:: Incisions, Wound VAC Wound Vac: R leg Incisions: R leg  Last BM:  Unknown  Height:   Ht Readings from Last 1 Encounters:  11/13/16 5\' 11"  (1.803 m)     Weight:   Wt Readings from Last 1 Encounters:  11/13/16 73.9 kg    Ideal Body Weight:  78 kg  BMI:  There is no height or weight on file to calculate BMI.  Estimated Nutritional Needs:   Kcal:  2100-2300  Protein:  105-115 grams  Fluid:  >/= 2.1 L/day    Roslyn Smiling, MS, RD, LDN Pager # [phone] After hours/ weekend pager # [phone]

## 2018-11-16 LAB — BASIC METABOLIC PANEL
Anion gap: 12 (ref 5–15)
BUN: 12 mg/dL (ref 8–23)
CO2: 21 mmol/L — ABNORMAL LOW (ref 22–32)
Calcium: 8.6 mg/dL — ABNORMAL LOW (ref 8.9–10.3)
Chloride: 103 mmol/L (ref 98–111)
Creatinine, Ser: 0.58 mg/dL — ABNORMAL LOW (ref 0.61–1.24)
GFR calc Af Amer: >60 mL/min (ref >60)
GFR calc non Af Amer: >60 mL/min (ref >60)
Glucose, Bld: 107 mg/dL — ABNORMAL HIGH (ref 70–99)
Potassium: 3.8 mmol/L (ref 3.5–5.1)
Sodium: 136 mmol/L (ref 135–145)

## 2018-11-16 LAB — GLUCOSE, CAPILLARY
Glucose-Capillary: 99 mg/dL (ref 70–99)
Glucose-Capillary: 112 mg/dL — ABNORMAL HIGH (ref 70–99)

## 2018-11-16 LAB — CBC
HCT: 37.2 % — ABNORMAL LOW (ref 39.0–52.0)
Hemoglobin: 12.4 g/dL — ABNORMAL LOW (ref 13.0–17.0)
MCH: 27.8 pg (ref 26.0–34.0)
MCHC: 33.3 g/dL (ref 30.0–36.0)
MCV: 83.4 fL (ref 80.0–100.0)
Platelets: 230 10*3/uL (ref 150–400)
RBC: 4.46 MIL/uL (ref 4.22–5.81)
RDW: 14.3 % (ref 11.5–15.5)
WBC: 12.3 10*3/uL — ABNORMAL HIGH (ref 4.0–10.5)
nRBC: 0 % (ref 0.0–0.2)

## 2018-11-16 MED ORDER — THIAMINE HCL 100 MG PO TABS: 100.0000 mg | ORAL_TABLET | Freq: Every day | ORAL | 0 refills | Status: DC

## 2018-11-16 MED ORDER — ENSURE ENLIVE PO LIQD: 237.0000 mL | Freq: Two times a day (BID) | ORAL | 12 refills | Status: AC

## 2018-11-16 MED ORDER — FOLIC ACID 1 MG PO TABS: 1.0000 mg | ORAL_TABLET | Freq: Every day | ORAL | 0 refills | Status: DC

## 2018-11-16 MED ORDER — ESOMEPRAZOLE MAGNESIUM 20 MG PO CPDR: 20.0000 mg | DELAYED_RELEASE_CAPSULE | Freq: Every day | ORAL | 0 refills | Status: DC

## 2018-11-16 MED ORDER — NICOTINE 21 MG/24HR TD PT24: 21.0000 mg | MEDICATED_PATCH | Freq: Every day | TRANSDERMAL | 0 refills | Status: DC

## 2018-11-16 MED ORDER — METHOCARBAMOL 500 MG PO TABS: 500.0000 mg | ORAL_TABLET | Freq: Four times a day (QID) | ORAL | 0 refills | Status: DC | PRN

## 2018-11-16 NOTE — Progress Notes (Addendum)
Patient suffers from tibia and fibular fracture which impairs their ability to perform daily activities like ADL 's in the home. A walking aid will not resolve  issue with performing activities of daily living. A wheelchair will allow patient to safely perform daily activities. Patient is not able to propel themselves in the home using a standard weight wheelchair due to weakness. Patient can self propel in the lightweight wheelchair.  Accessories: elevating leg rests (ELRs), wheel locks, extensions and anti-tippers.  Hortencia Conradi, RN MSN CCM Transitions of Care [phone]

## 2018-11-16 NOTE — Progress Notes (Signed)
Orthopaedic Trauma Progress Note  S: Patient doing well this morning, states pain is well controlled and better than yesterday. Wants to go home today. Has no concerns or questions at this time.   O:  Vitals:   11/15/18 2315 11/16/18 0344  BP: (!) 167/76 (!) 150/88  Pulse: 71 68  Resp: 11 (!) 26  Temp: 98.4 F (36.9 C) 97.6 F (36.4 C)  SpO2: 99% 97%    General - Sleeping in bed comfortably. NAD Cardiac - Heart regular rate and rhythm Respiratory - No increased work of breathing, lungs CTA anterior lung fields bilaterally Right Lower Extremity - Hinge knee brace in place. Dressing and incisional vac removed, incisions clean, dry, intact. Foot and lower leg swelling improved some from yesterday. Minimally tender over the knee. Non-tender in the thigh, lower leg, foot. Able to get some knee flexion in hinge brace. Dorsiflexion/plantarflexion intact. No pain with passive stretch. Wiggles toes. Foot warm and well perfused. +DP pulse  Imaging: Stable post op imaging  Labs:  Results for orders placed or performed during the hospital encounter of 11/13/18 (from the past 24 hour(s))  Glucose, capillary     Status: Abnormal   Collection Time: 11/15/18  8:24 AM  Result Value Ref Range   Glucose-Capillary 107 (H) 70 - 99 mg/dL  Glucose, capillary     Status: Abnormal   Collection Time: 11/15/18  1:21 PM  Result Value Ref Range   Glucose-Capillary 120 (H) 70 - 99 mg/dL  Glucose, capillary     Status: Abnormal   Collection Time: 11/15/18  4:03 PM  Result Value Ref Range   Glucose-Capillary 107 (H) 70 - 99 mg/dL  Glucose, capillary     Status: None   Collection Time: 11/15/18  8:08 PM  Result Value Ref Range   Glucose-Capillary 96 70 - 99 mg/dL  Glucose, capillary     Status: None   Collection Time: 11/15/18 11:12 PM  Result Value Ref Range   Glucose-Capillary 98 70 - 99 mg/dL   Comment 1 Notify RN    Comment 2 Document in Chart   CBC     Status: Abnormal   Collection Time: 11/16/18   3:10 AM  Result Value Ref Range   WBC 12.3 (H) 4.0 - 10.5 K/uL   RBC 4.46 4.22 - 5.81 MIL/uL   Hemoglobin 12.4 (L) 13.0 - 17.0 g/dL   HCT 16.137.2 (L) 09.639.0 - 04.552.0 %   MCV 83.4 80.0 - 100.0 fL   MCH 27.8 26.0 - 34.0 pg   MCHC 33.3 30.0 - 36.0 g/dL   RDW 40.914.3 81.111.5 - 91.415.5 %   Platelets 230 150 - 400 K/uL   nRBC 0.0 0.0 - 0.2 %  Basic metabolic panel     Status: Abnormal   Collection Time: 11/16/18  3:10 AM  Result Value Ref Range   Sodium 136 135 - 145 mmol/L   Potassium 3.8 3.5 - 5.1 mmol/L   Chloride 103 98 - 111 mmol/L   CO2 21 (L) 22 - 32 mmol/L   Glucose, Bld 107 (H) 70 - 99 mg/dL   BUN 12 8 - 23 mg/dL   Creatinine, Ser 7.820.58 (L) 0.61 - 1.24 mg/dL   Calcium 8.6 (L) 8.9 - 10.3 mg/dL   GFR calc non Af Amer >60 >60 mL/min   GFR calc Af Amer >60 >60 mL/min   Anion gap 12 5 - 15  Glucose, capillary     Status: None   Collection Time: 11/16/18  3:39 AM  Result Value Ref Range   Glucose-Capillary 99 70 - 99 mg/dL   Comment 1 Notify RN    Comment 2 Document in Chart     Assessment: 68 year old male s/p fall from a ladder  Injuries: 1. Right bicondylar tibial plateau fracture s/p ORIF on 11/14/18 2. Right tibial shaft fracture s/p ORIF on 11/14/18  Weightbearing: NWB RLE  Insicional and dressing care: Dressing changed, incisional vac removed. Can change dressing as needed with adaptic, 4x4, and ace wrap.   Orthopedic device(s): Hinge knee brace RLE  CV/Blood loss: Hgb 12.4 this AM. Hemodynamically stable  Pain management:  1. Tylenol 650 mg q 6 hours scheduled 2. Robaxin 500 mg q 6 hours PRN 3. Oxycodone 5-10 mg q 4 hours PRN 4. Neurontin 100 mg TID 5. Dilaudid 1 mg q 2 hours PRN  VTE prophylaxis: lovenox   ID: Ancef 2gm post op completed  Foley/Lines: No foley, KVO IVFs  Medical co-morbidities: Hepatitis C previously treated, GERD, Lower extremity venous stasis, polysubstance abuse including IV heroin alcohol and tobacco   Impediments to Fracture Healing: Tobacco  abuse  Dispo: PT eval, dispo pending. Okay to d/c from ortho perspective once medically stable  Follow - up plan: 2 weeks   Tashanda Fuhrer A. Ladonna Snide [facility] ?[phone]? (phone)

## 2018-11-16 NOTE — Progress Notes (Signed)
Occupational Therapy Treatment and Discharge Patient Details Name: Bryan Powell MRN: [id number] DOB: April 26, 1951 Today's Date: 11/16/2018    History of present illness Pt is a 68 y.o. M with significant PMH of Hepatitis C, polysubstance abuse including IV heroin, alcohol and tobacco, who presents after a fall trying to clean his gutters. Admitted with right bicondylar tibial plateau fx s/p ORIF 6/3 and right tibial shaft fx s/p ORIF 6/3.   OT comments  This 68 yo male seen a second time today to do education with wife outside with pt getting into car. All education completed, we will D/C from acute OT.  Follow Up Recommendations  Home health OT;Supervision/Assistance - 24 hour    Equipment Recommendations  3 in 1 bedside commode       Precautions / Restrictions Precautions Precautions: Fall Required Braces or Orthoses: Other Brace Other Brace: Right hinged knee brace. Unlocked during day, locked in extension at night Restrictions Weight Bearing Restrictions: Yes RLE Weight Bearing: Non weight bearing       Mobility  Transfers Overall transfer level: Needs assistance Equipment used: Rolling walker (2 wheeled) Transfers: Sit to/from Stand Sit to Stand: Min assist Stand pivot transfers: Min assist       General transfer comment: W/C to car    Balance Overall balance assessment: Needs assistance Sitting-balance support: No upper extremity supported;Feet supported Sitting balance-Leahy Scale: Good     Standing balance support: Bilateral upper extremity supported Standing balance-Leahy Scale: Poor Standing balance comment: reliant on RW and additional A from therapist                           ADL either performed or assessed with clinical judgement   ADL     General ADL Comments: Assisted RN staff with taking pt out to car to wife so could educate wife on several things. educated on how to lock and unlock hinged knee brace (locked at night, unlocked  during day), how to use gait belt with pt when he is up on his feet, how to bump pt up and down steps in W/C (handout), when dressing put RLE in first and take out last, how to get in car, how to lock W/C brakes, swing away leg rests, how to remove and put leg rests back on,  how to remove cushion, how to fold up W/C.     Vision Patient Visual Report: No change from baseline            Cognition Arousal/Alertness: Awake/alert Behavior During Therapy: WFL for tasks assessed/performed Overall Cognitive Status: Impaired/Different from baseline Area of Impairment: Problem solving;Safety/judgement                       Safety/Judgement: Decreased awareness of safety;Decreased awareness of deficits   Problem Solving: Difficulty sequencing;Requires verbal cues                   Pertinent Vitals/ Pain       Pain Assessment: Faces Faces Pain Scale: Hurts little more Pain Location: right leg with movement Pain Descriptors / Indicators: Aching;Sore Pain Intervention(s): Limited activity within patient's tolerance;Monitored during session;Repositioned         Frequency  Min 2X/week        Progress Toward Goals  OT Goals(current goals can now be found in the care plan section)  Progress towards OT goals: (education completed with wife)  Acute Rehab OT Goals Patient Stated Goal:  to go home today OT Goal Formulation: With patient Time For Goal Achievement: 11/30/18 Potential to Achieve Goals: Good  Plan Discharge plan remains appropriate       AM-PAC OT "6 Clicks" Daily Activity     Outcome Measure   Help from another person eating meals?: None Help from another person taking care of personal grooming?: A Little Help from another person toileting, which includes using toliet, bedpan, or urinal?: A Lot Help from another person bathing (including washing, rinsing, drying)?: A Lot Help from another person to put on and taking off regular upper body clothing?: A  Little Help from another person to put on and taking off regular lower body clothing?: Total 6 Click Score: 15    End of Session Equipment Utilized During Treatment: Gait belt;Rolling walker  OT Visit Diagnosis: Unsteadiness on feet (R26.81);Other abnormalities of gait and mobility (R26.89);Muscle weakness (generalized) (M62.81);Other symptoms and signs involving cognitive function;Pain Pain - Right/Left: Right Pain - part of body: Leg   Activity Tolerance Patient tolerated treatment well   Patient Left Other (comment)(in car getting ready to leave with wife)   Nurse Communication Mobility status(call me when he is ready to leave so I can go down and educate wife)        Time: [id number] OT Time Calculation (min): 33 min  Charges: OT General Charges $OT Visit: 1 Visit OT Evaluation $OT Eval Moderate Complexity: 1 Mod OT Treatments $Self Care/Home Management : 23-37 mins  ,Bryan Powell, OTR/L [facility] Pager [phone] Office [phone]      Bryan Powell, Bryan Powell 11/16/2018, 4:59 PM

## 2018-11-16 NOTE — Social Work (Addendum)
Attempted to visit pt; OT was in room providing assistance.  When attempted to revisit still was busy. Upon chart review pt has left hospital without 3 in 1 or CSW visit.   CSW signing off. Please consult if any additional needs arise.  Doy Hutching, LCSWA [facility] Clinical Social Work [phone]

## 2018-11-16 NOTE — Plan of Care (Signed)

## 2018-11-16 NOTE — Progress Notes (Signed)
Pt given D/C education and all questions answered. No printed prescriptions to give, wheelchair delivered to room, pt refused to wait for 3 in 1. IV removed. Pt taken to car with all belongings.

## 2018-11-16 NOTE — Evaluation (Signed)
Occupational Therapy Evaluation Patient Details Name: Bryan Powell MRN: [id number] DOB: [birth year] Today's Date: 11/16/2018    History of Present Illness Pt is a 68 y.o. M with significant PMH of Hepatitis C, polysubstance abuse including IV heroin, alcohol and tobacco, who presents after a fall trying to clean his gutters. Admitted with right bicondylar tibial plateau fx s/p ORIF 6/3 and right tibial shaft fx s/p ORIF 6/3.   Clinical Impression   This 68 yo male admitted with above presents to acute OT with decreased balance, increased pain, perservative thoughts--all affecting his safety and independence with basic ADLs. He will benefit from acute OT with follow up HHOT since he is going home today.    Follow Up Recommendations  Home health OT;Supervision/Assistance - 24 hour    Equipment Recommendations  3 in 1 bedside commode       Precautions / Restrictions Precautions Precautions: Fall Required Braces or Orthoses: Other Brace Other Brace: Right hinged knee brace. Unlocked during day, locked in extension at night Restrictions Weight Bearing Restrictions: Yes RLE Weight Bearing: Non weight bearing      Mobility Bed Mobility Overal bed mobility: Needs Assistance Bed Mobility: Supine to Sit;Sit to Supine     Supine to sit: Min assist(for leg) Sit to supine: Min assist(for leg)      Transfers Overall transfer level: Needs assistance Equipment used: Rolling walker (2 wheeled) Transfers: Sit to/from Stand Sit to Stand: Min assist         General transfer comment: Min A to ambulate with RE hopping from bed<>bathroom, cues for safe hand placement, cues to keep weight off of RLE with sit<>stand    Balance Overall balance assessment: Needs assistance Sitting-balance support: No upper extremity supported;Feet supported Sitting balance-Leahy Scale: Good     Standing balance support: Bilateral upper extremity supported Standing balance-Leahy Scale: Poor Standing  balance comment: reliant on RW and additional A from therapist                           ADL either performed or assessed with clinical judgement   ADL Overall ADL's : Needs assistance/impaired Eating/Feeding: Independent;Sitting   Grooming: Set up;Sitting   Upper Body Bathing: Set up;Supervision/ safety;Sitting   Lower Body Bathing: Total assistance Lower Body Bathing Details (indicate cue type and reason): min A sit<>stand Upper Body Dressing : Set up;Supervision/safety;Sitting   Lower Body Dressing: Total assistance Lower Body Dressing Details (indicate cue type and reason): Min A sit<>stand Toilet Transfer: Minimal assistance;Ambulation;RW;BSC Toilet Transfer Details (indicate cue type and reason): over toilet Toileting- Clothing Manipulation and Hygiene: Total assistance Toileting - Clothing Manipulation Details (indicate cue type and reason): min A sit<>stand       General ADL Comments: Educated on how to lock and unlock hinged RLE brace     Vision Patient Visual Report: No change from baseline              Pertinent Vitals/Pain Pain Assessment: Faces Faces Pain Scale: Hurts little more Pain Location: right leg with movement Pain Descriptors / Indicators: Aching;Sore Pain Intervention(s): Limited activity within patient's tolerance;Monitored during session;Repositioned     Hand Dominance Right   Extremity/Trunk Assessment Upper Extremity Assessment Upper Extremity Assessment: Overall WFL for tasks assessed           Communication Communication Communication: No difficulties   Cognition Arousal/Alertness: Awake/alert Behavior During Therapy: Flat affect Overall Cognitive Status: Impaired/Different from baseline Area of Impairment: Memory;Problem solving;Safety/judgement  Memory: Decreased short-term memory;Decreased recall of precautions   Safety/Judgement: Decreased awareness of deficits;Decreased awareness of  safety   Problem Solving: Decreased initiation;Difficulty sequencing;Requires verbal cues General Comments: Pt hyperfocused on when his W/C will arrive (asked me 5 times during our session) as well as will my leg ever get better              Home Living Family/patient expects to be discharged to:: Private residence Living Arrangements: Spouse/significant other;Other (Comment);Children Available Help at Discharge: Family Type of Home: House Home Access: Stairs to enter Entergy CorporationEntrance Stairs-Number of Steps: 2-3   Home Layout: Able to live on main level with bedroom/bathroom     Bathroom Shower/Tub: Producer, television/film/videoWalk-in shower   Bathroom Toilet: Standard     Home Equipment: Crutches   Additional Comments: 1/2 bath on first floor      Prior Functioning/Environment Level of Independence: Independent                 OT Problem List: Decreased range of motion;Impaired balance (sitting and/or standing);Pain;Decreased knowledge of use of DME or AE;Decreased knowledge of precautions      OT Treatment/Interventions: Self-care/ADL training;Balance training;DME and/or AE instruction;Patient/family education    OT Goals(Current goals can be found in the care plan section) Acute Rehab OT Goals Patient Stated Goal: to go home today OT Goal Formulation: With patient Time For Goal Achievement: 11/30/18 Potential to Achieve Goals: Good  OT Frequency: Min 2X/week              AM-PAC OT "6 Clicks" Daily Activity     Outcome Measure Help from another person eating meals?: None Help from another person taking care of personal grooming?: A Little Help from another person toileting, which includes using toliet, bedpan, or urinal?: A Lot Help from another person bathing (including washing, rinsing, drying)?: A Lot Help from another person to put on and taking off regular upper body clothing?: A Little Help from another person to put on and taking off regular lower body clothing?: Total 6 Click  Score: 15   End of Session Equipment Utilized During Treatment: Gait belt;Rolling walker Nurse Communication: Mobility status(call me when he is ready to leave so I can go down and educate wife)  Activity Tolerance: Patient tolerated treatment well Patient left: in bed;with call bell/phone within reach  OT Visit Diagnosis: Unsteadiness on feet (R26.81);Other abnormalities of gait and mobility (R26.89);Muscle weakness (generalized) (M62.81);Other symptoms and signs involving cognitive function;Pain Pain - Right/Left: Right Pain - part of body: Leg                Time: [id number] OT Time Calculation (min): 36 min Charges:  OT General Charges $OT Visit: 1 Visit OT Evaluation $OT Eval Moderate Complexity: 1 Mod OT Treatments $Self Care/Home Management : 8-22 mins  Ignacia Palmaathy Quaneshia Wareing, OTR/L [facility] Pager [phone] Office [phone]     [name], Omar Gayden Eva 11/16/2018, 4:50 PM

## 2018-11-16 NOTE — Discharge Summary (Signed)
Physician Discharge Summary  Bryan Powell ZOX:[id number] DOB: [birth year] DOA: 11/13/2018  PCP: Thayer Headings, MD (Inactive)  Admit date: 11/13/2018 Discharge date: 11/16/2018  Admitted From: Home.  Disposition:  Home  Recommendations for Outpatient Follow-up:  1. Follow up with PCP in 1-2 weeks 2. Please obtain BMP/CBC in one week 3. Please follow up on the following pending results:  Home Health: yes  Equipment/Devices: yes    Discharge Condition:stable.  CODE STATUS:full code.  Diet recommendation: Heart Healthy  Brief/Interim Summary:  Bryan Powell is a 68 y.o. male with history of hepatitis C previously treated, polysubstance abuse including IV heroin alcohol and tobacco had a fall  while cleaning his gutters at home. In the ER x-rays of the right side showed rib fracture involving the and also subacute fracture of the seventh..  CT of the right knee shows comminuted fracture of the proximal tibia and fibula.  On-call orthopedic surgeon Dr. Everardo Pacific has advised hospitalist admission because of history of polysubstance abuse and they will be seeing patient in consult and likely surgery  Discharge Diagnoses:  Principal Problem:   Closed bicondylar fracture of right tibial plateau Active Problems:   Polysubstance abuse (HCC)  Closed right bicondylar tibial plateau fracture s/p ORIF on 6/30,  NWB RLE. Hinged knee brace locked in extension at night. Unlocked during the day.  Pain control and physical therapy evaluation recommending home health PT with wheelchair.    Polysubstance abuse: - social work consulted for resources.   Tobacco abuse:  On nicotine patch.    Hepatitis C:  Outpatient follow up with pcp.   Discharge Instructions  Discharge Instructions    Diet - low sodium heart healthy   Complete by:  As directed    Discharge instructions   Complete by:  As directed    PLEASE follow up with Orthopedics as recommended.     Allergies as of 11/16/2018    No Known Allergies     Medication List    STOP taking these medications   loratadine 10 MG tablet Commonly known as:  Claritin   potassium chloride SA 20 MEQ tablet Commonly known as:  K-DUR   traMADol 50 MG tablet Commonly known as:  ULTRAM     TAKE these medications   esomeprazole 20 MG capsule Commonly known as:  NEXIUM Take 1 capsule (20 mg total) by mouth daily at 12 noon. For acid reflux   feeding supplement (ENSURE ENLIVE) Liqd Take 237 mLs by mouth 2 (two) times daily between meals.   folic acid 1 MG tablet Commonly known as:  FOLVITE Take 1 tablet (1 mg total) by mouth daily. Start taking on:  November 17, 2018   gabapentin 300 MG capsule Commonly known as:  NEURONTIN Take 300 mg by mouth 3 (three) times daily.   LORazepam 0.5 MG tablet Commonly known as:  ATIVAN Take 0.5 mg by mouth 2 (two) times daily.   methocarbamol 500 MG tablet Commonly known as:  ROBAXIN Take 1 tablet (500 mg total) by mouth every 6 (six) hours as needed for muscle spasms.   multivitamin with minerals Tabs tablet Take 1 tablet by mouth daily. For low vitamin   nicotine 21 mg/24hr patch Commonly known as:  NICODERM CQ - dosed in mg/24 hours Place 1 patch (21 mg total) onto the skin daily. Start taking on:  November 17, 2018   oxyCODONE 5 MG immediate release tablet Commonly known as:  Oxy IR/ROXICODONE Take 5 mg by mouth 4 (four) times daily. What  changed:  Another medication with the same name was removed. Continue taking this medication, and follow the directions you see here.   thiamine 100 MG tablet Take 1 tablet (100 mg total) by mouth daily. Start taking on:  November 17, 2018   traZODone 150 MG tablet Commonly known as:  DESYREL Take 150 mg by mouth at bedtime.      Follow-up Information    Haddix, Gillie Manners, MD. Schedule an appointment as soon as possible for a visit in 2 week(s).   Specialty:  Orthopedic Surgery Why:  repeat x-rays and suture removal Contact  information: [address] [place] [address] [phone]        Thayer Headings, MD. Schedule an appointment as soon as possible for a visit in 1 week(s).   Specialty:  Internal Medicine Contact information: [address] [place] [address] [phone]          No Known Allergies  Consultations:  Orthopedics.    Procedures/Studies: Dg Ribs Unilateral W/chest Right  Result Date: 11/14/2018 CLINICAL DATA:  Pain status post fall EXAM: RIGHT RIBS AND CHEST - 3+ VIEW COMPARISON:  11/13/2016. FINDINGS: There is a stable old sclerotic lesion in the proximal left humerus. There are old healed bilateral rib fractures. There is an acute fracture involving the fifth and sixth ribs anterior laterally on the right. No evidence of a right-sided pneumothorax. The cardiac silhouette is mildly enlarged. There is a subacute fracture involving the seventh rib anterior laterally on the right. IMPRESSION: 1. Acute mildly displaced fractures involving the fifth and sixth ribs anterior laterally on the right. 2. Subacute appearing fracture involving the seventh rib on the right. 3. No pneumothorax. 4. Multiple old healed bilateral rib fractures are again noted. Electronically Signed   By: Katherine Mantle M.D.   On: 11/14/2018 01:53   Dg Tibia/fibula Right  Result Date: 11/13/2018 CLINICAL DATA:  Right lower leg pain after fall from ladder. EXAM: RIGHT TIBIA AND FIBULA - 2 VIEW COMPARISON:  None. FINDINGS: Severely comminuted and displaced fracture is seen involving the lateral tibial plateau and proximal tibial shaft with intra-articular extension. Severely comminuted and displaced fracture is seen involving the proximal right fibula as well. IMPRESSION: Severely comminuted and displaced fractures are seen involving the right lateral tibial plateau and proximal tibial shaft, as well as the proximal right fibula. Electronically Signed   By: Lupita Raider M.D.   On:  11/13/2018 19:35   Ct Knee Right Wo Contrast  Result Date: 11/14/2018 CLINICAL DATA:  Fracture. EXAM: CT OF THE right KNEE WITHOUT CONTRAST TECHNIQUE: Multidetector CT imaging of the right knee was performed according to the standard protocol. Multiplanar CT image reconstructions were also generated. COMPARISON:  X-ray from 11/13/2018 FINDINGS: Bones/Joint/Cartilage Again identified is a highly comminuted impacted fracture involving the proximal tibia. The fracture planes extend through the lateral and medial tibial plateaus. There is a highly comminuted fracture involving the proximal fibula. There is a large joint effusion with evidence of lipohemarthrosis. Ligaments Suboptimally assessed by CT. Muscles and Tendons There is extensive surrounding soft tissue swelling. A large joint effusion with lipohemarthrosis is noted. Soft tissues Surrounding soft tissue swelling is noted. IMPRESSION: Again noted is a highly comminuted impacted fracture of proximal tibia with fracture planes extending through the articular surfaces of the lateral and medial tibial plateaus. Highly comminuted fracture of the proximal fibula. Large joint effusion with lipohemarthrosis. Electronically Signed   By: Katherine Mantle M.D.   On: 11/14/2018  01:51   Dg Knee Complete 4 Views Right  Result Date: 11/14/2018 CLINICAL DATA:  ORIF tibial fracture. EXAM: RIGHT KNEE - COMPLETE 4+ VIEW; DG C-ARM 61-120 MIN COMPARISON:  Multiple films over the last 2 days. FINDINGS: Multiple C-arm images show progressive ORIF of proximal tibial fracture with lateral plate and screws with restoration a nearly normal tibial plateau. No hardware in the proximal fibula which appears unchanged. IMPRESSION: ORIF of proximal tibial fracture. Electronically Signed   By: Paulina Fusi M.D.   On: 11/14/2018 11:05   Dg Knee Complete 4 Views Right  Result Date: 11/13/2018 CLINICAL DATA:  Right leg pain after fall from ladder. EXAM: RIGHT KNEE - COMPLETE 4+ VIEW  COMPARISON:  None. FINDINGS: Severely comminuted and displaced fracture is seen involving the lateral tibial plateau and proximal tibial shaft. Also noted is severely comminuted and displaced fracture involving the proximal fibula. Visualized portion of the femur and patella are unremarkable. IMPRESSION: Severely comminuted and displaced fractures are seen involving the lateral tibial plateau and proximal tibial shaft with intra-articular extension, as well as the proximal fibula. Electronically Signed   By: Lupita Raider M.D.   On: 11/13/2018 19:33   Dg Knee Right Port  Result Date: 11/14/2018 CLINICAL DATA:  Status post fixation of a right tibial plateau fracture which the patient suffered 11/13/2018 due to a fall from a ladder. Initial encounter. EXAM: PORTABLE RIGHT KNEE - 1-2 VIEW COMPARISON:  Plain films right knee 11/13/2018 and CT scan 11/14/2018. FINDINGS: Comminuted tibial plateau fracture has been fixed with lateral plate and screws and a single interfragmentary screw in the medial plateau from an anterior approach. Hardware is intact. Position and alignment are markedly improved. No acute abnormality. Comminuted fibular head and neck fracture noted as seen on the prior exams. Vac drain is in place. IMPRESSION: Status post ORIF of a comminuted proximal right tibial fracture. Position alignment are markedly improved. No acute abnormality. Electronically Signed   By: Drusilla Kanner M.D.   On: 11/14/2018 13:51   Dg C-arm 1-60 Min  Result Date: 11/14/2018 CLINICAL DATA:  ORIF tibial fracture. EXAM: RIGHT KNEE - COMPLETE 4+ VIEW; DG C-ARM 61-120 MIN COMPARISON:  Multiple films over the last 2 days. FINDINGS: Multiple C-arm images show progressive ORIF of proximal tibial fracture with lateral plate and screws with restoration a nearly normal tibial plateau. No hardware in the proximal fibula which appears unchanged. IMPRESSION: ORIF of proximal tibial fracture. Electronically Signed   By: Paulina Fusi  M.D.   On: 11/14/2018 11:05       Subjective: No new complaints.   Discharge Exam: Vitals:   11/15/18 2315 11/16/18 0344  BP: (!) 167/76 (!) 150/88  Pulse: 71 68  Resp: 11 (!) 26  Temp: 98.4 F (36.9 C) 97.6 F (36.4 C)  SpO2: 99% 97%   Vitals:   11/15/18 2000 11/15/18 2215 11/15/18 2315 11/16/18 0344  BP: (!) 158/76  (!) 167/76 (!) 150/88  Pulse: 85 71 71 68  Resp:   11 (!) 26  Temp:   98.4 F (36.9 C) 97.6 F (36.4 C)  TempSrc:   Oral Oral  SpO2:   99% 97%    General: Pt is alert, awake, not in acute distress Cardiovascular: RRR, S1/S2 +, no rubs, no gallops Respiratory: CTA bilaterally, no wheezing, no rhonchi Abdominal: Soft, NT, ND, bowel sounds + Extremities: no edema, no cyanosis    The results of significant diagnostics from this hospitalization (including imaging, microbiology, ancillary and laboratory)  are listed below for reference.     Microbiology: Recent Results (from the past 240 hour(s))  SARS Coronavirus 2 (CEPHEID - Performed in [facility] hospital lab), Hosp Order     Status: None   Collection Time: 11/14/18  2:21 AM  Result Value Ref Range Status   SARS Coronavirus 2 NEGATIVE NEGATIVE Final    Comment: (NOTE) If result is NEGATIVE SARS-CoV-2 target nucleic acids are NOT DETECTED. The SARS-CoV-2 RNA is generally detectable in upper and lower  respiratory specimens during the acute phase of infection. The lowest  concentration of SARS-CoV-2 viral copies this assay can detect is 250  copies / mL. A negative result does not preclude SARS-CoV-2 infection  and should not be used as the sole basis for treatment or other  patient management decisions.  A negative result may occur with  improper specimen collection / handling, submission of specimen other  than nasopharyngeal swab, presence of viral mutation(s) within the  areas targeted by this assay, and inadequate number of viral copies  (<250 copies / mL). A negative result must be combined  with clinical  observations, patient history, and epidemiological information. If result is POSITIVE SARS-CoV-2 target nucleic acids are DETECTED. The SARS-CoV-2 RNA is generally detectable in upper and lower  respiratory specimens dur ing the acute phase of infection.  Positive  results are indicative of active infection with SARS-CoV-2.  Clinical  correlation with patient history and other diagnostic information is  necessary to determine patient infection status.  Positive results do  not rule out bacterial infection or co-infection with other viruses. If result is PRESUMPTIVE POSTIVE SARS-CoV-2 nucleic acids MAY BE PRESENT.   A presumptive positive result was obtained on the submitted specimen  and confirmed on repeat testing.  While 2019 novel coronavirus  (SARS-CoV-2) nucleic acids may be present in the submitted sample  additional confirmatory testing may be necessary for epidemiological  and / or clinical management purposes  to differentiate between  SARS-CoV-2 and other Sarbecovirus currently known to infect humans.  If clinically indicated additional testing with an alternate test  methodology [phone]) is advised. The SARS-CoV-2 RNA is generally  detectable in upper and lower respiratory sp ecimens during the acute  phase of infection. The expected result is Negative. Fact Sheet for Patients:  BoilerBrush.com.cy Fact Sheet for Healthcare Providers: https://pope.com/ This test is not yet approved or cleared by the [place] FDA and has been authorized for detection and/or diagnosis of SARS-CoV-2 by FDA under an Emergency Use Authorization (EUA).  This EUA will remain in effect (meaning this test can be used) for the duration of the COVID-19 declaration under Section 564(b)(1) of the Act, 21 U.S.C. section 360bbb-3(b)(1), unless the authorization is terminated or revoked sooner. Performed at [facility], [address], [place], [place] [address]      Labs: BNP (last 3 results) No results for input(s): BNP in the last 8760 hours. Basic Metabolic Panel: Recent Labs  Lab 11/14/18 0217 11/16/18 0310  NA 136 136  K 3.9 3.8  CL 101 103  CO2 24 21*  GLUCOSE 111* 107*  BUN 10 12  CREATININE 0.68 0.58*  CALCIUM 9.1 8.6*   Liver Function Tests: No results for input(s): AST, ALT, ALKPHOS, BILITOT, PROT, ALBUMIN in the last 168 hours. No results for input(s): LIPASE, AMYLASE in the last 168 hours. No results for input(s): AMMONIA in the last 168 hours. CBC: Recent Labs  Lab 11/14/18 0217 11/15/18 0149 11/16/18 0310  WBC 13.5* 14.2* 12.3*  NEUTROABS 8.9*  --   --   HGB 13.1 11.9* 12.4*  HCT 39.6 35.8* 37.2*  MCV 83.9 83.3 83.4  PLT 259 209 230   Cardiac Enzymes: No results for input(s): CKTOTAL, CKMB, CKMBINDEX, TROPONINI in the last 168 hours. BNP: Invalid input(s): POCBNP CBG: Recent Labs  Lab 11/15/18 1321 11/15/18 1603 11/15/18 2008 11/15/18 2312 11/16/18 0339  GLUCAP 120* 107* 96 98 99   D-Dimer No results for input(s): DDIMER in the last 72 hours. Hgb A1c No results for input(s): HGBA1C in the last 72 hours. Lipid Profile No results for input(s): CHOL, HDL, LDLCALC, TRIG, CHOLHDL, LDLDIRECT in the last 72 hours. Thyroid function studies No results for input(s): TSH, T4TOTAL, T3FREE, THYROIDAB in the last 72 hours.  Invalid input(s): FREET3 Anemia work up No results for input(s): VITAMINB12, FOLATE, FERRITIN, TIBC, IRON, RETICCTPCT in the last 72 hours. Urinalysis    Component Value Date/Time   COLORURINE YELLOW 08/08/2016 1250   APPEARANCEUR CLEAR 08/08/2016 1250   LABSPEC 1.011 08/08/2016 1250   PHURINE 7.0 08/08/2016 1250   GLUCOSEU 50 (A) 08/08/2016 1250   HGBUR NEGATIVE 08/08/2016 1250   BILIRUBINUR NEGATIVE 08/08/2016 1250   KETONESUR NEGATIVE 08/08/2016 1250   PROTEINUR NEGATIVE 08/08/2016 1250   NITRITE NEGATIVE 08/08/2016 1250   LEUKOCYTESUR  NEGATIVE 08/08/2016 1250   Sepsis Labs Invalid input(s): PROCALCITONIN,  WBC,  LACTICIDVEN Microbiology Recent Results (from the past 240 hour(s))  SARS Coronavirus 2 (CEPHEID - Performed in [facility] hospital lab), Hosp Order     Status: None   Collection Time: 11/14/18  2:21 AM  Result Value Ref Range Status   SARS Coronavirus 2 NEGATIVE NEGATIVE Final    Comment: (NOTE) If result is NEGATIVE SARS-CoV-2 target nucleic acids are NOT DETECTED. The SARS-CoV-2 RNA is generally detectable in upper and lower  respiratory specimens during the acute phase of infection. The lowest  concentration of SARS-CoV-2 viral copies this assay can detect is 250  copies / mL. A negative result does not preclude SARS-CoV-2 infection  and should not be used as the sole basis for treatment or other  patient management decisions.  A negative result may occur with  improper specimen collection / handling, submission of specimen other  than nasopharyngeal swab, presence of viral mutation(s) within the  areas targeted by this assay, and inadequate number of viral copies  (<250 copies / mL). A negative result must be combined with clinical  observations, patient history, and epidemiological information. If result is POSITIVE SARS-CoV-2 target nucleic acids are DETECTED. The SARS-CoV-2 RNA is generally detectable in upper and lower  respiratory specimens dur ing the acute phase of infection.  Positive  results are indicative of active infection with SARS-CoV-2.  Clinical  correlation with patient history and other diagnostic information is  necessary to determine patient infection status.  Positive results do  not rule out bacterial infection or co-infection with other viruses. If result is PRESUMPTIVE POSTIVE SARS-CoV-2 nucleic acids MAY BE PRESENT.   A presumptive positive result was obtained on the submitted specimen  and confirmed on repeat testing.  While 2019 novel coronavirus  (SARS-CoV-2) nucleic  acids may be present in the submitted sample  additional confirmatory testing may be necessary for epidemiological  and / or clinical management purposes  to differentiate between  SARS-CoV-2 and other Sarbecovirus currently known to infect humans.  If clinically indicated additional testing with an alternate test  methodology [phone]) is advised. The SARS-CoV-2 RNA is  generally  detectable in upper and lower respiratory sp ecimens during the acute  phase of infection. The expected result is Negative. Fact Sheet for Patients:  BoilerBrush.com.cyhttps://www.fda.gov/media/136312/download Fact Sheet for Healthcare Providers: https://pope.com/https://www.fda.gov/media/136313/download This test is not yet approved or cleared by the [place] FDA and has been authorized for detection and/or diagnosis of SARS-CoV-2 by FDA under an Emergency Use Authorization (EUA).  This EUA will remain in effect (meaning this test can be used) for the duration of the COVID-19 declaration under Section 564(b)(1) of the Act, 21 U.S.C. section 360bbb-3(b)(1), unless the authorization is terminated or revoked sooner. Performed at [facility], [address] [place], [place] [address]      Time coordinating discharge: 36 minutes  SIGNED:   Kathlen ModyVijaya Armend Hochstatter, MD  [facility] 11/16/2018, 8:31 AM Pager   If 7PM-7AM, please contact night-coverage [email or web address] Password [email or web address]

## 2018-11-16 NOTE — Progress Notes (Signed)
Physical Therapy Treatment Patient Details Name: Bryan Powell MRN: [id number] DOB: 10/01/1950 Today's Date: 11/16/2018    History of Present Illness Pt is a 68 y.o. M with significant PMH of Hepatitis C, polysubstance abuse including IV heroin, alcohol and tobacco, who presents after a fall trying to clean his gutters. Admitted with right bicondylar tibial plateau fx s/p ORIF 6/3 and right tibial shaft fx s/p ORIF 6/3.    PT Comments    Pt reporting improvement in pain and mobility. Very hyper focused on going home today. Progressing to hopping 30 feet with min assist and walker. However, pt still achieving very little left foot clearance with unsafe technique. Continue to recommend wheelchair for all mobility until change in weightbearing status. Pt has 2-3 small steps to enter from side door. Recommended "wheelchair bump up," technique with +2 assist; handout provided with instruction. Pt unable to progress to stair training at this time with inefficient clearing of left foot. Reviewed supine HEP for strengthening and increased range of motion, pt achieving ~30 degrees knee flexion currently.    Follow Up Recommendations  Home health PT;Supervision for mobility/OOB     Equipment Recommendations  Wheelchair (measurements PT);Other (comment);Wheelchair cushion (measurements PT)(elevating legrests)    Recommendations for Other Services OT consult     Precautions / Restrictions Precautions Precautions: Fall;Other (comment) Precaution Comments: wound vac Required Braces or Orthoses: Other Brace Other Brace: Right hinged knee brace. Unlocked during day, locked in extension at night Restrictions Weight Bearing Restrictions: Yes RLE Weight Bearing: Non weight bearing    Mobility  Bed Mobility Overal bed mobility: Needs Assistance Bed Mobility: Supine to Sit     Supine to sit: Min guard     General bed mobility comments: Protection of RLE when bringing off of bed. No physical  assist required  Transfers Overall transfer level: Needs assistance Equipment used: Rolling walker (2 wheeled) Transfers: Sit to/from [employer] Sit to Stand: Min assist         General transfer comment: MinA to stabilize to stand from edge of bed. Cues for hand and foot placement, pt preferring to pull up from walker   Ambulation/Gait Ambulation/Gait assistance: Min assist Gait Distance (Feet): 30 Feet Assistive device: Rolling walker (2 wheeled) Gait Pattern/deviations: Step-to pattern     General Gait Details: Hop to pattern, min assist for stability. Max cues for sequencing, increased left foot clearance, segmental turning, controlling momentum, walker proximity. Pt still with lack of appropriate push off through arms   Stairs             Wheelchair Mobility    Modified Rankin (Stroke Patients Only)       Balance Overall balance assessment: Needs assistance   Sitting balance-Leahy Scale: Good     Standing balance support: Bilateral upper extremity supported Standing balance-Leahy Scale: Poor                              Cognition Arousal/Alertness: Awake/alert Behavior During Therapy: Flat affect Overall Cognitive Status: Impaired/Different from baseline Area of Impairment: Problem solving;Safety/judgement;Memory                     Memory: Decreased short-term memory   Safety/Judgement: Decreased awareness of deficits   Problem Solving: Decreased initiation;Difficulty sequencing;Slow processing;Requires verbal cues General Comments: Pt hyperfocused on going home, asking multiple times throughout session. Decreased short term memory recall noted in regards to education  Exercises General Exercises - Lower Extremity Ankle Circles/Pumps: 20 reps;Right;Supine Quad Sets: 10 reps;Right;Supine Heel Slides: 5 reps;Right;Supine Hip ABduction/ADduction: 5 reps;Right;Supine Straight Leg Raises: 5  reps;AAROM;Supine    General Comments        Pertinent Vitals/Pain Pain Assessment: Faces Faces Pain Scale: Hurts a little bit Pain Location: R anterior knee Pain Intervention(s): Monitored during session;Premedicated before session    Home Living                      Prior Function            PT Goals (current goals can now be found in the care plan section) Acute Rehab PT Goals Patient Stated Goal: "go home tomorrow." PT Goal Formulation: With patient Time For Goal Achievement: 11/29/18 Potential to Achieve Goals: Good Progress towards PT goals: Progressing toward goals    Frequency    Min 5X/week      PT Plan Current plan remains appropriate    Co-evaluation              AM-PAC PT "6 Clicks" Mobility   Outcome Measure  Help needed turning from your back to your side while in a flat bed without using bedrails?: None Help needed moving from lying on your back to sitting on the side of a flat bed without using bedrails?: A Little Help needed moving to and from a bed to a chair (including a wheelchair)?: A Little Help needed standing up from a chair using your arms (e.g., wheelchair or bedside chair)?: A Little Help needed to walk in hospital room?: A Little Help needed climbing 3-5 steps with a railing? : Total 6 Click Score: 17    End of Session Equipment Utilized During Treatment: Gait belt Activity Tolerance: Patient tolerated treatment well Patient left: in chair;with call bell/phone within reach Nurse Communication: Mobility status PT Visit Diagnosis: Other abnormalities of gait and mobility (R26.89);Unsteadiness on feet (R26.81);Difficulty in walking, not elsewhere classified (R26.2);Pain Pain - Right/Left: Right Pain - part of body: Knee     Time: 0843-0900 PT Time Calculation (min) (ACUTE ONLY): 17 min  Charges:  $Gait Training: 8-22 mins                     Laurina Bustlearoline Markan Cazarez, PT, DPT Acute Rehabilitation Services Pager  [phone] Office [phone]    Vanetta MuldersCarloine H Traeh Milroy 11/16/2018, 9:52 AM

## 2018-11-16 NOTE — TOC Transition Note (Signed)
Transition of Care [facility]) - CM/SW Discharge Note   Patient Details  Name: Bryan Powell MRN: [id number] Date of Birth: 05-10-1951  [facility] CM/SW Contact:  Bess Kinds, RN Phone Number: [phone] 11/16/2018, 3:28 PM   Clinical Narrative:    Patient transition home today. Order received for patient to have wheelchair and 3N1. Received call from Zach at [employer] that patient discharged with wheelchair but had not received 3N1. [employer] will deliver 3N1 to patient home. LVM on [phone] for patient to return call about [facility] PT/RN needs. Spoke with bedside RN who stated that patient was ready to leave saying he wanted out of the hospital refusing 3N1 and HH services.        Patient Goals and CMS Choice        Discharge Placement                       Discharge Plan and Services                                     Social Determinants of Health (SDOH) Interventions     Readmission Risk Interventions No flowsheet data found.

## 2018-11-22 ENCOUNTER — Encounter: Payer: Self-pay | Admitting: Student

## 2018-11-22 DIAGNOSIS — S82201A Unspecified fracture of shaft of right tibia, initial encounter for closed fracture: Secondary | ICD-10-CM | POA: Insufficient documentation

## 2018-11-22 DIAGNOSIS — S82111A Displaced fracture of right tibial spine, initial encounter for closed fracture: Secondary | ICD-10-CM | POA: Insufficient documentation

## 2019-02-28 ENCOUNTER — Ambulatory Visit: Payer: Medicare HMO | Attending: Student | Admitting: Physical Therapy

## 2019-02-28 ENCOUNTER — Other Ambulatory Visit: Payer: Self-pay

## 2019-02-28 ENCOUNTER — Encounter: Payer: Self-pay | Admitting: Physical Therapy

## 2019-02-28 DIAGNOSIS — R262 Difficulty in walking, not elsewhere classified: Secondary | ICD-10-CM | POA: Diagnosis present

## 2019-02-28 DIAGNOSIS — M79661 Pain in right lower leg: Secondary | ICD-10-CM | POA: Insufficient documentation

## 2019-02-28 DIAGNOSIS — M6281 Muscle weakness (generalized): Secondary | ICD-10-CM | POA: Diagnosis present

## 2019-02-28 NOTE — Patient Instructions (Signed)
Access Code: [email or web address]  URL: [email or web address]  Date: 02/28/2019  Prepared by: Elsie Ra   Exercises  Supine Lower Trunk Rotation - 10 reps - 1-2 sets - 5 hold - 2x daily - 6x weekly  Hooklying Single Knee to Chest - 3 reps - 1 sets - 30 hold - 2x daily - 6x weekly  Supine Bridge - 10 reps - 1-2 sets - 5 hold - 2x daily - 6x weekly  Sit to Stand with Arm Reach Toward Target - 5 reps - 1-2 sets - 2x daily - 6x weekly  Standing Tandem Balance with Counter Support - 3 reps - 1 sets - 30 hold - 2x daily - 6x weekly  Side Stepping with Unilateral Counter Support - 10 reps - 3 sets - 2x daily - 6x weekly

## 2019-02-28 NOTE — Therapy (Signed)
[facility] [address] [place], [place], [address] Phone: [phone]   Fax:  [phone]  Physical Therapy Evaluation  Patient Details  Name: Bryan GenreCameron Verley MRN: [id number] Date of Birth: 10/14/1950 Referring Provider (PT): Haddix, Gillie MannersKevin P, MD   Encounter Date: 02/28/2019  PT End of Session - 02/28/19 1012    Visit Number  1    Number of Visits  12    Date for PT Re-Evaluation  04/25/19    Authorization Type  [employer] MCR    PT Start Time  0927   pt 12 min late   PT Stop Time  1005    PT Time Calculation (min)  38 min    Activity Tolerance  Patient tolerated treatment well    Behavior During Therapy  [facility] for tasks assessed/performed       Past Medical History:  Diagnosis Date  . Alcohol abuse   . Anxiety   . Depression   . ED (erectile dysfunction)   . Genital herpes   . GERD (gastroesophageal reflux disease)   . Hep C w/o coma, chronic (HCC)   . Heroin abuse (HCC)   . Lower extremity venous stasis   . Poor venous access    hx. of Heroin, Alcohol abuse "extreme difficult vein access"  . Reflux     Past Surgical History:  Procedure Laterality Date  . CHOLECYSTECTOMY    . I&D EXTREMITY  06/09/2012   Procedure: IRRIGATION AND DEBRIDEMENT EXTREMITY;  Surgeon: Sharma CovertFred W Ortmann, MD;  Location: [facility] OR;  Service: Orthopedics;  Laterality: Left;  . LAPAROTOMY N/A 08/01/2016   Procedure: EXPLORATORY LAPAROTOMY;  Surgeon: Berna Buehelsea A Connor, MD;  Location: [facility] OR;  Service: General;  Laterality: N/A;  . ORBITAL FRACTURE SURGERY    . ORIF TIBIA PLATEAU Right 11/14/2018   Procedure: OPEN REDUCTION INTERNAL FIXATION (ORIF) TIBIAL PLATEAU;  Surgeon: Roby LoftsHaddix, Kevin P, MD;  Location: [facility] OR;  Service: Orthopedics;  Laterality: Right;  . SPLENECTOMY, TOTAL N/A 08/01/2016   Procedure: SPLENECTOMY;  Surgeon: Violeta GelinasBurke Thompson, MD;  [facility];  Service: General;  Laterality: N/A;  . TONSILLECTOMY      There were no vitals filed for  this visit.   Subjective Assessment - 02/28/19 0949    Subjective  He relays biconylar fracture right tibial plateau, with ORIF tibia plateau 11/14/18, injury from fall while cleaning his gutters, he is WBAT Rt LE. He had HHPT that finshed last week. He is still using RW, relays his leg got infected 2 weeks ago due to poor circulation and cellulits. He relays difficulty with mobility, gait, standing activity, balance, stairs (now sleeping on couch so he doesnt have to go upstairs to his bedroom and this is causing back pain),    Pertinent History  ORIF tibia plateau 11/14/18,PMH: ETOH and heroin abuse, GERD,hep C, LE venous statsis    Limitations  Sitting;Standing;House hold activities;Walking    How long can you stand comfortably?  5    How long can you walk comfortably?  2 min    Currently in Pain?  Yes    Pain Score  8     Pain Location  Tibia    Pain Orientation  Right    Pain Descriptors / Indicators  Aching    Pain Type  Surgical pain;Chronic pain         [facility] PT Assessment - 02/28/19 0001      Assessment   Medical Diagnosis  biconylar fracture of right tibial plateau,  with ORIF tibia plateau 11/14/18    Referring Provider (PT)  Haddix, Gillie MannersKevin P, MD    Onset Date/Surgical Date  11/14/18    Next MD Visit  03/05/19    Prior Therapy  HHPT      Precautions   Precautions  None      Restrictions   Weight Bearing Restrictions  Yes    RLE Weight Bearing  Weight bearing as tolerated      Balance Screen   Has the patient fallen in the past 6 months  Yes    How many times?  1   fell off ladder   Has the patient had a decrease in activity level because of a fear of falling?   Yes    Is the patient reluctant to leave their home because of a fear of falling?   No      Home Environment   Living Environment  Private residence    Additional Comments  his bedroom is on 2nd floor and too painful to go up      Prior Function   Level of Independence  Independent      Cognition   Overall  Cognitive Status  Within Functional Limits for tasks assessed      Observation/Other Assessments   Observations  small stage 2-3 venous ulcer Rt lower leg, he saw MD for this    Focus on Therapeutic Outcomes (FOTO)   not set up for patient so not done today but he does relay he feels he is 75% of his normal functional abilites      Sensation   Light Touch  Impaired by gross assessment      Coordination   Gross Motor Movements are Fluid and Coordinated  Yes      ROM / Strength   AROM / PROM / Strength  AROM;Strength      AROM   Overall AROM Comments  Rt knee ROM limited about 10-15%      Strength   Overall Strength Comments  Rt hip overall 4/5 MMT, Rt knee overall 4+/5 MMT grossly tested in sitting      Transfers   Comments  Mod I for transfers using RW, increased time needed      Ambulation/Gait   Ambulation/Gait  Yes    Ambulation/Gait Assistance  6: Modified independent (Device/Increase time);5: Supervision    Ambulation Distance (Feet)  75 Feet    Assistive device  Rolling walker    Gait Comments  decreased stance time on Rt leg and decreased push off due to pain      Balance   Balance Assessed  Yes      Standardized Balance Assessment   Standardized Balance Assessment  Berg Balance Test      Berg Balance Test   Sit to Stand  Able to stand  independently using hands    Standing Unsupported  Able to stand 2 minutes with supervision    Sitting with Back Unsupported but Feet Supported on Floor or Stool  Able to sit safely and securely 2 minutes    Stand to Sit  Controls descent by using hands    Transfers  Able to transfer safely, minor use of hands    Standing Unsupported with Eyes Closed  Able to stand 10 seconds with supervision    Standing Unsupported with Feet Together  Able to place feet together independently and stand for 1 minute with supervision    From Standing, Reach Forward with Outstretched Arm  Can reach forward >12 cm safely (5")    From Standing  Position, Pick up Object from Floor  Able to pick up shoe, needs supervision    From Standing Position, Turn to Look Behind Over each Shoulder  Looks behind one side only/other side shows less weight shift    Turn 360 Degrees  Able to turn 360 degrees safely but slowly    Standing Unsupported, Alternately Place Feet on Step/Stool  Able to complete >2 steps/needs minimal assist    Standing Unsupported, One Foot in Front  Able to take small step independently and hold 30 seconds    Standing on One Leg  Tries to lift leg/unable to hold 3 seconds but remains standing independently    Total Score  38                Objective measurements completed on examination: See above findings.              PT Education - 02/28/19 1011    Education Details  HEP, POC, exam findings    Person(s) Educated  Patient    Methods  Explanation;Demonstration;Verbal cues;Handout    Comprehension  Verbalized understanding;Need further instruction       PT Short Term Goals - 02/28/19 1021      PT SHORT TERM GOAL #1   Title  Pt will be I and compliant with HEP (Target for all STG 4 weeks 03/30/19)    Status  New      PT SHORT TERM GOAL #2   Title  Pt will report 25% reduction in pain overall    Baseline  8/10    Status  New        PT Long Term Goals - 02/28/19 1022      PT LONG TERM GOAL #1   Title  Pt will be able to negotiate at least one flight of stairs with manageable amount of pain so he can access his bedroom. (Target for all LTG 8 weeks 04/25/19)    Status  New      PT LONG TERM GOAL #2   Title  Pt will be able to ambulate community distances mod I at least 500 ft with LRAD    Status  New      PT LONG TERM GOAL #3   Title  Pt will improve BERG balance score to >47 to show improved balance.    Baseline  38    Status  New      PT LONG TERM GOAL #4   Title  Pt will improve Rt leg strength to overall 5-/5 MMT    Baseline  4 to 4+    Status  New             Plan  - 02/28/19 1014    Clinical Impression Statement  Pt presents with Rt leg pain and weakness from biconylar fracture of right tibial plateau, with ORIF tibia plateau 11/14/18, injury from fall while cleaning his gutters, he is WBAT Rt LE currently still using RW due to pain and poor balance. He did not use AD prior and was independent. He has small stage 2-3 venous ulcer that was steralized and dressed with sterile guaze and ace wrap. He has overall decreased activity tolerance for standing and walking, decreased balance, general Rt leg weakness and incresaed pain limiting his funciton. He will beneift from skilled PT to address his deficits.    Personal Factors and Comorbidities  Comorbidity 1;Comorbidity 3+;Comorbidity 2  Comorbidities  ORIF tibia plateau 11/14/18,PMH: ETOH and heroin abuse, GERD,hep C, LE venous statsis with small stage 2-3 ulcer    Examination-Activity Limitations  Locomotion Level;Transfers;Bend;Carry;Squat;Dressing;Stairs;Lift;Stand    Examination-Participation Restrictions  Meal Prep;Cleaning;Community Activity;Driving;Laundry    Stability/Clinical Decision Making  Evolving/Moderate complexity    Clinical Decision Making  Moderate    Rehab Potential  Good    PT Frequency  Other (comment)   1-2   PT Duration  8 weeks    PT Treatment/Interventions  ADLs/Self Care Home Management;Cryotherapy;DME Instruction;Gait training;Stair training;Therapeutic activities;Therapeutic exercise;Balance training;Neuromuscular re-education;Manual techniques;Passive range of motion;Taping    PT Next Visit Plan  check his wound as needed, review HEP, needs dynamic balance and Rt leg strength and Rt knee ROM as tolerated    PT Home Exercise Plan  LTR, bridges, SKTC, sit to stands no hands, tandem balance, sidestepping    Consulted and Agree with Plan of Care  Patient       Patient will benefit from skilled therapeutic intervention in order to improve the following deficits and impairments:  Abnormal  gait, Decreased activity tolerance, Decreased balance, Decreased endurance, Decreased mobility, Decreased range of motion, Decreased strength, Difficulty walking, Pain, Decreased skin integrity  Visit Diagnosis: Pain in right lower leg  Muscle weakness (generalized)  Difficulty in walking, not elsewhere classified     Problem List Patient Active Problem List   Diagnosis Date Noted  . Fracture of tibial shaft, right, closed 11/22/2018  . Closed displaced fracture of right tibial spine 11/22/2018  . Closed bicondylar fracture of right tibial plateau 11/14/2018  . Polysubstance abuse ([place]) 11/14/2018  . Closed fracture of upper end of right fibula   . Poor venous access 03/07/2018  . 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 Bilateral lower leg cellulitis   . Venous insufficiency of both lower extremities   . Bilateral lower extremity edema   . 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 Severe opioid use disorder ([place]) 10/05/2013  . Abscess of left hip 10/04/2012  . Finger infection 06/09/2012  . Recurrent cellulitis of lower leg 10/12/2011  . Lower extremity venous stasis   . Reflux   . ALLERGIC RHINITIS 11/02/2007  . ERECTILE DYSFUNCTION 11/02/2007  . GENITAL HERPES 05/23/2007  . Chronic hepatitis C virus infection ([place]) 05/23/2007  . ANXIETY 05/23/2007  . HEROIN ABUSE 05/23/2007  . GERD 05/23/2007  . LOW BACK PAIN 05/23/2007    Silvestre Mesi 02/28/2019, 10:34 AM  [facility] [address] [place], [place], [address] Phone: [phone]   Fax:  [phone]  Name: Amar Sippel MRN: [id number] Date of Birth: [birth year]

## 2019-03-05 ENCOUNTER — Ambulatory Visit: Payer: Medicare HMO | Admitting: Physical Therapy

## 2019-03-12 ENCOUNTER — Ambulatory Visit: Payer: Medicare HMO | Admitting: Physical Therapy

## 2019-03-15 ENCOUNTER — Other Ambulatory Visit: Payer: Self-pay

## 2019-03-15 ENCOUNTER — Ambulatory Visit: Payer: Medicare HMO | Attending: Student | Admitting: Physical Therapy

## 2019-03-15 ENCOUNTER — Encounter: Payer: Self-pay | Admitting: Physical Therapy

## 2019-03-15 DIAGNOSIS — R600 Localized edema: Secondary | ICD-10-CM | POA: Diagnosis present

## 2019-03-15 DIAGNOSIS — M79661 Pain in right lower leg: Secondary | ICD-10-CM | POA: Diagnosis not present

## 2019-03-15 DIAGNOSIS — R262 Difficulty in walking, not elsewhere classified: Secondary | ICD-10-CM | POA: Insufficient documentation

## 2019-03-15 DIAGNOSIS — M25571 Pain in right ankle and joints of right foot: Secondary | ICD-10-CM | POA: Diagnosis present

## 2019-03-15 DIAGNOSIS — M6281 Muscle weakness (generalized): Secondary | ICD-10-CM | POA: Diagnosis present

## 2019-03-15 NOTE — Therapy (Signed)
[facility] [address] [place], [place], [address] Phone: [phone]   Fax:  [phone]  Physical Therapy Treatment  Patient Details  Name: Bryan Powell MRN: [id number] Date of Birth: [birth year] Referring Provider (PT): Haddix, Gillie Manners, MD   Encounter Date: 03/15/2019  PT End of Session - 03/15/19 1111    Visit Number  2    Number of Visits  12    Date for PT Re-Evaluation  04/25/19    Authorization Type  [employer] MCR    PT Start Time  1106    PT Stop Time  1144    PT Time Calculation (min)  38 min       Past Medical History:  Diagnosis Date  . Alcohol abuse   . Anxiety   . Depression   . ED (erectile dysfunction)   . Genital herpes   . GERD (gastroesophageal reflux disease)   . Hep C w/o coma, chronic (HCC)   . Heroin abuse (HCC)   . Lower extremity venous stasis   . Poor venous access    hx. of Heroin, Alcohol abuse "extreme difficult vein access"  . Reflux     Past Surgical History:  Procedure Laterality Date  . CHOLECYSTECTOMY    . I&D EXTREMITY  06/09/2012   Procedure: IRRIGATION AND DEBRIDEMENT EXTREMITY;  Surgeon: Sharma Covert, MD;  [facility];  Service: Orthopedics;  Laterality: Left;  . LAPAROTOMY N/A 08/01/2016   Procedure: EXPLORATORY LAPAROTOMY;  Surgeon: Berna Bue, MD;  [facility] OR;  Service: General;  Laterality: N/A;  . ORBITAL FRACTURE SURGERY    . ORIF TIBIA PLATEAU Right 11/14/2018   Procedure: OPEN REDUCTION INTERNAL FIXATION (ORIF) TIBIAL PLATEAU;  Surgeon: Roby Lofts, MD;  [facility];  Service: Orthopedics;  Laterality: Right;  . SPLENECTOMY, TOTAL N/A 08/01/2016   Procedure: SPLENECTOMY;  Surgeon: Violeta Gelinas, MD;  Location: [facility] OR;  Service: General;  Laterality: N/A;  . TONSILLECTOMY      There were no vitals filed for this visit.  Subjective Assessment - 03/15/19 1109    Subjective  The knee and leg are not the problem, it is my foot.    Currently in  Pain?  Yes    Pain Score  8     Pain Location  Leg    Pain Orientation  Right;Lateral    Pain Descriptors / Indicators  Aching    Pain Radiating Towards  right lateral ankle and heel    Aggravating Factors   getting up after being still too long, prolonged walking    Pain Relieving Factors  meds, rest         [facility] PT Assessment - 03/15/19 0001      AROM   Overall AROM Comments  Rt ankle ROM limited 50%       Strength   Overall Strength Comments  quad lag with SLR                    [facility] Adult PT Treatment/Exercise - 03/15/19 0001      Knee/Hip Exercises: Aerobic   Nustep  L3 LE only x 6 min      Knee/Hip Exercises: Standing   Other Standing Knee Exercises  tandem stance, increased pain       Knee/Hip Exercises: Seated   Sit to Sand  10 reps      Knee/Hip Exercises: Supine   Heel Slides  15 reps    Henreitta Leber  10 reps    Straight Leg Raises  10 reps    Straight Leg Raises Limitations  fatigues, cannot maintain knee extension     Other Supine Knee/Hip Exercises  ankle pumps , circles     Other Supine Knee/Hip Exercises  Seated towel scunch and ankle inversion eversion with towel              PT Education - 03/15/19 1142    Education Details  HEP    Person(s) Educated  Patient    Methods  Explanation;Handout    Comprehension  Verbalized understanding       PT Short Term Goals - 02/28/19 1021      PT SHORT TERM GOAL #1   Title  Pt will be I and compliant with HEP (Target for all STG 4 weeks 03/30/19)    Status  New      PT SHORT TERM GOAL #2   Title  Pt will report 25% reduction in pain overall    Baseline  8/10    Status  New        PT Long Term Goals - 02/28/19 1022      PT LONG TERM GOAL #1   Title  Pt will be able to negotiate at least one flight of stairs with manageable amount of pain so he can access his bedroom. (Target for all LTG 8 weeks 04/25/19)    Status  New      PT LONG TERM GOAL #2   Title  Pt will be able to ambulate  community distances mod I at least 500 ft with LRAD    Status  New      PT LONG TERM GOAL #3   Title  Pt will improve BERG balance score to >47 to show improved balance.    Baseline  38    Status  New      PT LONG TERM GOAL #4   Title  Pt will improve Rt leg strength to overall 5-/5 MMT    Baseline  4 to 4+    Status  New            Plan - 03/15/19 1144    Clinical Impression Statement  Pt arrived c/o right foot/ankle pain as hisprimary source of pain. He does demonstrate decreased ankle ROM likely due to edema and inactivity. He was given some AROM exercises for HEP and asked to elevate feet above heart to help with edema and circulation.    Comorbidities  ORIF tibia plateau 11/14/18,PMH: ETOH and heroin abuse, GERD,hep C, LE venous statsis with small stage 2-3 ulcer    PT Next Visit Plan  Primary PT please assess his ankle; check his wound as needed, review HEP, needs dynamic balance and Rt leg strength and Rt knee ROM as tolerated    PT Home Exercise Plan  LTR, bridges, SKTC, sit to stands no hands, tandem balance, sidestepping ankle AROM, circles       Patient will benefit from skilled therapeutic intervention in order to improve the following deficits and impairments:  Abnormal gait, Decreased activity tolerance, Decreased balance, Decreased endurance, Decreased mobility, Decreased range of motion, Decreased strength, Difficulty walking, Pain, Decreased skin integrity  Visit Diagnosis: Pain in right lower leg  Muscle weakness (generalized)  Difficulty in walking, not elsewhere classified     Problem List Patient Active Problem List   Diagnosis Date Noted  . Fracture of tibial shaft, right, closed 11/22/2018  . Closed displaced fracture of right  tibial spine 11/22/2018  . Closed bicondylar fracture of right tibial plateau 11/14/2018  . Polysubstance abuse (HCC) 11/14/2018  . Closed fracture of upper end of right fibula   . Poor venous access 03/07/2018  . 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 Bilateral lower leg cellulitis   . Venous insufficiency of both lower extremities   . Bilateral lower extremity edema   . Varicose veins of lower extremities with ulcer (HCC)   . Severe opioid use disorder (HCC) 10/05/2013  . Abscess of left hip 10/04/2012  . Finger infection 06/09/2012  . Recurrent cellulitis of lower leg 10/12/2011  . Lower extremity venous stasis   . Reflux   . ALLERGIC RHINITIS 11/02/2007  . ERECTILE DYSFUNCTION 11/02/2007  . GENITAL HERPES 05/23/2007  . Chronic hepatitis C virus infection (HCC) 05/23/2007  . ANXIETY 05/23/2007  . HEROIN ABUSE 05/23/2007  . GERD 05/23/2007  . LOW BACK PAIN 05/23/2007    Sherrie Mustacheonoho, Santa Abdelrahman McGee, PTA 03/15/2019, 11:49 AM  [facility] [address] [place], [place], [address] Phone: [phone]   Fax:  [phone]  Name: Anna GenreCameron Prindle MRN: [id number] Date of Birth: 03/14/1951

## 2019-03-15 NOTE — Patient Instructions (Signed)
ROM: Inversion / Eversion   With left leg relaxed, gently turn ankle and foot in and out. Move through full range of motion. Avoid pain. Repeat _10___ times per set. Do __2__ sets per session. Do _2___ sessions per day.  http://orth.exer.us/36   Copyright  VHI. All rights reserved.  ROM: Plantar / Dorsiflexion   With left leg relaxed, gently flex and extend ankle. Move through full range of motion. Avoid pain. Repeat _10___ times per set. Do _2___ sets per session. Do _2___ sessions per day.   Ankle Circles   Slowly rotate right foot and ankle clockwise then counterclockwise. Gradually increase range of motion. Avoid pain. Circle _10___ times each direction per set. Do 2____ sets per session. Do __2__ sessions per day.  http://orth.exer.us/30   Copyright  VHI. All rights reserved.   

## 2019-03-19 ENCOUNTER — Other Ambulatory Visit: Payer: Self-pay

## 2019-03-19 ENCOUNTER — Ambulatory Visit: Payer: Medicare HMO | Admitting: Physical Therapy

## 2019-03-19 DIAGNOSIS — M79661 Pain in right lower leg: Secondary | ICD-10-CM | POA: Diagnosis not present

## 2019-03-19 DIAGNOSIS — R262 Difficulty in walking, not elsewhere classified: Secondary | ICD-10-CM

## 2019-03-19 DIAGNOSIS — R600 Localized edema: Secondary | ICD-10-CM

## 2019-03-19 DIAGNOSIS — M25571 Pain in right ankle and joints of right foot: Secondary | ICD-10-CM

## 2019-03-19 DIAGNOSIS — M6281 Muscle weakness (generalized): Secondary | ICD-10-CM

## 2019-03-19 NOTE — Therapy (Signed)
[place], [place], [address] Phone: [phone]   Fax:  [phone]  Physical Therapy Treatment  Patient Details  Name: Bryan Powell MRN: [id number] Date of Birth: [birth year] Referring Provider (PT): Haddix, Thomasene Lot, MD   Encounter Date: 03/19/2019  PT End of Session - 03/19/19 1237    Visit Number  3    Number of Visits  12    Date for PT Re-Evaluation  04/25/19    Authorization Type  [employer] MCR    PT Start Time  1145   pt 15 min late4   PT Stop Time  1220    PT Time Calculation (min)  35 min    Activity Tolerance  Patient tolerated treatment well    Behavior During Therapy  [facility] for tasks assessed/performed       Past Medical History:  Diagnosis Date  . Alcohol abuse   . Anxiety   . Depression   . ED (erectile dysfunction)   . Genital herpes   . GERD (gastroesophageal reflux disease)   . Hep C w/o coma, chronic ([place])   . Heroin abuse ([place])   . Lower extremity venous stasis   . Poor venous access    hx. of Heroin, Alcohol abuse "extreme difficult vein access"  . Reflux     Past Surgical History:  Procedure Laterality Date  . CHOLECYSTECTOMY    . I&D EXTREMITY  06/09/2012   Procedure: IRRIGATION AND DEBRIDEMENT EXTREMITY;  Surgeon: Linna Hoff, MD;  Location: [place];  Service: Orthopedics;  Laterality: Left;  . LAPAROTOMY N/A 08/01/2016   Procedure: EXPLORATORY LAPAROTOMY;  Surgeon: Clovis Riley, MD;  Location: [place];  Service: General;  Laterality: N/A;  . ORBITAL FRACTURE SURGERY    . ORIF TIBIA PLATEAU Right 11/14/2018   Procedure: OPEN REDUCTION INTERNAL FIXATION (ORIF) TIBIAL PLATEAU;  Surgeon: Shona Needles, MD;  Location: [place];  Service: Orthopedics;  Laterality: Right;  . SPLENECTOMY, TOTAL N/A 08/01/2016   Procedure: SPLENECTOMY;  Surgeon: Georganna Skeans, MD;  Location: [place];  Service: General;  Laterality: N/A;  . TONSILLECTOMY      There were no vitals filed for  this visit.  Subjective Assessment - 03/19/19 1228    Subjective  Relays his Rt ankle/foot hurt the most 6/10 pain overall    Pertinent History  ORIF tibia plateau 11/14/18,PMH: ETOH and heroin abuse, GERD,hep C, LE venous statsis    Limitations  Sitting;Standing;House hold activities;Walking    How long can you stand comfortably?  5    How long can you walk comfortably?  2 min         [facility] Adult PT Treatment/Exercise - 03/19/19 0001      Ambulation/Gait   Gait Comments  gait training with SPC using it in his Lt hand with reciprocal step through pattern on his Rt, ambulated 100 ft X 2      Exercises   Exercises  Ankle      Knee/Hip Exercises: Stretches   Gastroc Stretch Limitations  3X30 seconds on slantboard      Knee/Hip Exercises: Aerobic   Nustep  L4 LE/UE x 6 min      Knee/Hip Exercises: Supine   Other Supine Knee/Hip Exercises  ankle pumps , circles     Other Supine Knee/Hip Exercises  ankle 4 way with yellow X 15 ea      Modalities   Modalities  Vasopneumatic      Vasopneumatic  Number Minutes Vasopneumatic   10 minutes    Vasopnuematic Location   Ankle    Vasopneumatic Pressure  Low    Vasopneumatic Temperature   34             PT Education - 03/19/19 1236    Education Details  rationale for vasonpnuematic, explanied some diagnosis that may be causing his ankle pain.    Person(s) Educated  Patient    Methods  Explanation    Comprehension  Verbalized understanding       PT Short Term Goals - 02/28/19 1021      PT SHORT TERM GOAL #1   Title  Pt will be I and compliant with HEP (Target for all STG 4 weeks 03/30/19)    Status  New      PT SHORT TERM GOAL #2   Title  Pt will report 25% reduction in pain overall    Baseline  8/10    Status  New        PT Long Term Goals - 02/28/19 1022      PT LONG TERM GOAL #1   Title  Pt will be able to negotiate at least one flight of stairs with manageable amount of pain so he can access his bedroom.  (Target for all LTG 8 weeks 04/25/19)    Status  New      PT LONG TERM GOAL #2   Title  Pt will be able to ambulate community distances mod I at least 500 ft with LRAD    Status  New      PT LONG TERM GOAL #3   Title  Pt will improve BERG balance score to >47 to show improved balance.    Baseline  38    Status  New      PT LONG TERM GOAL #4   Title  Pt will improve Rt leg strength to overall 5-/5 MMT    Baseline  4 to 4+    Status  New            Plan - 03/19/19 1240    Clinical Impression Statement  His ankle was assessed and screened by evaluating PT, it is difficult to tell the cause of his foot/ankle pain as he has poor vascularization likely contributing but may have some tendonitis. He has never had imaging done of his foot so if his pain does not improve with PT will refer him back to have his foot evaluated by PT. Session focused today on ankle strength and stretching along with gait training as tolerated followed by trial of vasonpneumatic to decreased pain and edema in is Rt ankle/foot    Comorbidities  ORIF tibia plateau 11/14/18,PMH: ETOH and heroin abuse, GERD,hep C, LE venous statsis with small stage 2-3 ulcer    PT Next Visit Plan  check his wound as needed,  needs dynamic balance and Rt leg strength for Rt foot/ankle and  Rt knee ROM as tolerated    PT Home Exercise Plan  LTR, bridges, SKTC, sit to stands no hands, tandem balance, sidestepping ankle AROM, circles       Patient will benefit from skilled therapeutic intervention in order to improve the following deficits and impairments:  Abnormal gait, Decreased activity tolerance, Decreased balance, Decreased endurance, Decreased mobility, Decreased range of motion, Decreased strength, Difficulty walking, Pain, Decreased skin integrity  Visit Diagnosis: Pain in right lower leg  Muscle weakness (generalized)  Difficulty in walking, not elsewhere classified  Pain in  right ankle and joints of right  foot  Localized edema     Problem List Patient Active Problem List   Diagnosis Date Noted  . Fracture of tibial shaft, right, closed 11/22/2018  . Closed displaced fracture of right tibial spine 11/22/2018  . Closed bicondylar fracture of right tibial plateau 11/14/2018  . Polysubstance abuse (HCC) 11/14/2018  . Closed fracture of upper end of right fibula   . Poor venous access 03/07/2018  . 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 Bilateral lower leg cellulitis   . Venous insufficiency of both lower extremities   . Bilateral lower extremity edema   . Varicose veins of lower extremities with ulcer (HCC)   . Severe opioid use disorder (HCC) 10/05/2013  . Abscess of left hip 10/04/2012  . Finger infection 06/09/2012  . Recurrent cellulitis of lower leg 10/12/2011  . Lower extremity venous stasis   . Reflux   . ALLERGIC RHINITIS 11/02/2007  . ERECTILE DYSFUNCTION 11/02/2007  . GENITAL HERPES 05/23/2007  . Chronic hepatitis C virus infection (HCC) 05/23/2007  . ANXIETY 05/23/2007  . HEROIN ABUSE 05/23/2007  . GERD 05/23/2007  . LOW BACK PAIN 05/23/2007    Birdie RiddleBrian R Delshawn Stech,PT,DPT 03/19/2019, 12:46 PM  [facility] [address] [place], [place], [address] Phone: [phone]   Fax:  [phone]  Name: Bryan Powell MRN: [id number] Date of Birth: 06/13/1950

## 2019-03-26 ENCOUNTER — Other Ambulatory Visit: Payer: Self-pay

## 2019-03-26 ENCOUNTER — Ambulatory Visit: Payer: Medicare HMO | Admitting: Physical Therapy

## 2019-03-26 DIAGNOSIS — M25571 Pain in right ankle and joints of right foot: Secondary | ICD-10-CM

## 2019-03-26 DIAGNOSIS — R262 Difficulty in walking, not elsewhere classified: Secondary | ICD-10-CM

## 2019-03-26 DIAGNOSIS — M79661 Pain in right lower leg: Secondary | ICD-10-CM

## 2019-03-26 DIAGNOSIS — M6281 Muscle weakness (generalized): Secondary | ICD-10-CM

## 2019-03-26 DIAGNOSIS — R600 Localized edema: Secondary | ICD-10-CM

## 2019-03-26 NOTE — Therapy (Signed)
[facility] [address] [place], [place], [address] Phone: [phone]   Fax:  [phone]  Physical Therapy Treatment  Patient Details  Name: Bryan Powell MRN: [id number] Date of Birth: 1950-07-16 Referring Provider (PT): Haddix, Gillie Manners, MD   Encounter Date: 03/26/2019  PT End of Session - 03/26/19 1300    Visit Number  4    Number of Visits  12    Date for PT Re-Evaluation  04/25/19    Authorization Type  [employer] MCR    PT Start Time  1140   pt 10 min late   PT Stop Time  1220    PT Time Calculation (min)  40 min    Activity Tolerance  Patient tolerated treatment well    Behavior During Therapy  [facility] for tasks assessed/performed       Past Medical History:  Diagnosis Date  . Alcohol abuse   . Anxiety   . Depression   . ED (erectile dysfunction)   . Genital herpes   . GERD (gastroesophageal reflux disease)   . Hep C w/o coma, chronic (HCC)   . Heroin abuse (HCC)   . Lower extremity venous stasis   . Poor venous access    hx. of Heroin, Alcohol abuse "extreme difficult vein access"  . Reflux     Past Surgical History:  Procedure Laterality Date  . CHOLECYSTECTOMY    . I&D EXTREMITY  06/09/2012   Procedure: IRRIGATION AND DEBRIDEMENT EXTREMITY;  Surgeon: Sharma Covert, MD;  [facility];  Service: Orthopedics;  Laterality: Left;  . LAPAROTOMY N/A 08/01/2016   Procedure: EXPLORATORY LAPAROTOMY;  Surgeon: Berna Bue, MD;  [facility];  Service: General;  Laterality: N/A;  . ORBITAL FRACTURE SURGERY    . ORIF TIBIA PLATEAU Right 11/14/2018   Procedure: OPEN REDUCTION INTERNAL FIXATION (ORIF) TIBIAL PLATEAU;  Surgeon: Roby Lofts, MD;  [facility];  Service: Orthopedics;  Laterality: Right;  . SPLENECTOMY, TOTAL N/A 08/01/2016   Procedure: SPLENECTOMY;  Surgeon: Violeta Gelinas, MD;  Location: [facility] OR;  Service: General;  Laterality: N/A;  . TONSILLECTOMY      There were no vitals filed for  this visit.  Subjective Assessment - 03/26/19 1300    Subjective  Relays his Rt ankle/foot hurt the most 8/10 pain overall    Pertinent History  ORIF tibia plateau 11/14/18,PMH: ETOH and heroin abuse, GERD,hep C, LE venous statsis    Limitations  Sitting;Standing;House hold activities;Walking    How long can you stand comfortably?  5    How long can you walk comfortably?  2 min                       [facility] Adult PT Treatment/Exercise - 03/26/19 0001      Knee/Hip Exercises: Stretches   Gastroc Stretch Limitations  3X30 seconds on slantboard      Knee/Hip Exercises: Aerobic   Nustep  L4 LE/UE x 6 min      Knee/Hip Exercises: Seated   Heel Slides  15 reps;Right;20 reps    Heel Slides Limitations  5 sec hold AAROM with strap      Knee/Hip Exercises: Supine   Other Supine Knee/Hip Exercises  ankle 4 way with red X 15 ea      Modalities   Modalities  Vasopneumatic      Vasopneumatic   Number Minutes Vasopneumatic   10 minutes    Vasopnuematic Location   Ankle  Vasopneumatic Pressure  Medium    Vasopneumatic Temperature   34      Manual Therapy   Manual therapy comments  ankle PROM, and manual stretching for heel cord and plantar fascia               PT Short Term Goals - 02/28/19 1021      PT SHORT TERM GOAL #1   Title  Pt will be I and compliant with HEP (Target for all STG 4 weeks 03/30/19)    Status  New      PT SHORT TERM GOAL #2   Title  Pt will report 25% reduction in pain overall    Baseline  8/10    Status  New        PT Long Term Goals - 02/28/19 1022      PT LONG TERM GOAL #1   Title  Pt will be able to negotiate at least one flight of stairs with manageable amount of pain so he can access his bedroom. (Target for all LTG 8 weeks 04/25/19)    Status  New      PT LONG TERM GOAL #2   Title  Pt will be able to ambulate community distances mod I at least 500 ft with LRAD    Status  New      PT LONG TERM GOAL #3   Title  Pt will  improve BERG balance score to >47 to show improved balance.    Baseline  38    Status  New      PT LONG TERM GOAL #4   Title  Pt will improve Rt leg strength to overall 5-/5 MMT    Baseline  4 to 4+    Status  New            Plan - 03/26/19 1301    Clinical Impression Statement  Continues to have significant foot/ankle pain but also knee pain today. Sesson focused on gentle stretching and strengthening to toleance. Continued with vasopneumatic to decrease pain and swelling. Will plan to increase to more weightbearing and balance activities when able.    Comorbidities  ORIF tibia plateau 11/14/18,PMH: ETOH and heroin abuse, GERD,hep C, LE venous statsis with small stage 2-3 ulcer    PT Next Visit Plan  check his wound as needed,  needs dynamic balance and Rt leg strength for Rt foot/ankle and  Rt knee ROM as tolerated    PT Home Exercise Plan  LTR, bridges, SKTC, sit to stands no hands, tandem balance, sidestepping ankle AROM, circles       Patient will benefit from skilled therapeutic intervention in order to improve the following deficits and impairments:  Abnormal gait, Decreased activity tolerance, Decreased balance, Decreased endurance, Decreased mobility, Decreased range of motion, Decreased strength, Difficulty walking, Pain, Decreased skin integrity  Visit Diagnosis: Pain in right lower leg  Muscle weakness (generalized)  Difficulty in walking, not elsewhere classified  Pain in right ankle and joints of right foot  Localized edema     Problem List Patient Active Problem List   Diagnosis Date Noted  . Fracture of tibial shaft, right, closed 11/22/2018  . Closed displaced fracture of right tibial spine 11/22/2018  . Closed bicondylar fracture of right tibial plateau 11/14/2018  . Polysubstance abuse (HCC) 11/14/2018  . Closed fracture of upper end of right fibula   . Poor venous access 03/07/2018  . 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 Bilateral lower leg cellulitis   .  Venous insufficiency of both lower extremities   . Bilateral lower extremity edema   . Varicose veins of lower extremities with ulcer ([place])   . Severe opioid use disorder ([place]) 10/05/2013  . Abscess of left hip 10/04/2012  . Finger infection 06/09/2012  . Recurrent cellulitis of lower leg 10/12/2011  . Lower extremity venous stasis   . Reflux   . ALLERGIC RHINITIS 11/02/2007  . ERECTILE DYSFUNCTION 11/02/2007  . GENITAL HERPES 05/23/2007  . Chronic hepatitis C virus infection ([place]) 05/23/2007  . ANXIETY 05/23/2007  . HEROIN ABUSE 05/23/2007  . GERD 05/23/2007  . LOW BACK PAIN 05/23/2007    Silvestre Mesi 03/26/2019, 1:06 PM  [facility] [address] [place], [place], [address] Phone: [phone]   Fax:  [phone]  Name: Bryan Powell MRN: [id number] Date of Birth: 02/24/1951

## 2019-04-02 ENCOUNTER — Ambulatory Visit: Payer: Medicare HMO | Admitting: Physical Therapy

## 2019-04-02 ENCOUNTER — Encounter: Payer: Self-pay | Admitting: Physical Therapy

## 2019-04-02 ENCOUNTER — Other Ambulatory Visit: Payer: Self-pay

## 2019-04-02 DIAGNOSIS — M79661 Pain in right lower leg: Secondary | ICD-10-CM | POA: Diagnosis not present

## 2019-04-02 DIAGNOSIS — R262 Difficulty in walking, not elsewhere classified: Secondary | ICD-10-CM

## 2019-04-02 DIAGNOSIS — M6281 Muscle weakness (generalized): Secondary | ICD-10-CM

## 2019-04-02 NOTE — Therapy (Addendum)
[place], [place], [address] Phone: [phone]   Fax:  [phone]  Physical Therapy Treatment  Discharge  Patient Details  Name: Bryan Powell MRN: [id number] Date of Birth: 05-07-1951 Referring Provider (PT): Haddix, Thomasene Lot, MD   Encounter Date: 04/02/2019  PT End of Session - 04/02/19 1152    Visit Number  5    Number of Visits  12    Date for PT Re-Evaluation  04/25/19    Authorization Type  [employer] MCR    PT Start Time  1140   patient arrived late   PT Stop Time  1215    PT Time Calculation (min)  35 min    Activity Tolerance  Patient tolerated treatment well    Behavior During Therapy  [facility] for tasks assessed/performed       Past Medical History:  Diagnosis Date  . Alcohol abuse   . Anxiety   . Depression   . ED (erectile dysfunction)   . Genital herpes   . GERD (gastroesophageal reflux disease)   . Hep C w/o coma, chronic ([place])   . Heroin abuse ([place])   . Lower extremity venous stasis   . Poor venous access    hx. of Heroin, Alcohol abuse "extreme difficult vein access"  . Reflux     Past Surgical History:  Procedure Laterality Date  . CHOLECYSTECTOMY    . I&D EXTREMITY  06/09/2012   Procedure: IRRIGATION AND DEBRIDEMENT EXTREMITY;  Surgeon: Linna Hoff, MD;  Location: [place];  Service: Orthopedics;  Laterality: Left;  . LAPAROTOMY N/A 08/01/2016   Procedure: EXPLORATORY LAPAROTOMY;  Surgeon: Clovis Riley, MD;  Location: [place];  Service: General;  Laterality: N/A;  . ORBITAL FRACTURE SURGERY    . ORIF TIBIA PLATEAU Right 11/14/2018   Procedure: OPEN REDUCTION INTERNAL FIXATION (ORIF) TIBIAL PLATEAU;  Surgeon: Shona Needles, MD;  Location: [place];  Service: Orthopedics;  Laterality: Right;  . SPLENECTOMY, TOTAL N/A 08/01/2016   Procedure: SPLENECTOMY;  Surgeon: Georganna Skeans, MD;  Location: [facility];  Service: General;  Laterality: N/A;  . TONSILLECTOMY      There were no  vitals filed for this visit.  Subjective Assessment - 04/02/19 1142    Subjective  Patient reports his ankle is doing a lot better. Patient noticed that yesterday his incision started draining again after a piece of the scab came off.    Currently in Pain?  Yes    Pain Score  7     Pain Location  Leg    Pain Orientation  Right    Pain Descriptors / Indicators  Aching    Pain Type  Chronic pain    Pain Onset  More than a month ago    Pain Frequency  Constant                       [facility] Adult PT Treatment/Exercise - 04/02/19 0001      Neuro Re-ed    Neuro Re-ed Details   Tandem balance 2x30 sec each, SL balance2x15 sec each      Exercises   Exercises  Ankle;Knee/Hip      Knee/Hip Exercises: Aerobic   Nustep  L4 LE/UE x8 min      Knee/Hip Exercises: Standing   Knee Flexion  10 reps;2 sets    Hip Abduction  10 reps;2 sets    Hip Extension  10 reps;2 sets  Knee/Hip Exercises: Seated   Sit to Sand  10 reps   no hands     Knee/Hip Exercises: Supine   Bridges  10 reps;2 sets    Straight Leg Raises  10 reps;2 sets      Modalities   Modalities  --      Vasopneumatic   Number Minutes Vasopneumatic   --    Vasopnuematic Location   --    Vasopneumatic Pressure  --    Vasopneumatic Temperature   --      Ankle Exercises: Stretches   Slant Board Stretch  30 seconds;3 reps      Ankle Exercises: Standing   Heel Raises  10 reps   2 sets     Ankle Exercises: Supine   T-Band  --             PT Education - 04/02/19 1149    Education Details  HEP,    Person(s) Educated  Patient    Methods  Explanation;Demonstration;Verbal cues;Handout    Comprehension  Verbalized understanding;Verbal cues required;Need further instruction       PT Short Term Goals - 02/28/19 1021      PT SHORT TERM GOAL #1   Title  Pt will be I and compliant with HEP (Target for all STG 4 weeks 03/30/19)    Status  New      PT SHORT TERM GOAL #2   Title  Pt will report 25%  reduction in pain overall    Baseline  8/10    Status  New        PT Long Term Goals - 02/28/19 1022      PT LONG TERM GOAL #1   Title  Pt will be able to negotiate at least one flight of stairs with manageable amount of pain so he can access his bedroom. (Target for all LTG 8 weeks 04/25/19)    Status  New      PT LONG TERM GOAL #2   Title  Pt will be able to ambulate community distances mod I at least 500 ft with LRAD    Status  New      PT LONG TERM GOAL #3   Title  Pt will improve BERG balance score to >47 to show improved balance.    Baseline  38    Status  New      PT LONG TERM GOAL #4   Title  Pt will improve Rt leg strength to overall 5-/5 MMT    Baseline  4 to 4+    Status  New            Plan - 04/02/19 1152    Clinical Impression Statement  Patient tolerated therapy well with no adverse effects. His ankle pain was better this visit so mainly focused on LE strengthening and balance training. He continues to exhibit weakness and balance deficit that affects his gait and mobility. He would benefit from continued skilled physical therapy to allow for improved functional mobility and decrease pain.    PT Treatment/Interventions  ADLs/Self Care Home Management;Cryotherapy;DME Instruction;Gait training;Stair training;Therapeutic activities;Therapeutic exercise;Balance training;Neuromuscular re-education;Manual techniques;Passive range of motion;Taping    PT Next Visit Plan  Re-evaluate HEP, progress LE strength and balance    PT Home Exercise Plan  LTR, bridges, SKTC, sit to stands no hands, tandem balance, sidestepping, ankle AROM, circles    Consulted and Agree with Plan of Care  Patient       Patient will benefit from  skilled therapeutic intervention in order to improve the following deficits and impairments:  Abnormal gait, Decreased activity tolerance, Decreased balance, Decreased endurance, Decreased mobility, Decreased range of motion, Decreased strength,  Difficulty walking, Pain, Decreased skin integrity  Visit Diagnosis: Pain in right lower leg  Muscle weakness (generalized)  Difficulty in walking, not elsewhere classified     Problem List Patient Active Problem List   Diagnosis Date Noted  . Fracture of tibial shaft, right, closed 11/22/2018  . Closed displaced fracture of right tibial spine 11/22/2018  . Closed bicondylar fracture of right tibial plateau 11/14/2018  . Polysubstance abuse ([place]) 11/14/2018  . Closed fracture of upper end of right fibula   . Poor venous access 03/07/2018  . 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 Bilateral lower leg cellulitis   . Venous insufficiency of both lower extremities   . Bilateral lower extremity edema   . Varicose veins of lower extremities with ulcer ([place])   . Severe opioid use disorder ([place]) 10/05/2013  . Abscess of left hip 10/04/2012  . Finger infection 06/09/2012  . Recurrent cellulitis of lower leg 10/12/2011  . Lower extremity venous stasis   . Reflux   . ALLERGIC RHINITIS 11/02/2007  . ERECTILE DYSFUNCTION 11/02/2007  . GENITAL HERPES 05/23/2007  . Chronic hepatitis C virus infection ([place]) 05/23/2007  . ANXIETY 05/23/2007  . HEROIN ABUSE 05/23/2007  . GERD 05/23/2007  . LOW BACK PAIN 05/23/2007    Hilda Blades, PT, DPT, LAT, ATC 04/02/19  1:22 PM Phone: [phone] Fax: [facility] [place], [place], [address] Phone: [phone]   Fax:  [phone]  Name: Bryan Powell MRN: [id number] Date of Birth: Sep 29, 1950   PHYSICAL THERAPY DISCHARGE SUMMARY  Visits from Start of Care: 5  Current functional level related to goals / functional outcomes: Unknown as patient did not return for further therapy, patient did not meet established functional goals   Remaining deficits: Pain, swelling, motion, strength, gait   Education / Equipment: HEP Plan:                                                     Patient goals were not met. Patient is being discharged due to not returning since the last visit.  ?????    Hilda Blades, PT, DPT, LAT, ATC 06/25/19  2:40 PM Phone: [phone] Fax: [phone]

## 2019-04-09 ENCOUNTER — Ambulatory Visit: Payer: Medicare HMO | Admitting: Physical Therapy

## 2019-04-16 ENCOUNTER — Ambulatory Visit: Payer: Medicare HMO | Admitting: Physical Therapy

## 2019-04-23 ENCOUNTER — Ambulatory Visit: Payer: Medicare HMO | Admitting: Physical Therapy

## 2019-06-04 ENCOUNTER — Other Ambulatory Visit: Payer: Self-pay

## 2019-06-04 DIAGNOSIS — I872 Venous insufficiency (chronic) (peripheral): Secondary | ICD-10-CM

## 2019-06-10 ENCOUNTER — Ambulatory Visit (INDEPENDENT_AMBULATORY_CARE_PROVIDER_SITE_OTHER): Payer: Medicare HMO | Admitting: Physician Assistant

## 2019-06-10 ENCOUNTER — Ambulatory Visit (HOSPITAL_COMMUNITY)
Admission: RE | Admit: 2019-06-10 | Discharge: 2019-06-10 | Disposition: A | Discharge status: Home / Self Care | Payer: Medicare HMO | Source: Ambulatory Visit | Attending: Family | Admitting: Family

## 2019-06-10 ENCOUNTER — Other Ambulatory Visit: Payer: Self-pay

## 2019-06-10 VITALS — BP 128/73 | HR 76 | Temp 97.7°F | Resp 20 | Ht 71.0 in | Wt 202.9 lb

## 2019-06-10 DIAGNOSIS — I872 Venous insufficiency (chronic) (peripheral): Secondary | ICD-10-CM | POA: Diagnosis present

## 2019-06-10 DIAGNOSIS — M7989 Other specified soft tissue disorders: Secondary | ICD-10-CM | POA: Diagnosis not present

## 2019-06-10 NOTE — Progress Notes (Signed)
[facility]           OF [place]  History and Physical   Bryan Powell is a 68 y.o. (04/01/1951) male who presents with hx of leg swelling.  He states about 10 years ago he was admitted to the hospital with cellulitis of his right leg.  He states he has had swelling in that leg since then.  He states back in June, he fell on his right leg when he fell off a ladder.  He states that he has some swelling behind the knee and around the knee.  He did have surgery by Dr. Jena GaussHaddix for this in June (see details below).    The pt does not hx of DVT.   The pt does have hx of varicose veins or ulcers.    There does skin changes .   The pt does not have family hx of venous issues.   The pt has tried compression socks and elevation and he states this helps.  He does not wear these often.    He has hx of u/s guided sclerotherapy of RLE varicosities and u/s guided endovenous laser occlusion of right great saphenous vein by IR on 09/29/15.    In June 2020, he underwent ORIF of right  Bicondylar tibial plateau fx with associated tibial shaft fx with right ankle pain by Dr. Jena GaussHaddix.  He was last seen by him on 04/23/2019.  At that time, he was to continue weight bearing as tolerated and no surgical intervention for his wound and to return in 3 months for xrays.  He is referred here for venous insufficiency of the lower extremities by Dr. Jena GaussHaddix.    He has hx of exploratory laparotomy with emergent splenectomy in February 2018.    The pt is not on a statin for cholesterol management.  The pt is not on a daily aspirin.   Other AC:  none The pt is not on medication for hypertension.   The pt is not diabetic.   Tobacco hx:  Remote-quit 2013  Past Medical History:  Diagnosis Date  . Alcohol abuse   . Anxiety   . Depression   . ED (erectile dysfunction)   . Genital herpes   . GERD (gastroesophageal reflux disease)   . Hep C w/o coma, chronic ([facility])   . Heroin abuse ([facility])     . Lower extremity venous stasis   . Poor venous access    hx. of Heroin, Alcohol abuse "extreme difficult vein access"  . Reflux     Past Surgical History:  Procedure Laterality Date  . CHOLECYSTECTOMY    . I & D EXTREMITY  06/09/2012   Procedure: IRRIGATION AND DEBRIDEMENT EXTREMITY;  Surgeon: Sharma CovertFred W Ortmann, MD;  [facility];  Service: Orthopedics;  Laterality: Left;  . LAPAROTOMY N/A 08/01/2016   Procedure: EXPLORATORY LAPAROTOMY;  Surgeon: Berna Buehelsea A Connor, MD;  Location: [facility];  Service: General;  Laterality: N/A;  . ORBITAL FRACTURE SURGERY    . ORIF TIBIA PLATEAU Right 11/14/2018   Procedure: OPEN REDUCTION INTERNAL FIXATION (ORIF) TIBIAL PLATEAU;  Surgeon: Roby LoftsHaddix, Kevin P, MD;  Location: [facility];  Service: Orthopedics;  Laterality: Right;  . SPLENECTOMY, TOTAL N/A 08/01/2016   Procedure: SPLENECTOMY;  Surgeon: Violeta GelinasBurke Thompson, MD;  [facility];  Service: General;  Laterality: N/A;  . TONSILLECTOMY      Social History   Socioeconomic History  . Marital status: Married  Spouse name: Not on file  . Number of children: Not on file  . Years of education: Not on file  . Highest education level: Not on file  Occupational History  . Not on file  Tobacco Use  . Smoking status: Former Smoker    Types: Cigarettes    Quit date: 04/15/2012    Years since quitting: 7.1  . Smokeless tobacco: Never Used  Substance and Sexual Activity  . Alcohol use: Yes    Alcohol/week: 0.0 standard drinks    Comment: in rehab  . Drug use: No    Types: Heroin    Comment:  reports heroine use today 08/01/16  . Sexual activity: Not on file  Other Topics Concern  . Not on file  Social History Narrative  . Not on file   Social Determinants of Health   Financial Resource Strain:   . Difficulty of Paying Living Expenses: Not on file  Food Insecurity:   . Worried About Charity fundraiser in the Last Year: Not on file  . Ran Out of Food in the Last Year: Not on file  Transportation Needs:    . Lack of Transportation (Medical): Not on file  . Lack of Transportation (Non-Medical): Not on file  Physical Activity:   . Days of Exercise per Week: Not on file  . Minutes of Exercise per Session: Not on file  Stress:   . Feeling of Stress : Not on file  Social Connections:   . Frequency of Communication with Friends and Family: Not on file  . Frequency of Social Gatherings with Friends and Family: Not on file  . Attends Religious Services: Not on file  . Active Member of Clubs or Organizations: Not on file  . Attends Archivist Meetings: Not on file  . Marital Status: Not on file  Intimate Partner Violence:   . Fear of Current or Ex-Partner: Not on file  . Emotionally Abused: Not on file  . Physically Abused: Not on file  . Sexually Abused: Not on file     Family History  Problem Relation Age of Onset  . Stroke Mother   . Heart disease Father     Current Outpatient Medications  Medication Sig Dispense Refill  . esomeprazole (NEXIUM) 20 MG capsule Take 1 capsule (20 mg total) by mouth daily at 12 noon. For acid reflux 30 capsule 0  . feeding supplement, ENSURE ENLIVE, (ENSURE ENLIVE) LIQD Take 237 mLs by mouth 2 (two) times daily between meals. 778 mL 12  . folic acid (FOLVITE) 1 MG tablet Take 1 tablet (1 mg total) by mouth daily. 30 tablet 0  . gabapentin (NEURONTIN) 300 MG capsule Take 300 mg by mouth 3 (three) times daily.    Marland Kitchen LORazepam (ATIVAN) 0.5 MG tablet Take 0.5 mg by mouth 2 (two) times daily.    . methocarbamol (ROBAXIN) 500 MG tablet Take 1 tablet (500 mg total) by mouth every 6 (six) hours as needed for muscle spasms. 10 tablet 0  . Multiple Vitamin (MULTIVITAMIN WITH MINERALS) TABS tablet Take 1 tablet by mouth daily. For low vitamin (Patient not taking: Reported on 11/14/2018)    . nicotine (NICODERM CQ - DOSED IN MG/24 HOURS) 21 mg/24hr patch Place 1 patch (21 mg total) onto the skin daily. 28 patch 0  . oxyCODONE (OXY IR/ROXICODONE) 5 MG  immediate release tablet Take 5 mg by mouth 4 (four) times daily.    Marland Kitchen thiamine 100 MG tablet Take 1 tablet (  100 mg total) by mouth daily. 30 tablet 0  . traZODone (DESYREL) 150 MG tablet Take 150 mg by mouth at bedtime.     No current facility-administered medications for this visit.    No Known Allergies  REVIEW OF SYSTEMS:    denotes positive finding,  denotes negative finding Cardiac  Comments:  Chest pain or chest pressure:    Shortness of breath upon exertion:    Short of breath when lying flat:    Irregular heart rhythm:        Vascular    Pain in calf, thigh, or hip brought on by ambulation:    Pain in feet at night that wakes you up from your sleep:     Blood clot in your veins:    Leg swelling:  x       Pulmonary    Oxygen at home:    Productive cough:     Wheezing:         Neurologic    Sudden weakness in arms or legs:     Sudden numbness in arms or legs:     Sudden onset of difficulty speaking or slurred speech:    Temporary loss of vision in one eye:     Problems with dizziness:         Gastrointestinal    Blood in stool:     Vomited blood:         Genitourinary    Burning when urinating:     Blood in urine:        Psychiatric    Major depression:         Hematologic    Bleeding problems:    Problems with blood clotting too easily:        Skin    Rashes or ulcers:        Constitutional    Fever or chills:      PHYSICAL EXAMINATION:  Today's Vitals   06/10/19 1338  BP: 128/73  Pulse: 76  Resp: 20  Temp: 97.7 F (36.5 C)  SpO2: 94%  Weight: 202 lb 14.4 oz (92 kg)  Height:  (1.803 m)  PainSc: 3    Body mass index is 28.3 kg/m.   General:  WDWN in NAD; vital signs documented above Gait: Not observed HENT: WNL, normocephalic Pulmonary: normal non-labored breathing , without Rales, rhonchi,  wheezing Cardiac: regular HR; without carotid bruits Abdomen: soft, NT, no masses Skin: without rashes Vascular Exam/Pulses:   Right Left  Radial 2+ (normal) 2+ (normal)  Ulnar 1+ (weak) 1+ (weak)  DP 2+ (normal) 2+ (normal)  PT Unable to palpate  Unable to palpate    Extremities:     Musculoskeletal: no muscle wasting or atrophy  Neurologic: A&O X 3;  No focal weakness or paresthesias are detected Psychiatric:  The pt has Normal affect.   Non-Invasive Vascular Imaging:   Venous duplex on 06/10/2019: Venous Reflux Times +--------------+------+-----------+------------+-------------+ RIGHT         RefluxReflux TimeDiameter cmsComments                     Yes                                       +--------------+------+-----------+------------+-------------+ CFV            yes     1496                              +--------------+------+-----------+------------+-------------+  Popliteal      yes     1074                              +--------------+------+-----------+------------+-------------+ GSV at SFJ                         0.60                  +--------------+------+-----------+------------+-------------+ GSV prox thigh yes     5002        0.36    out of fascia +--------------+------+-----------+------------+-------------+ GSV mid thigh                      0.20                  +--------------+------+-----------+------------+-------------+ GSV dist thigh                             NV            +--------------+------+-----------+------------+-------------+ GSV at knee                                NV            +--------------+------+-----------+------------+-------------+ GSV prox calf                      0.20                  +--------------+------+-----------+------------+-------------+ GSV mid calf                       0.14                  +--------------+------+-----------+------------+-------------+ SSV Pop Fossa  yes     1548        0.29                   +--------------+------+-----------+------------+-------------+ SSV prox calf  yes     1790        0.39                  +--------------+------+-----------+------------+-------------+ SSV mid calf                       0.36                  +--------------+------+-----------+------------+-------------+    +--------------+------+-----------+------------+--------+ LEFT          RefluxReflux TimeDiameter cmsComments                Yes                                  +--------------+------+-----------+------------+--------+ CFV            yes     3257                         +--------------+------+-----------+------------+--------+ GSV at SFJ     yes     4107        0.84             +--------------+------+-----------+------------+--------+ GSV prox thigh  0.52             +--------------+------+-----------+------------+--------+ GSV mid thigh  yes     4672        0.48             +--------------+------+-----------+------------+--------+ GSV dist thigh yes     4973        0.42             +--------------+------+-----------+------------+--------+ GSV at knee    yes     5032        0.52             +--------------+------+-----------+------------+--------+ GSV prox calf  yes     3917        0.16             +--------------+------+-----------+------------+--------+ GSV mid calf                               NV       +--------------+------+-----------+------------+--------+ SSV Pop Fossa  yes     1430        0.33             +--------------+------+-----------+------------+--------+ SSV prox calf                      0.31             +--------------+------+-----------+------------+--------+ SSV mid calf   yes     3528        0.36             +--------------+------+-----------+------------+--------+       Summary: Right: There is no evidence of acute deep vein thrombosis in the  lower extremity from the common femoral vein to the popliteal vein. Reflux in the CFV and popliteal vein. Reflux in the Greater saphenous vein in the proximal thigh area. . Reflux in the  small saphenous vein at the fossa and proximal calf. Enlarged lymph nodes visualized. Left: There is no evidence of acute deep vein thrombosis in the lower extremity. Reflux in the CFA. Reflux in the SFJ, Reflux in the GSV mid thigh to the proximal calf. Reflux in the SSV in the fossa and mid calf area. Enlarged lymph nodes visualized.   Bryan Powell is a 68 y.o. male who presents with: BLE swelling with the right >left.  He  Has had chronic swelling in the right leg for many years, but worse after his fall in June.   -Recommend 20-60mmHg thigh high compression socks for 3 months along with elevation.   -will have him return in 3 months to follow up with Dr. Darrick Penna or Dr. Edilia Bo for evaluation for further intervention.   -He has hx of endovascular ablation of the right GSV by IR ~ 3 years ago.  He does have reflux in the GSV on the right in the proximal thigh area.  There is also reflux in the SSV.   -on the left, he does have reflux in the SFJ, GSV mid thigh to proximal calf, & the SSV, however, the left leg is less symptomatic than the right.   -he did leave before being measured-nurse called pt to give information on Elastic Therapy and that he would need 20-67mmHg thigh high compression socks.   -he will call sooner should he have issues before then.   Bryan Powell, [facility] 06/10/2019 8:21 AM  Clinic MD:  Darrick Penna (on  call MD)

## 2019-06-11 ENCOUNTER — Encounter: Payer: Self-pay | Admitting: Vascular Surgery

## 2019-08-28 ENCOUNTER — Ambulatory Visit (HOSPITAL_COMMUNITY)
Admission: EM | Admit: 2019-08-28 | Discharge: 2019-08-28 | Disposition: A | Discharge status: Home / Self Care | Payer: Medicare HMO | Attending: Family Medicine | Admitting: Family Medicine

## 2019-08-28 ENCOUNTER — Ambulatory Visit (INDEPENDENT_AMBULATORY_CARE_PROVIDER_SITE_OTHER): Payer: Medicare HMO

## 2019-08-28 ENCOUNTER — Other Ambulatory Visit: Payer: Self-pay

## 2019-08-28 ENCOUNTER — Encounter (HOSPITAL_COMMUNITY): Payer: Self-pay | Admitting: Emergency Medicine

## 2019-08-28 DIAGNOSIS — T1490XA Injury, unspecified, initial encounter: Secondary | ICD-10-CM

## 2019-08-28 DIAGNOSIS — S20219A Contusion of unspecified front wall of thorax, initial encounter: Secondary | ICD-10-CM

## 2019-08-28 DIAGNOSIS — R0789 Other chest pain: Secondary | ICD-10-CM | POA: Diagnosis not present

## 2019-08-28 DIAGNOSIS — S20213A Contusion of bilateral front wall of thorax, initial encounter: Secondary | ICD-10-CM

## 2019-08-28 NOTE — ED Triage Notes (Signed)
Fell on tile floor in bathroom and fell face forward, his chest hit the floor first. He denies his head or face hitting the floor. Pain is constant, worse with deep breaths.

## 2019-08-28 NOTE — ED Triage Notes (Signed)
No shortness of breath.

## 2019-08-29 NOTE — ED Provider Notes (Signed)
[facility]   [id number] 08/28/19 Arrival Time: 4098  ASSESSMENT & PLAN:  1. Bilateral contusion of ribs   2. Injury     I have personally viewed the imaging studies ordered this visit. I could not appreciated any acute rib fractures. No pneumothorax.  Takes oxycodone for chronic pain. To continue. Discussed his ribs may hurt for a couple of weeks.   Orders Placed This Encounter  Procedures  . DG Ribs Bilateral W/Chest    Recommend: Follow-up Information    Thressa Sheller, MD.   Specialty: Internal Medicine Why: If worsening or failing to improve as anticipated. Contact information: [address], [address] [place] [address] [phone]           [place] Controlled Substances Registry consulted for this patient.  Reviewed expectations re: course of current medical issues. Questions answered. Outlined signs and symptoms indicating need for more acute intervention. Patient verbalized understanding. After Visit Summary given.  SUBJECTIVE: History from: patient. Bryan Powell is a 69 y.o. male who reports falling forward yesterday. "My chest hit the floor first". Reports continued bilateral anterior rib pain. No specific SOB. Ambulatory without difficulty. Without n/v. No head injury. No OTC tx.   Past Surgical History:  Procedure Laterality Date  . CHOLECYSTECTOMY    . I & D EXTREMITY  06/09/2012   Procedure: IRRIGATION AND DEBRIDEMENT EXTREMITY;  Surgeon: Linna Hoff, MD;  Location: [place];  Service: Orthopedics;  Laterality: Left;  . LAPAROTOMY N/A 08/01/2016   Procedure: EXPLORATORY LAPAROTOMY;  Surgeon: Clovis Riley, MD;  Location: [place];  Service: General;  Laterality: N/A;  . ORBITAL FRACTURE SURGERY    . ORIF TIBIA PLATEAU Right 11/14/2018   Procedure: OPEN REDUCTION INTERNAL FIXATION (ORIF) TIBIAL PLATEAU;  Surgeon: Shona Needles, MD;  Location: [place];  Service: Orthopedics;  Laterality: Right;  . SPLENECTOMY, TOTAL N/A  08/01/2016   Procedure: SPLENECTOMY;  Surgeon: Georganna Skeans, MD;  Location: [place];  Service: General;  Laterality: N/A;  . TONSILLECTOMY        OBJECTIVE:  Vitals:   08/28/19 1813  BP: 119/69  Pulse: 90  Resp: 16  Temp: 98.2 F (36.8 C)  TempSrc: Oral  SpO2: 94%    General appearance: alert; no distress HEENT: [place]; AT Neck: supple with FROM Resp: unlabored respirations; lungs CTAB CV: RRR Chest Wall: poorly localized TTP over bilateral anterior ribs (mostly upper chest) Skin: warm and dry; no visible rashes Neurologic: gait normal Psychological: alert and cooperative; normal mood and affect  Imaging: DG Ribs Bilateral W/Chest  Result Date: 08/28/2019 CLINICAL DATA:  Chest pain after injury. EXAM: BILATERAL RIBS AND CHEST - 4+ VIEW COMPARISON:  Chest and right rib series 11/14/2018 FINDINGS: There are remote fractures of bilateral ribs. Fracture of left anterior fifth rib is more lateral than the previous rib fracture and may be acute. This is nondisplaced. All visualized rib fractures on the right appear old. No pneumothorax or pulmonary complication. Unchanged heart size and mediastinal contours. Aortic atherosclerosis. Mixed density lesion in the left proximal humerus with sclerotic margins appears similar to prior exam. IMPRESSION: 1. Remote bilateral rib fractures. Nondisplaced fracture of left fifth rib is more anterior to the prior rib fracture is age indeterminate, may be acute. Recommend correlation with focal tenderness. 2. Lesion in the left proximal humerus was seen on prior exam and appears similar. This is incompletely evaluated, but stability suggests a nonaggressive lesion. Electronically Signed   By: Aurther Loft.D.  On: 08/28/2019 19:49      No Known Allergies  Past Medical History:  Diagnosis Date  . Alcohol abuse   . Anxiety   . Depression   . ED (erectile dysfunction)   . Genital herpes   . GERD (gastroesophageal reflux disease)   . Hep C w/o  coma, chronic (HCC)   . Heroin abuse (HCC)   . Hypertension   . Lower extremity venous stasis   . Poor venous access    hx. of Heroin, Alcohol abuse "extreme difficult vein access"  . Reflux    Social History   Socioeconomic History  . Marital status: Married    Spouse name: Not on file  . Number of children: Not on file  . Years of education: Not on file  . Highest education level: Not on file  Occupational History  . Not on file  Tobacco Use  . Smoking status: Former Smoker    Types: Cigarettes    Quit date: 04/15/2012    Years since quitting: 7.3  . Smokeless tobacco: Never Used  Substance and Sexual Activity  . Alcohol use: Yes    Alcohol/week: 0.0 standard drinks    Comment: in rehab  . Drug use: No    Types: Heroin    Comment:  reports heroine use today 08/01/16  . Sexual activity: Not on file  Other Topics Concern  . Not on file  Social History Narrative  . Not on file   Social Determinants of Health   Financial Resource Strain:   . Difficulty of Paying Living Expenses:   Food Insecurity:   . Worried About Programme researcher, broadcasting/film/video in the Last Year:   . Barista in the Last Year:   Transportation Needs:   . Freight forwarder (Medical):   Marland Kitchen Lack of Transportation (Non-Medical):   Physical Activity:   . Days of Exercise per Week:   . Minutes of Exercise per Session:   Stress:   . Feeling of Stress :   Social Connections:   . Frequency of Communication with Friends and Family:   . Frequency of Social Gatherings with Friends and Family:   . Attends Religious Services:   . Active Member of Clubs or Organizations:   . Attends Banker Meetings:   Marland Kitchen Marital Status:    Family History  Problem Relation Age of Onset  . Stroke Mother   . Heart disease Father    Past Surgical History:  Procedure Laterality Date  . CHOLECYSTECTOMY    . I & D EXTREMITY  06/09/2012   Procedure: IRRIGATION AND DEBRIDEMENT EXTREMITY;  Surgeon: Sharma Covert,  MD;  [facility];  Service: Orthopedics;  Laterality: Left;  . LAPAROTOMY N/A 08/01/2016   Procedure: EXPLORATORY LAPAROTOMY;  Surgeon: Berna Bue, MD;  [facility];  Service: General;  Laterality: N/A;  . ORBITAL FRACTURE SURGERY    . ORIF TIBIA PLATEAU Right 11/14/2018   Procedure: OPEN REDUCTION INTERNAL FIXATION (ORIF) TIBIAL PLATEAU;  Surgeon: Roby Lofts, MD;  [facility];  Service: Orthopedics;  Laterality: Right;  . SPLENECTOMY, TOTAL N/A 08/01/2016   Procedure: SPLENECTOMY;  Surgeon: Violeta Gelinas, MD;  Location: [facility] OR;  Service: General;  Laterality: N/A;  . Greig Right, MD 08/29/19 0930

## 2019-09-11 ENCOUNTER — Ambulatory Visit: Payer: Medicare HMO | Attending: Student | Admitting: Physical Therapy

## 2019-09-11 ENCOUNTER — Ambulatory Visit: Payer: Medicare HMO | Admitting: Vascular Surgery

## 2019-09-13 ENCOUNTER — Ambulatory Visit: Payer: Medicare HMO | Admitting: Vascular Surgery

## 2019-09-26 ENCOUNTER — Ambulatory Visit: Payer: Medicare HMO | Attending: Student

## 2019-09-26 ENCOUNTER — Other Ambulatory Visit: Payer: Self-pay

## 2019-09-26 DIAGNOSIS — R262 Difficulty in walking, not elsewhere classified: Secondary | ICD-10-CM | POA: Insufficient documentation

## 2019-09-26 DIAGNOSIS — M25651 Stiffness of right hip, not elsewhere classified: Secondary | ICD-10-CM | POA: Insufficient documentation

## 2019-09-26 DIAGNOSIS — M79661 Pain in right lower leg: Secondary | ICD-10-CM | POA: Insufficient documentation

## 2019-09-26 DIAGNOSIS — R293 Abnormal posture: Secondary | ICD-10-CM | POA: Insufficient documentation

## 2019-09-26 DIAGNOSIS — M6281 Muscle weakness (generalized): Secondary | ICD-10-CM | POA: Diagnosis present

## 2019-09-26 NOTE — Therapy (Addendum)
[facility] [address] [place], [place], [address] Phone: [phone]   Fax:  [phone]  Physical Therapy Evaluation  Patient Details  Name: Bryan Powell MRN: [id number] Date of Birth: 01/31/1951 Referring Provider (PT): Truitt Merle, MD    See note below for Objective Data and Assessment of Progress/Goals.      Encounter Date: 09/26/2019  PT End of Session - 09/26/19 1306    Visit Number  1    Number of Visits  10    Date for PT Re-Evaluation  11/01/19    Authorization Type  [employer] MCR    PT Start Time  1230   15 min late   PT Stop Time  1300    PT Time Calculation (min)  30 min    Activity Tolerance  Patient tolerated treatment well    Behavior During Therapy  WFL for tasks assessed/performed       Past Medical History:  Diagnosis Date  . Alcohol abuse   . Anxiety   . Depression   . ED (erectile dysfunction)   . Genital herpes   . GERD (gastroesophageal reflux disease)   . Hep C w/o coma, chronic (HCC)   . Heroin abuse (HCC)   . Hypertension   . Lower extremity venous stasis   . Poor venous access    hx. of Heroin, Alcohol abuse "extreme difficult vein access"  . Reflux     Past Surgical History:  Procedure Laterality Date  . CHOLECYSTECTOMY    . I & D EXTREMITY  06/09/2012   Procedure: IRRIGATION AND DEBRIDEMENT EXTREMITY;  Surgeon: Sharma Covert, MD;  [facility];  Service: Orthopedics;  Laterality: Left;  . LAPAROTOMY N/A 08/01/2016   Procedure: EXPLORATORY LAPAROTOMY;  Surgeon: Berna Bue, MD;  [facility];  Service: General;  Laterality: N/A;  . ORBITAL FRACTURE SURGERY    . ORIF TIBIA PLATEAU Right 11/14/2018   Procedure: OPEN REDUCTION INTERNAL FIXATION (ORIF) TIBIAL PLATEAU;  Surgeon: Roby Lofts, MD;  [facility];  Service: Orthopedics;  Laterality: Right;  . SPLENECTOMY, TOTAL N/A 08/01/2016   Procedure: SPLENECTOMY;  Surgeon: Violeta Gelinas, MD;  Location: [facility] OR;   Service: General;  Laterality: N/A;  . TONSILLECTOMY      There were no vitals filed for this visit.   Subjective Assessment - 09/26/19 1235    Subjective  He report broken leg 11/2018/  Still not where I wNT TO BE.  ORIF from fall from ladder. Tibia plateau fracture.    He was in PT but did not finish POC.    MD said  fracture was healed.  Lt knee  weak now with popping.    Limitations  Walking;Standing    How long can you walk comfortably?  Not sure.    Diagnostic tests  xray:  RT knee healed   LT negative    Patient Stated Goals  Wants more mobility/ ROM RT knee , able to be back to normal    Currently in Pain?  No/denies         [facility] PT Assessment - 09/26/19 0001      Assessment   Medical Diagnosis  RT tibial plateau fracture    Referring Provider (PT)  Truitt Merle, MD     Onset Date/Surgical Date  --   11/14/2018   Next MD Visit  3 months    Prior Therapy  Yes . Did not complete POC      Precautions  Precautions  None      Restrictions   Weight Bearing Restrictions  No      Balance Screen   Has the patient fallen in the past 6 months  Yes    How many times?  1    Has the patient had a decrease in activity level because of a fear of falling?   Yes    Is the patient reluctant to leave their home because of a fear of falling?   No      Prior Function   Level of Independence  Independent;Requires assistive device for independence    Vocation  Retired      Charity fundraiser Status  Within Functional Limits for tasks assessed      Posture/Postural Control   Posture Comments  RT lateral trunkk shift, LT pelvis higher      ROM / Strength   AROM / PROM / Strength  AROM;Strength;PROM      AROM   AROM Assessment Site  Knee    Right/Left Knee  Right    Right Knee Extension  -18    Right Knee Flexion  120      PROM   PROM Assessment Site  Knee    Right/Left Knee  Right    Right Knee Extension  -14    Right Knee Flexion  122      Strength    Strength Assessment Site  Knee;Ankle;Hip    Right/Left Hip  Right    Right Hip Flexion  5/5    Right Hip Extension  4/5    Right Hip External Rotation   4/5    Right Hip Internal Rotation  5/5    Right Hip ABduction  4+/5    Right/Left Knee  Right    Right Knee Flexion  5/5    Right Knee Extension  5/5    Right/Left Ankle  Right    Right Ankle Dorsiflexion  5/5    Right Ankle Inversion  5/5    Right Ankle Eversion  5/5                Objective measurements completed on examination: See above findings.              PT Education - 09/26/19 1241    Education Details  POC    Person(s) Educated  Patient    Methods  Explanation    Comprehension  Verbalized understanding          PT Long Term Goals - 09/26/19 1308      PT LONG TERM GOAL #1   Title  He will be independent with all HEp issued    Time  5    Period  Weeks    Status  New      PT LONG TERM GOAL #2   Title  He will repeot able to get in and out of car with min effort.    Time  5    Period  Weeks    Status  New      PT LONG TERM GOAL #4   Title  Pt will improve Rt hip  strength to overall 4+/5 MMT    Time  5    Period  Weeks    Status  New             Plan - 09/26/19 1310    Clinical Impression Statement  Bryan Powell presents 10 months post ORIF with complaints of weakness  and stiffmess in RT leg. Other than getting in and out of his car he was not specific about limitations.  He does show some stiffness of RT knee but also has similar stiffness of LT knee. He has more stiffness of RT hip. His gluteal strenth is weak other wise his LE strength is normal. He shows lateral shift and wide BOS in standing. He was late for eval so will asseess balance and initiate HEP next session. He appears he needs good HEP  and progeession with skilled PT to improve function.    Personal Factors and Comorbidities  Time since onset of injury/illness/exacerbation    Examination-Activity Limitations   Locomotion Level    PT Frequency  2x / week    PT Duration  --   5 weeks   PT Treatment/Interventions  Patient/family education;Manual techniques;Therapeutic exercise;Passive range of motion;Balance training    PT Next Visit Plan  BERG test,  Initiate HEP    Consulted and Agree with Plan of Care  Patient       Patient will benefit from skilled therapeutic intervention in order to improve the following deficits and impairments:  Postural dysfunction, Decreased strength, Decreased range of motion, Difficulty walking, Decreased activity tolerance  Visit Diagnosis: Muscle weakness (generalized)  Difficulty in walking, not elsewhere classified  Stiffness of right hip, not elsewhere classified  Abnormal posture     Problem List Patient Active Problem List   Diagnosis Date Noted  . Fracture of tibial shaft, right, closed 11/22/2018  . Closed displaced fracture of right tibial spine 11/22/2018  . Closed bicondylar fracture of right tibial plateau 11/14/2018  . Polysubstance abuse (HCC) 11/14/2018  . Closed fracture of upper end of right fibula   . Poor venous access 03/07/2018  . 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 Bilateral lower leg cellulitis   . Venous insufficiency of both lower extremities   . Bilateral lower extremity edema   . Varicose veins of lower extremities with ulcer (HCC)   . Severe opioid use disorder (HCC) 10/05/2013  . Abscess of left hip 10/04/2012  . Finger infection 06/09/2012  . Recurrent cellulitis of lower leg 10/12/2011  . Lower extremity venous stasis   . Reflux   . ALLERGIC RHINITIS 11/02/2007  . ERECTILE DYSFUNCTION 11/02/2007  . GENITAL HERPES 05/23/2007  . Chronic hepatitis C virus infection (HCC) 05/23/2007  . ANXIETY 05/23/2007  . HEROIN ABUSE 05/23/2007  . GERD 05/23/2007  . LOW BACK PAIN 05/23/2007    Caprice Red  PT 09/26/2019, 1:22 PM  [facility] [address] [place], [place],  [address] Phone: [phone]   Fax:  [phone]  Name: Bryan Powell MRN: [id number] Date of Birth: Nov 01, 1950

## 2019-09-30 ENCOUNTER — Ambulatory Visit: Payer: Medicare HMO | Admitting: Physical Therapy

## 2019-10-10 ENCOUNTER — Ambulatory Visit (HOSPITAL_COMMUNITY)
Admission: EM | Admit: 2019-10-10 | Discharge: 2019-10-10 | Disposition: A | Discharge status: Home / Self Care | Payer: Medicare HMO | Attending: Family Medicine | Admitting: Family Medicine

## 2019-10-10 ENCOUNTER — Other Ambulatory Visit: Payer: Self-pay

## 2019-10-10 ENCOUNTER — Ambulatory Visit: Payer: Medicare HMO

## 2019-10-10 ENCOUNTER — Encounter (HOSPITAL_COMMUNITY): Payer: Self-pay | Admitting: Family Medicine

## 2019-10-10 DIAGNOSIS — M6281 Muscle weakness (generalized): Secondary | ICD-10-CM

## 2019-10-10 DIAGNOSIS — R262 Difficulty in walking, not elsewhere classified: Secondary | ICD-10-CM

## 2019-10-10 DIAGNOSIS — Z79899 Other long term (current) drug therapy: Secondary | ICD-10-CM

## 2019-10-10 DIAGNOSIS — M25651 Stiffness of right hip, not elsewhere classified: Secondary | ICD-10-CM

## 2019-10-10 DIAGNOSIS — R293 Abnormal posture: Secondary | ICD-10-CM

## 2019-10-10 DIAGNOSIS — M545 Low back pain, unspecified: Secondary | ICD-10-CM

## 2019-10-10 DIAGNOSIS — W19XXXA Unspecified fall, initial encounter: Secondary | ICD-10-CM | POA: Diagnosis not present

## 2019-10-10 MED ORDER — LIDOCAINE 5 % EX PTCH: 1.0000 | MEDICATED_PATCH | CUTANEOUS | 0 refills | Status: DC

## 2019-10-10 NOTE — Therapy (Signed)
[place], [place], [address] Phone: [phone]   Fax:  [phone]  Physical Therapy Treatment  Patient Details  Name: Bryan Powell MRN: [id number] Date of Birth: 06-08-1951 Referring Provider (PT): Katha Hamming, MD    Encounter Date: 10/10/2019  PT End of Session - 10/10/19 1208    Visit Number  2    Number of Visits  10    Date for PT Re-Evaluation  11/01/19    Authorization Type  [employer] MCR    PT Start Time  1143   Pt late   PT Stop Time  1215    PT Time Calculation (min)  32 min    Activity Tolerance  Patient tolerated treatment well    Behavior During Therapy  [facility] for tasks assessed/performed       Past Medical History:  Diagnosis Date  . Alcohol abuse   . Anxiety   . Depression   . ED (erectile dysfunction)   . Genital herpes   . GERD (gastroesophageal reflux disease)   . Hep C w/o coma, chronic ([place])   . Heroin abuse ([place])   . Hypertension   . Lower extremity venous stasis   . Poor venous access    hx. of Heroin, Alcohol abuse "extreme difficult vein access"  . Reflux     Past Surgical History:  Procedure Laterality Date  . CHOLECYSTECTOMY    . I & D EXTREMITY  06/09/2012   Procedure: IRRIGATION AND DEBRIDEMENT EXTREMITY;  Surgeon: Linna Hoff, MD;  Location: [place];  Service: Orthopedics;  Laterality: Left;  . LAPAROTOMY N/A 08/01/2016   Procedure: EXPLORATORY LAPAROTOMY;  Surgeon: Clovis Riley, MD;  Location: [place];  Service: General;  Laterality: N/A;  . ORBITAL FRACTURE SURGERY    . ORIF TIBIA PLATEAU Right 11/14/2018   Procedure: OPEN REDUCTION INTERNAL FIXATION (ORIF) TIBIAL PLATEAU;  Surgeon: Shona Needles, MD;  Location: [place];  Service: Orthopedics;  Laterality: Right;  . SPLENECTOMY, TOTAL N/A 08/01/2016   Procedure: SPLENECTOMY;  Surgeon: Georganna Skeans, MD;  Location: [place];  Service: General;  Laterality: N/A;  . TONSILLECTOMY      There were no vitals  filed for this visit.  Subjective Assessment - 10/10/19 1216    Subjective  Fell  now with LBP.  Pain iwth all actticity. Wants PT to treat but without MD assessment I told him we would not treat excet with hot pad    Currently in Pain?  Yes    Pain Score  --   Moderate LBPOP across back. from fall                      [facility] Adult PT Treatment/Exercise - 10/10/19 0001      Exercises   Exercises  Knee/Hip      Knee/Hip Exercises: Stretches   Other Knee/Hip Stretches  lateral shift in LT side lye pillow under pelvis      Knee/Hip Exercises: Aerobic   Nustep  L4 6 min UE/LE      Knee/Hip Exercises: Standing   Heel Raises  Both;20 reps    Knee Flexion  Right;Left;10 reps    Hip Flexion  Right;Left;10 reps    Hip Extension  Right;Left;10 reps      Knee/Hip Exercises: Seated   Long Arc Quad  Right;Left;15 reps    Long Arc Quad Limitations  5 sec hold      Knee/Hip Exercises: Supine  Bridges  Both;5 reps    Straight Leg Raises  Right;Left;10 reps    Other Supine Knee/Hip Exercises  PPT x10    Other Supine Knee/Hip Exercises  bent knee raise x 10 RT/LT             PT Education - 10/10/19 1213    Education Details  HEP    Person(s) Educated  Patient    Methods  Explanation;Verbal cues;Handout;Tactile cues    Comprehension  Verbalized understanding          PT Long Term Goals - 09/26/19 1308      PT LONG TERM GOAL #1   Title  He will be independent with all HEp issued    Time  5    Period  Weeks    Status  New      PT LONG TERM GOAL #2   Title  He will repeot able to get in and out of car with min effort.    Time  5    Period  Weeks    Status  New      PT LONG TERM GOAL #4   Title  Pt will improve Rt hip  strength to overall 4+/5 MMT    Time  5    Period  Weeks    Status  New              Patient will benefit from skilled therapeutic intervention in order to improve the following deficits and impairments:     Visit  Diagnosis: Muscle weakness (generalized)  Difficulty in walking, not elsewhere classified  Stiffness of right hip, not elsewhere classified  Abnormal posture     Problem List Patient Active Problem List   Diagnosis Date Noted  . Fracture of tibial shaft, right, closed 11/22/2018  . Closed displaced fracture of right tibial spine 11/22/2018  . Closed bicondylar fracture of right tibial plateau 11/14/2018  . Polysubstance abuse (HCC) 11/14/2018  . Closed fracture of upper end of right fibula   . Poor venous access 03/07/2018  . 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 Bilateral lower leg cellulitis   . Venous insufficiency of both lower extremities   . Bilateral lower extremity edema   . Varicose veins of lower extremities with ulcer (HCC)   . Severe opioid use disorder (HCC) 10/05/2013  . Abscess of left hip 10/04/2012  . Finger infection 06/09/2012  . Recurrent cellulitis of lower leg 10/12/2011  . Lower extremity venous stasis   . Reflux   . ALLERGIC RHINITIS 11/02/2007  . ERECTILE DYSFUNCTION 11/02/2007  . GENITAL HERPES 05/23/2007  . Chronic hepatitis C virus infection (HCC) 05/23/2007  . ANXIETY 05/23/2007  . HEROIN ABUSE 05/23/2007  . GERD 05/23/2007  . LOW BACK PAIN 05/23/2007    Caprice Red  PT 10/10/2019, 1:29 PM  [facility] [address] [place], [place], [address] Phone: [phone]   Fax:  [phone]  Name: Bryan Powell MRN: [id number] Date of Birth: 08/18/1950

## 2019-10-10 NOTE — ED Triage Notes (Signed)
Patient reports fall last week. C/o lower back pain.

## 2019-10-10 NOTE — Patient Instructions (Signed)
Lye left side pi;llow under hips x 5 min 2x/day for lateral shift

## 2019-10-10 NOTE — ED Provider Notes (Signed)
[facility]    CSN: [id number] Arrival date & time: 10/10/19  1301      History   Chief Complaint Chief Complaint  Patient presents with  . Fall    HPI Bryan Powell is a 69 y.o. male.   [facility] patient visit.  69 yo man experiencing back pain after falling 1 week ago while walking to the bathroom.  His pain is in the lumbar area and he says that he heard a "pop" when he fell.  He has had no weakness or numbness in his lower extremities.  Patient is taking 20 mg of oxycodone 3 times a day.  He has had 2 falls now in the last month.  He is seeking additional medication for his back.    Note from 08/28/2019: Bryan Powell is a 69 y.o. male who reports falling forward yesterday. "My chest hit the floor first". Reports continued bilateral anterior rib pain. No specific SOB. Ambulatory without difficulty. Without n/v. No head injury.     Past Medical History:  Diagnosis Date  . Alcohol abuse   . Anxiety   . Depression   . ED (erectile dysfunction)   . Genital herpes   . GERD (gastroesophageal reflux disease)   . Hep C w/o coma, chronic (HCC)   . Heroin abuse (HCC)   . Hypertension   . Lower extremity venous stasis   . Poor venous access    hx. of Heroin, Alcohol abuse "extreme difficult vein access"  . Reflux     Patient Active Problem List   Diagnosis Date Noted  . Fracture of tibial shaft, right, closed 11/22/2018  . Closed displaced fracture of right tibial spine 11/22/2018  . Closed bicondylar fracture of right tibial plateau 11/14/2018  . Polysubstance abuse (HCC) 11/14/2018  . Closed fracture of upper end of right fibula   . Poor venous access 03/07/2018  . 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 Bilateral lower leg cellulitis   . Venous insufficiency of both lower extremities   . Bilateral lower extremity edema   . Varicose veins of lower extremities with ulcer (HCC)   . Severe opioid use disorder (HCC) 10/05/2013  . Abscess of left hip  10/04/2012  . Finger infection 06/09/2012  . Recurrent cellulitis of lower leg 10/12/2011  . Lower extremity venous stasis   . Reflux   . ALLERGIC RHINITIS 11/02/2007  . ERECTILE DYSFUNCTION 11/02/2007  . GENITAL HERPES 05/23/2007  . Chronic hepatitis C virus infection (HCC) 05/23/2007  . ANXIETY 05/23/2007  . HEROIN ABUSE 05/23/2007  . GERD 05/23/2007  . LOW BACK PAIN 05/23/2007    Past Surgical History:  Procedure Laterality Date  . CHOLECYSTECTOMY    . I & D EXTREMITY  06/09/2012   Procedure: IRRIGATION AND DEBRIDEMENT EXTREMITY;  Surgeon: Sharma Covert, MD;  [facility];  Service: Orthopedics;  Laterality: Left;  . LAPAROTOMY N/A 08/01/2016   Procedure: EXPLORATORY LAPAROTOMY;  Surgeon: Berna Bue, MD;  [facility];  Service: General;  Laterality: N/A;  . ORBITAL FRACTURE SURGERY    . ORIF TIBIA PLATEAU Right 11/14/2018   Procedure: OPEN REDUCTION INTERNAL FIXATION (ORIF) TIBIAL PLATEAU;  Surgeon: Roby Lofts, MD;  [facility];  Service: Orthopedics;  Laterality: Right;  . SPLENECTOMY, TOTAL N/A 08/01/2016   Procedure: SPLENECTOMY;  Surgeon: Violeta Gelinas, MD;  Location: [facility] OR;  Service: General;  Laterality: N/A;  . TONSILLECTOMY         Home Medications    Prior  to Admission medications   Medication Sig Start Date End Date Taking? Authorizing Provider  amLODipine (NORVASC) 2.5 MG tablet  05/05/19   [provider]  esomeprazole (NEXIUM) 20 MG capsule Take 1 capsule (20 mg total) by mouth daily at 12 noon. For acid reflux 11/16/18   Kathlen Mody, MD  feeding supplement, ENSURE ENLIVE, (ENSURE ENLIVE) LIQD Take 237 mLs by mouth 2 (two) times daily between meals. 11/16/18   Kathlen Mody, MD  folic acid (FOLVITE) 1 MG tablet Take 1 tablet (1 mg total) by mouth daily. 11/17/18   Kathlen Mody, MD  gabapentin (NEURONTIN) 300 MG capsule Take 300 mg by mouth 3 (three) times daily.    [provider]  lidocaine (LIDODERM) 5 % Place 1 patch onto  the skin daily. Remove & Discard patch within 12 hours or as directed by MD 10/10/19   Elvina Sidle, MD  LORazepam (ATIVAN) 0.5 MG tablet Take 0.5 mg by mouth 2 (two) times daily.    [provider]  methocarbamol (ROBAXIN) 500 MG tablet Take 1 tablet (500 mg total) by mouth every 6 (six) hours as needed for muscle spasms. 11/16/18   Kathlen Mody, MD  Multiple Vitamin (MULTIVITAMIN WITH MINERALS) TABS tablet Take 1 tablet by mouth daily. For low vitamin Patient not taking: Reported on 11/14/2018 10/09/13   Armandina Stammer I, NP  nicotine (NICODERM CQ - DOSED IN MG/24 HOURS) 21 mg/24hr patch Place 1 patch (21 mg total) onto the skin daily. 11/17/18   Kathlen Mody, MD  Oxycodone HCl 10 MG TABS  05/14/19   [provider]  thiamine 100 MG tablet Take 1 tablet (100 mg total) by mouth daily. 11/17/18   Kathlen Mody, MD  traZODone (DESYREL) 150 MG tablet Take 150 mg by mouth at bedtime. 07/24/17   [provider]    Family History Family History  Problem Relation Age of Onset  . Stroke Mother   . Heart disease Father     Social History Social History   Tobacco Use  . Smoking status: Former Smoker    Types: Cigarettes    Quit date: 04/15/2012    Years since quitting: 7.4  . Smokeless tobacco: Never Used  Substance Use Topics  . Alcohol use: Yes    Alcohol/week: 0.0 standard drinks    Comment: in rehab  . Drug use: No    Types: Heroin    Comment:  reports heroine use today 08/01/16     Allergies   Patient has no known allergies.   Review of Systems Review of Systems  Musculoskeletal: Positive for back pain.  All other systems reviewed and are negative.    Physical Exam Triage Vital Signs ED Triage Vitals  Enc Vitals Group     BP      Pulse      Resp      Temp      Temp src      SpO2      Weight      Height      Head Circumference      Peak Flow      Pain Score      Pain Loc      Pain Edu?      Excl. in GC?    No data found.  Updated Vital  Signs BP (!) 156/88 (BP Location: Left Arm)   Pulse 88   Temp 98.2 F (36.8 C) (Oral)   Resp 16   SpO2 99%  Physical Exam Vitals and nursing note reviewed.  Constitutional:      Appearance: Normal appearance. He is obese.  HENT:     Head: Normocephalic.  Cardiovascular:     Rate and Rhythm: Normal rate.  Pulmonary:     Effort: Pulmonary effort is normal.  Musculoskeletal:        General: Normal range of motion.     Cervical back: Normal range of motion and neck supple.     Comments: Wearing a brace.  No obvious injury.  Nontender back with no ecchymosis.  Good range of motion of back after removing brace.  No straight leg raising signs or numbness.  Skin:    General: Skin is warm.  Neurological:     General: No focal deficit present.     Mental Status: He is alert and oriented to person, place, and time.  Psychiatric:        Mood and Affect: Mood normal.        Thought Content: Thought content normal.      UC Treatments / Results  Labs (all labs ordered are listed, but only abnormal results are displayed) Labs Reviewed - No data to display  EKG   Radiology No results found.  Procedures Procedures (including critical care time)  Medications Ordered in UC Medications - No data to display  Initial Impression / Assessment and Plan / UC Course  I have reviewed the triage vital signs and the nursing notes.  Pertinent labs & imaging results that were available during my care of the patient were reviewed by me and considered in my medical decision making (see chart for details).    Final Clinical Impressions(s) / UC Diagnoses   Final diagnoses:  Midline low back pain without sciatica, unspecified chronicity  Fall, initial encounter  Polypharmacy   Discharge Instructions   None    ED Prescriptions    Medication Sig Dispense Auth. Provider   lidocaine (LIDODERM) 5 % Place 1 patch onto the skin daily. Remove & Discard patch within 12 hours or as directed  by MD 7 patch Robyn Haber, MD     PDMP not reviewed this encounter.   Robyn Haber, MD 10/10/19 1419

## 2019-10-15 ENCOUNTER — Other Ambulatory Visit: Payer: Self-pay

## 2019-10-15 ENCOUNTER — Ambulatory Visit: Payer: Medicare HMO | Attending: Student

## 2019-10-15 DIAGNOSIS — M25651 Stiffness of right hip, not elsewhere classified: Secondary | ICD-10-CM | POA: Diagnosis present

## 2019-10-15 DIAGNOSIS — M6281 Muscle weakness (generalized): Secondary | ICD-10-CM | POA: Insufficient documentation

## 2019-10-15 DIAGNOSIS — M79661 Pain in right lower leg: Secondary | ICD-10-CM | POA: Diagnosis present

## 2019-10-15 NOTE — Therapy (Signed)
[place], [place], [address] Phone: [phone]   Fax:  [phone]  Physical Therapy Treatment  Patient Details  Name: Bryan Powell MRN: [id number] Date of Birth: 1951/01/07 Referring Provider (PT): Katha Hamming, MD    Encounter Date: 10/15/2019  PT End of Session - 10/15/19 1154    Visit Number  3    Number of Visits  10    Date for PT Re-Evaluation  11/01/19    Authorization Type  [employer] MCR    PT Start Time  7169   pt late 17 min   PT Stop Time  1219    PT Time Calculation (min)  31 min    Activity Tolerance  Patient tolerated treatment well    Behavior During Therapy  [facility] for tasks assessed/performed       Past Medical History:  Diagnosis Date  . Alcohol abuse   . Anxiety   . Depression   . ED (erectile dysfunction)   . Genital herpes   . GERD (gastroesophageal reflux disease)   . Hep C w/o coma, chronic ([place])   . Heroin abuse ([place])   . Hypertension   . Lower extremity venous stasis   . Poor venous access    hx. of Heroin, Alcohol abuse "extreme difficult vein access"  . Reflux     Past Surgical History:  Procedure Laterality Date  . CHOLECYSTECTOMY    . I & D EXTREMITY  06/09/2012   Procedure: IRRIGATION AND DEBRIDEMENT EXTREMITY;  Surgeon: Linna Hoff, MD;  Location: [place];  Service: Orthopedics;  Laterality: Left;  . LAPAROTOMY N/A 08/01/2016   Procedure: EXPLORATORY LAPAROTOMY;  Surgeon: Clovis Riley, MD;  Location: [place];  Service: General;  Laterality: N/A;  . ORBITAL FRACTURE SURGERY    . ORIF TIBIA PLATEAU Right 11/14/2018   Procedure: OPEN REDUCTION INTERNAL FIXATION (ORIF) TIBIAL PLATEAU;  Surgeon: Shona Needles, MD;  Location: [place];  Service: Orthopedics;  Laterality: Right;  . SPLENECTOMY, TOTAL N/A 08/01/2016   Procedure: SPLENECTOMY;  Surgeon: Georganna Skeans, MD;  Location: [place];  Service: General;  Laterality: N/A;  . TONSILLECTOMY      There were no  vitals filed for this visit.  Subjective Assessment - 10/15/19 1150    Subjective  He reports  the sweling better with hose.    Back is alot better now.    Currently in Pain?  Yes    Pain Score  6     Pain Location  Knee    Pain Orientation  Right;Left    Pain Type  Chronic pain    Pain Onset  More than a month ago    Pain Frequency  Constant    Aggravating Factors   weight bearing         [facility] PT Assessment - 10/15/19 0001      Standardized Balance Assessment   Standardized Balance Assessment  Berg Balance Test      Berg Balance Test   Sit to Stand  Able to stand without using hands and stabilize independently    Standing Unsupported  Able to stand safely 2 minutes    Sitting with Back Unsupported but Feet Supported on Floor or Stool  Able to sit safely and securely 2 minutes    Stand to Sit  Sits safely with minimal use of hands    Transfers  Able to transfer safely, minor use of hands    Standing Unsupported with Eyes  Closed  Able to stand 10 seconds safely    Standing Unsupported with Feet Together  Able to place feet together independently and stand 1 minute safely    From Standing, Reach Forward with Outstretched Arm  Can reach confidently >25 cm (10")    From Standing Position, Pick up Object from Floor  Able to pick up shoe safely and easily    From Standing Position, Turn to Look Behind Over each Shoulder  Looks behind from both sides and weight shifts well   slightly less due to back scoliosis   Turn 360 Degrees  Able to turn 360 degrees safely in 4 seconds or less    Standing Unsupported, Alternately Place Feet on Step/Stool  Able to stand independently and safely and complete 8 steps in 20 seconds    Standing Unsupported, One Foot in Front  Able to plae foot ahead of the other independently and hold 30 seconds    Standing on One Leg  Tries to lift leg/unable to hold 3 seconds but remains standing independently    Total Score  52    Berg comment:  Discussede results  of BERG score with Bryan Powell and no need for device and narrow BOS is problematic for him.                   [facility] Adult PT Treatment/Exercise - 10/15/19 0001      Knee/Hip Exercises: Aerobic   Nustep  L4 5 min LE      Knee/Hip Exercises: Standing   Heel Raises  Both;20 reps    Hip Abduction  Right;Left;15 reps    Hip Extension  Right;Left;15 reps      Knee/Hip Exercises: Seated   Long Arc Quad  Right;Left;20 reps    Long Arc Quad Weight  3 lbs.    Long Arc Quad Limitations  5 sec hold             PT Education - 10/15/19 1312    Education Details  Resutts of BERG,    Benefits of being on time for PT    Person(s) Educated  Patient    Methods  Explanation    Comprehension  Verbalized understanding          PT Long Term Goals - 10/15/19 1326      PT LONG TERM GOAL #1   Title  He will be independent with all HEp issued    Status  On-going      PT LONG TERM GOAL #2   Title  He will repeot able to get in and out of car with min effort.    Status  On-going      PT LONG TERM GOAL #3   Title  He will report pain in RT leg decreased 25% or more to 3/10 max    Time  5    Period  Weeks    Status  New      PT LONG TERM GOAL #4   Title  Pt will improve Rt hip  strength to overall 4+/5 MMT to improve balance and activity tolerance    Baseline  4 to 4+    Time  5    Period  Weeks    Status  New            Plan - 10/15/19 1155    Clinical Impression Statement  Bryan Powell  scored well on  BERG with no need for device. Balance in narrow BOS poor.  Discussed benefit of being on time.  His latness ha limited exercise and issuance of HEP    PT Treatment/Interventions  Patient/family education;Manual techniques;Therapeutic exercise;Passive range of motion;Balance training    PT Next Visit Plan  HEP, strength, TUG    Consulted and Agree with Plan of Care  Patient       Patient will benefit from skilled therapeutic intervention in order to improve  the following deficits and impairments:  Postural dysfunction, Decreased strength, Decreased range of motion, Difficulty walking, Decreased activity tolerance  Visit Diagnosis: Muscle weakness (generalized)  Stiffness of right hip, not elsewhere classified  Pain in right lower leg     Problem List Patient Active Problem List   Diagnosis Date Noted  . Fracture of tibial shaft, right, closed 11/22/2018  . Closed displaced fracture of right tibial spine 11/22/2018  . Closed bicondylar fracture of right tibial plateau 11/14/2018  . Polysubstance abuse (HCC) 11/14/2018  . Closed fracture of upper end of right fibula   . Poor venous access 03/07/2018  . 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 Bilateral lower leg cellulitis   . Venous insufficiency of both lower extremities   . Bilateral lower extremity edema   . Varicose veins of lower extremities with ulcer (HCC)   . Severe opioid use disorder (HCC) 10/05/2013  . Abscess of left hip 10/04/2012  . Finger infection 06/09/2012  . Recurrent cellulitis of lower leg 10/12/2011  . Lower extremity venous stasis   . Reflux   . ALLERGIC RHINITIS 11/02/2007  . ERECTILE DYSFUNCTION 11/02/2007  . GENITAL HERPES 05/23/2007  . Chronic hepatitis C virus infection (HCC) 05/23/2007  . ANXIETY 05/23/2007  . HEROIN ABUSE 05/23/2007  . GERD 05/23/2007  . LOW BACK PAIN 05/23/2007    Caprice Red  PT 10/15/2019, 1:28 PM  [facility] [address] [place], [place], [address] Phone: [phone]   Fax:  [phone]  Name: Bryan Powell MRN: [id number] Date of Birth: 1951-02-21

## 2019-10-17 ENCOUNTER — Ambulatory Visit: Payer: Medicare HMO

## 2019-10-22 ENCOUNTER — Ambulatory Visit: Payer: Medicare HMO

## 2019-10-23 ENCOUNTER — Ambulatory Visit: Payer: Medicare HMO | Admitting: Vascular Surgery

## 2019-10-24 ENCOUNTER — Ambulatory Visit: Payer: Medicare HMO

## 2019-11-21 ENCOUNTER — Other Ambulatory Visit: Payer: Self-pay | Admitting: Internal Medicine

## 2019-11-21 DIAGNOSIS — F17209 Nicotine dependence, unspecified, with unspecified nicotine-induced disorders: Secondary | ICD-10-CM

## 2019-12-04 ENCOUNTER — Encounter: Payer: Self-pay | Admitting: Nurse Practitioner

## 2019-12-09 ENCOUNTER — Other Ambulatory Visit: Payer: Self-pay

## 2019-12-09 ENCOUNTER — Ambulatory Visit
Admission: RE | Admit: 2019-12-09 | Discharge: 2019-12-09 | Disposition: A | Discharge status: Home / Self Care | Payer: Medicare HMO | Source: Ambulatory Visit | Attending: Internal Medicine | Admitting: Internal Medicine

## 2019-12-09 DIAGNOSIS — F17209 Nicotine dependence, unspecified, with unspecified nicotine-induced disorders: Secondary | ICD-10-CM

## 2020-01-10 ENCOUNTER — Ambulatory Visit: Payer: Medicare HMO | Admitting: Nurse Practitioner

## 2020-01-29 ENCOUNTER — Other Ambulatory Visit: Payer: Self-pay

## 2020-01-29 ENCOUNTER — Ambulatory Visit (INDEPENDENT_AMBULATORY_CARE_PROVIDER_SITE_OTHER): Payer: Medicare HMO | Admitting: Vascular Surgery

## 2020-01-29 ENCOUNTER — Encounter: Payer: Self-pay | Admitting: Vascular Surgery

## 2020-01-29 VITALS — BP 140/82 | HR 82 | Temp 97.9°F | Resp 18 | Ht 71.0 in | Wt 203.8 lb

## 2020-01-29 DIAGNOSIS — I83813 Varicose veins of bilateral lower extremities with pain: Secondary | ICD-10-CM | POA: Diagnosis not present

## 2020-01-29 DIAGNOSIS — M7989 Other specified soft tissue disorders: Secondary | ICD-10-CM

## 2020-01-29 NOTE — Progress Notes (Signed)
Patient name: Bryan Powell MRN: [id number] DOB: 02-23-1951 Sex: male  HPI: Bryan Powell is a 69 y.o. male, seen in our APP clinic about 8 months ago for bilateral leg swelling.  Patient has had several episodes of cellulitis in his right leg in the past.  Episode was about 10 years ago.  He has been wearing lower extremity turned 20 to 30 mm thigh-high compression stockings with some improvement in his symptoms.  However he states he easily forms ulcers on the skin and had a very difficult time healing that wounds on his right leg with a recent ulceration.  He now has a new ulceration just above the ankle in his left leg.  He has no prior history of DVT.  He has had an orthopedic procedure and fracture in the right leg in the past.  He does not really have any family history of vein disorders.  His primary complaint of the skin changes in his legs please see images from [facility]hyne's note December 28 as well as slow healing wounds.  Other medical problems include hypertension and hepatitis C.  Both are currently stable.  Past Medical History:  Diagnosis Date   Alcohol abuse    Anxiety    Depression    ED (erectile dysfunction)    Genital herpes    GERD (gastroesophageal reflux disease)    Hep C w/o coma, chronic (HCC)    Heroin abuse (HCC)    Hypertension    Lower extremity venous stasis    Poor venous access    hx. of Heroin, Alcohol abuse "extreme difficult vein access"   Reflux    Past Surgical History:  Procedure Laterality Date   CHOLECYSTECTOMY     I & D EXTREMITY  06/09/2012   Procedure: IRRIGATION AND DEBRIDEMENT EXTREMITY;  Surgeon: Sharma Covert, MD;  [facility];  Service: Orthopedics;  Laterality: Left;   LAPAROTOMY N/A 08/01/2016   Procedure: EXPLORATORY LAPAROTOMY;  Surgeon: Berna Bue, MD;  [facility];  Service: General;  Laterality: N/A;   ORBITAL FRACTURE SURGERY     ORIF TIBIA PLATEAU Right 11/14/2018   Procedure: OPEN  REDUCTION INTERNAL FIXATION (ORIF) TIBIAL PLATEAU;  Surgeon: Roby Lofts, MD;  [facility];  Service: Orthopedics;  Laterality: Right;   SPLENECTOMY, TOTAL N/A 08/01/2016   Procedure: SPLENECTOMY;  Surgeon: Violeta Gelinas, MD;  Location: [facility] OR;  Service: General;  Laterality: N/A;   TONSILLECTOMY      Family History  Problem Relation Age of Onset   Stroke Mother    Heart disease Father     SOCIAL HISTORY: Social History   Socioeconomic History   Marital status: Married    Spouse name: Not on file   Number of children: Not on file   Years of education: Not on file   Highest education level: Not on file  Occupational History   Not on file  Tobacco Use   Smoking status: Former Smoker    Types: Cigarettes    Quit date: 04/15/2012    Years since quitting: 7.7   Smokeless tobacco: Never Used  Vaping Use   Vaping Use: Never used  Substance and Sexual Activity   Alcohol use: Yes    Alcohol/week: 0.0 standard drinks    Comment: in rehab   Drug use: No    Types: Heroin    Comment:  reports heroine use today 08/01/16   Sexual activity: Not on file  Other Topics Concern   Not on  file  Social History Narrative   Not on file   Social Determinants of Health   Financial Resource Strain:    Difficulty of Paying Living Expenses:   Food Insecurity:    Worried About Programme researcher, broadcasting/film/video in the Last Year:    Barista in the Last Year:   Transportation Needs:    Freight forwarder (Medical):    Lack of Transportation (Non-Medical):   Physical Activity:    Days of Exercise per Week:    Minutes of Exercise per Session:   Stress:    Feeling of Stress :   Social Connections:    Frequency of Communication with Friends and Family:    Frequency of Social Gatherings with Friends and Family:    Attends Religious Services:    Active Member of Clubs or Organizations:    Attends Engineer, structural:    Marital Status:   Intimate  Partner Violence:    Fear of Current or Ex-Partner:    Emotionally Abused:    Physically Abused:    Sexually Abused:     No Known Allergies  Current Outpatient Medications  Medication Sig Dispense Refill   amLODipine (NORVASC) 2.5 MG tablet      esomeprazole (NEXIUM) 20 MG capsule Take 1 capsule (20 mg total) by mouth daily at 12 noon. For acid reflux 30 capsule 0   feeding supplement, ENSURE ENLIVE, (ENSURE ENLIVE) LIQD Take 237 mLs by mouth 2 (two) times daily between meals. 237 mL 12   folic acid (FOLVITE) 1 MG tablet Take 1 tablet (1 mg total) by mouth daily. 30 tablet 0   gabapentin (NEURONTIN) 300 MG capsule Take 300 mg by mouth 3 (three) times daily.     lidocaine (LIDODERM) 5 % Place 1 patch onto the skin daily. Remove & Discard patch within 12 hours or as directed by MD 7 patch 0   LORazepam (ATIVAN) 0.5 MG tablet Take 0.5 mg by mouth 2 (two) times daily.     methocarbamol (ROBAXIN) 500 MG tablet Take 1 tablet (500 mg total) by mouth every 6 (six) hours as needed for muscle spasms. 10 tablet 0   Multiple Vitamin (MULTIVITAMIN WITH MINERALS) TABS tablet Take 1 tablet by mouth daily. For low vitamin     nicotine (NICODERM CQ - DOSED IN MG/24 HOURS) 21 mg/24hr patch Place 1 patch (21 mg total) onto the skin daily. 28 patch 0   Oxycodone HCl 10 MG TABS      thiamine 100 MG tablet Take 1 tablet (100 mg total) by mouth daily. 30 tablet 0   traZODone (DESYREL) 150 MG tablet Take 150 mg by mouth at bedtime.     No current facility-administered medications for this visit.    ROS:   General:  No weight loss, Fever, chills  HEENT: No recent headaches, no nasal bleeding, no visual changes, no sore throat  Neurologic: No dizziness, blackouts, seizures. No recent symptoms of stroke or mini- stroke. No recent episodes of slurred speech, or temporary blindness.  Cardiac: No recent episodes of chest pain/pressure, no shortness of breath at rest.  No shortness of breath  with exertion.  Denies history of atrial fibrillation or irregular heartbeat  Vascular: No history of rest pain in feet.  No history of claudication.  + history of non-healing ulcer, No history of DVT   Pulmonary: No home oxygen, no productive cough, no hemoptysis,  No asthma or wheezing  Musculoskeletal:  [ ]  Arthritis, [ ]   Low back pain,  [ ]  Joint pain  Hematologic:No history of hypercoagulable state.  No history of easy bleeding.  No history of anemia  Gastrointestinal: No hematochezia or melena,  No gastroesophageal reflux, no trouble swallowing  Urinary: [ ]  chronic Kidney disease, [ ]  on HD - [ ]  MWF or [ ]  TTHS, [ ]  Burning with urination, [ ]  Frequent urination, [ ]  Difficulty urinating;   Skin: No rashes  Psychological: No history of anxiety,  No history of depression   Physical Examination  Vitals:   01/29/20 1345  BP: 140/82  Pulse: 82  Resp: 18  Temp: 97.9 F (36.6 C)  TempSrc: Temporal  SpO2: 97%  Weight: 203 lb 12.8 oz (92.4 kg)  Height: 5\' 11"  (1.803 m)    Body mass index is 28.42 kg/m.  General:  Alert and oriented, no acute distress HEENT: Normal Neck: No JVD Pulmonary: Clear to auscultation bilaterally Cardiac: Regular Rate and Rhythm without murmur Abdomen: Soft, non-tender, non-distended, no mass, no scars Skin: No rash Extremity Pulses:  2+ radial, brachial, femoral, dorsalis pedis, posterior tibial pulses bilaterally Musculoskeletal: No deformity or edema  Neurologic: Upper and lower extremity motor 5/5 and symmetric  DATA:  Patient had a venous reflux exam December 2000 which showed diffuse deep vein reflux.  He also had reflux in the right greater saphenous vein but the vein was fairly small diameter about 2 mm throughout most of its course.  The right lesser saphenous vein was about 3 mm.  Was also diffuse reflux in this.  In the left lower extremity there was diffuse deep vein reflux as well as within the left greater saphenous vein.  This  vein diameter was 4 to 5 mm.  I repeated portions of the venous reflux exam with the SonoSite at the bedside today which confirmed a vein diameter on the left side was fairly straight in its course with about 4 to 5 mm diameter.  ASSESSMENT: Symptomatic venous insufficiency primarily deep vein reflux on the right leg.  However he has a combination of superficial and deep venous reflux on the left side and a dilated left greater saphenous vein and I believe he would at least improve slowly his symptoms in the left leg by laser ablation of the left greater saphenous vein.   PLAN: Patient will continue to wear his lower extremity compression stockings.  He was given a new pair of stockings today thigh-high 20 to 30 mm.  He has been compliant wearing these since December.  It was emphasized to him that his skin is going to be very fragile he will need to wear these lifetime.  I also discussed with him laser ablation of the left greater saphenous vein to for improvement of some of the swelling symptoms and decrease in offload pressure on the skin on his left leg to prevent ulceration.  Risk benefits possible complications of procedure details including not limited to bleeding infection deep vein thrombosis (1 and 100,000) were discussed with the patient today.  We will discuss with him further scheduling if he wishes to proceed after prior insurance approval.   , MD [facility] Office: [phone]

## 2020-03-02 ENCOUNTER — Encounter: Payer: Self-pay | Admitting: General Surgery

## 2020-03-03 ENCOUNTER — Encounter: Payer: Self-pay | Admitting: Nurse Practitioner

## 2020-03-03 ENCOUNTER — Ambulatory Visit (INDEPENDENT_AMBULATORY_CARE_PROVIDER_SITE_OTHER): Payer: Medicare HMO | Admitting: Nurse Practitioner

## 2020-03-03 VITALS — BP 128/56 | HR 82 | Ht 70.0 in | Wt 205.0 lb

## 2020-03-03 DIAGNOSIS — K219 Gastro-esophageal reflux disease without esophagitis: Secondary | ICD-10-CM | POA: Diagnosis not present

## 2020-03-03 DIAGNOSIS — Z1211 Encounter for screening for malignant neoplasm of colon: Secondary | ICD-10-CM

## 2020-03-03 NOTE — Patient Instructions (Signed)
If you are age 70 or older, your body mass index should be between 23-30. Your Body mass index is 29.41 kg/m. If this is out of the aforementioned range listed, please consider follow up with your Primary Care Provider.  If you are age 53 or younger, your body mass index should be between 19-25. Your Body mass index is 29.41 kg/m. If this is out of the aformentioned range listed, please consider follow up with your Primary Care Provider.   We will need to arrange for IV access in order to do your procedure. You will be called to schedule at a later date.  1. Take nexium twice a day before meals. 2. Stop smoking 3. Decrease alcohol- no more than one shot daily.   Gastroesophageal Reflux Disease, Adult Gastroesophageal reflux (GER) happens when acid from the stomach flows up into the tube that connects the mouth and the stomach (esophagus). Normally, food travels down the esophagus and stays in the stomach to be digested. With GER, food and stomach acid sometimes move back up into the esophagus. You may have a disease called gastroesophageal reflux disease (GERD) if the reflux:  Happens often.  Causes frequent or very bad symptoms.  Causes problems such as damage to the esophagus. When this happens, the esophagus becomes sore and swollen (inflamed). Over time, GERD can make small holes (ulcers) in the lining of the esophagus. What are the causes? This condition is caused by a problem with the muscle between the esophagus and the stomach. When this muscle is weak or not normal, it does not close properly to keep food and acid from coming back up from the stomach. The muscle can be weak because of:  Tobacco use.  Pregnancy.  Having a certain type of hernia (hiatal hernia).  Alcohol use.  Certain foods and drinks, such as coffee, chocolate, onions, and peppermint. What increases the risk? You are more likely to develop this condition if you:  Are overweight.  Have a disease that  affects your connective tissue.  Use NSAID medicines. What are the signs or symptoms? Symptoms of this condition include:  Heartburn.  Difficult or painful swallowing.  The feeling of having a lump in the throat.  A bitter taste in the mouth.  Bad breath.  Having a lot of saliva.  Having an upset or bloated stomach.  Belching.  Chest pain. Different conditions can cause chest pain. Make sure you see your doctor if you have chest pain.  Shortness of breath or noisy breathing (wheezing).  Ongoing (chronic) cough or a cough at night.  Wearing away of the surface of teeth (tooth enamel).  Weight loss. How is this treated? Treatment will depend on how bad your symptoms are. Your doctor may suggest:  Changes to your diet.  Medicine.  Surgery. Follow these instructions at home: Eating and drinking   Follow a diet as told by your doctor. You may need to avoid foods and drinks such as: ? Coffee and tea (with or without caffeine). ? Drinks that contain alcohol. ? Energy drinks and sports drinks. ? Bubbly (carbonated) drinks or sodas. ? Chocolate and cocoa. ? Peppermint and mint flavorings. ? Garlic and onions. ? Horseradish. ? Spicy and acidic foods. These include peppers, chili powder, curry powder, vinegar, hot sauces, and BBQ sauce. ? Citrus fruit juices and citrus fruits, such as oranges, lemons, and limes. ? Tomato-based foods. These include red sauce, chili, salsa, and pizza with red sauce. ? Fried and fatty foods. These include  donuts, french fries, potato chips, and high-fat dressings. ? High-fat meats. These include hot dogs, rib eye steak, sausage, ham, and bacon. ? High-fat dairy items, such as whole milk, butter, and cream cheese.  Eat small meals often. Avoid eating large meals.  Avoid drinking large amounts of liquid with your meals.  Avoid eating meals during the 2-3 hours before bedtime.  Avoid lying down right after you eat.  Do not exercise  right after you eat. Lifestyle   Do not use any products that contain nicotine or tobacco. These include cigarettes, e-cigarettes, and chewing tobacco. If you need help quitting, ask your doctor.  Try to lower your stress. If you need help doing this, ask your doctor.  If you are overweight, lose an amount of weight that is healthy for you. Ask your doctor about a safe weight loss goal. General instructions  Pay attention to any changes in your symptoms.  Take over-the-counter and prescription medicines only as told by your doctor. Do not take aspirin, ibuprofen, or other NSAIDs unless your doctor says it is okay.  Wear loose clothes. Do not wear anything tight around your waist.  Raise (elevate) the head of your bed about 6 inches (15 cm).  Avoid bending over if this makes your symptoms worse.  Keep all follow-up visits as told by your doctor. This is important. Contact a doctor if:  You have new symptoms.  You lose weight and you do not know why.  You have trouble swallowing or it hurts to swallow.  You have wheezing or a cough that keeps happening.  Your symptoms do not get better with treatment.  You have a hoarse voice. Get help right away if:  You have pain in your arms, neck, jaw, teeth, or back.  You feel sweaty, dizzy, or light-headed.  You have chest pain or shortness of breath.  You throw up (vomit) and your throw-up looks like blood or coffee grounds.  You pass out (faint).  Your poop (stool) is bloody or black.  You cannot swallow, drink, or eat. Summary  If a person has gastroesophageal reflux disease (GERD), food and stomach acid move back up into the esophagus and cause symptoms or problems such as damage to the esophagus.  Treatment will depend on how bad your symptoms are.  Follow a diet as told by your doctor.  Take all medicines only as told by your doctor. This information is not intended to replace advice given to you by your health  care provider. Make sure you discuss any questions you have with your health care provider. Document Revised: 12/06/2017 Document Reviewed: 12/06/2017 Elsevier Patient Education  2020 [employer].  Food Choices for Gastroesophageal Reflux Disease, Adult When you have gastroesophageal reflux disease (GERD), the foods you eat and your eating habits are very important. Choosing the right foods can help ease your discomfort. Think about working with a nutrition specialist (dietitian) to help you make good choices. What are tips for following this plan?  Meals  Choose healthy foods that are low in fat, such as fruits, vegetables, whole grains, low-fat dairy products, and lean meat, fish, and poultry.  Eat small meals often instead of 3 large meals a day. Eat your meals slowly, and in a place where you are relaxed. Avoid bending over or lying down until 2-3 hours after eating.  Avoid eating meals 2-3 hours before bed.  Avoid drinking a lot of liquid with meals.  Cook foods using methods other than frying. Bake,  grill, or broil food instead.  Avoid or limit: ? Chocolate. ? Peppermint or spearmint. ? Alcohol. ? Pepper. ? Black and decaffeinated coffee. ? Black and decaffeinated tea. ? Bubbly (carbonated) soft drinks. ? Caffeinated energy drinks and soft drinks.  Limit high-fat foods such as: ? Fatty meat or fried foods. ? Whole milk, cream, butter, or ice cream. ? Nuts and nut butters. ? Pastries, donuts, and sweets made with butter or shortening.  Avoid foods that cause symptoms. These foods may be different for everyone. Common foods that cause symptoms include: ? Tomatoes. ? Oranges, lemons, and limes. ? Peppers. ? Spicy food. ? Onions and garlic. ? Vinegar. Lifestyle  Maintain a healthy weight. Ask your doctor what weight is healthy for you. If you need to lose weight, work with your doctor to do so safely.  Exercise for at least 30 minutes for 5 or more days each week,  or as told by your doctor.  Wear loose-fitting clothes.  Do not smoke. If you need help quitting, ask your doctor.  Sleep with the head of your bed higher than your feet. Use a wedge under the mattress or blocks under the bed frame to raise the head of the bed. Summary  When you have gastroesophageal reflux disease (GERD), food and lifestyle choices are very important in easing your symptoms.  Eat small meals often instead of 3 large meals a day. Eat your meals slowly, and in a place where you are relaxed.  Limit high-fat foods such as fatty meat or fried foods.  Avoid bending over or lying down until 2-3 hours after eating.  Avoid peppermint and spearmint, caffeine, alcohol, and chocolate. This information is not intended to replace advice given to you by your health care provider. Make sure you discuss any questions you have with your health care provider. Document Revised: 09/20/2018 Document Reviewed: 07/05/2016 Elsevier Patient Education  [employer].   Due to recent changes in healthcare laws, you may see the results of your imaging and laboratory studies on MyChart before your provider has had a chance to review them.  We understand that in some cases there may be results that are confusing or concerning to you. Not all laboratory results come back in the same time frame and the provider may be waiting for multiple results in order to interpret others.  Please give [place] 48 hours in order for your provider to thoroughly review all the results before contacting the office for clarification of your results.

## 2020-03-03 NOTE — Progress Notes (Addendum)
ASSESSMENT AND PLAN    # GERD, breakthrough pyrosis on BID Omeprazole --There is room for lifestyle improvement. We discussed going to bed on an empty stomach, discontinuation of snacking during the night, smoking cessation, reduction of Etoh, elevating HOH.  --Encouraged patient to work on above interventions which may help. In the meantime It is reasonable to proceed with an EGD to evaluate breakthrough GERD symptoms as patient is in need of screening colonoscopy as well. The risks and benefits of EGD and colonoscopy with possible polypectomy / biopsies were discussed and the patient agrees to proceed.  --He has poor venous access so will need to discuss with Dr. Leone Payor to see how to proceed. Another option would be Cologuard and then wait and see how GERD symptoms respond to above interventions.   # Colon cancer screening --Not done in 2019 due to poor venous access. Will work on details to get him scheduled.  --No alarm features such as blood in stool or anemia.   # Etoh abuse --Drinks 3-4 shots of liquor throughout the day.  --Discussed adverse effects of that much etoh. He will try to reduce amount to no more than 1 shot a day. LFTs normal in June 2021.    ADDENDUM:  03/16/20 Call patient to get an update as we are waiting to schedule procedures at hospital with IR placing IV line just prior. He has had some improvement in GERD symptoms with anti-reflux measures. At this point he may just need a screening colonoscopy, no EGD. There are hospital procedure scheduling delays with COVID pandemic. Additionally Dr. Leone Payor was recently out on medical leave. Will reach out to the patient when able to proceed with procedure. At that time if GERD symptoms haven't continued to improve then consider adding on EGD  HISTORY OF PRESENT ILLNESS     Primary Gastroenterologist :Stan Head, MD  Chief Complaint : Heartburn  Bryan Powell is a 69 y.o. male with PMH / PSH significant for,   but not necessarily limited to: GERD, pre-diabetes, HCV ( treated), polysubstance abuse,  tobacco abuse,Etoh abuse, lower extremity cellulitis, chronic low back pain, cholecystectomy, splenectomy  Patient saw Dr. Arlyce Dice 10 year ago. He hasn't been since though it appears he was scheduled for a colonoscopy in 2019 which could be done due to poor venous access. Plan was for him to return to clinic to discuss options but that didn't happen.   Patient is referred by PCP for GERD symptoms. Patient says he has taken twice daily omeprazole for years.  Despite PPI therapy still gets heartburn 3-4 times a week.  Heartburn sometimes postprandial, sometimes nocturnal though he does tend to wake up in the middle of the night and snack.  He sleeps on 4 pillows.  His weight has been stable over last several months though overall he is up ~ 40 pounds over last 3 years.  Patient drinks a couple of caffeinated sodas a day.  He smokes tobacco.  He consumes 4-5 shots of liquor during the course of the day. Liver tests in June were normal.    Data Reviewed: PCP labs 11/14/19 CBC normal Hgb 13.8, platelets 166 LFTs normal Cr 0.6 A1c 6.2 Chol 122, trig 81  Past Medical History:  Diagnosis Date  . Alcohol abuse   . Anxiety   . Depression   . ED (erectile dysfunction)   . Genital herpes   . GERD (gastroesophageal reflux disease)   . Hep C w/o coma, chronic (HCC)   .  Heroin abuse (HCC)   . Hypertension   . Lower extremity venous stasis   . Poor venous access    hx. of Heroin, Alcohol abuse "extreme difficult vein access"  . Reflux      Past Surgical History:  Procedure Laterality Date  . CHOLECYSTECTOMY    . I & D EXTREMITY  06/09/2012   Procedure: IRRIGATION AND DEBRIDEMENT EXTREMITY;  Surgeon: Sharma Covert, MD;  Location: [facility] OR;  Service: Orthopedics;  Laterality: Left;  . LAPAROTOMY N/A 08/01/2016   Procedure: EXPLORATORY LAPAROTOMY;  Surgeon: Berna Bue, MD;  Location: [facility] OR;  Service:  General;  Laterality: N/A;  . ORBITAL FRACTURE SURGERY    . ORIF TIBIA PLATEAU Right 11/14/2018   Procedure: OPEN REDUCTION INTERNAL FIXATION (ORIF) TIBIAL PLATEAU;  Surgeon: Roby Lofts, MD;  Location: [facility] OR;  Service: Orthopedics;  Laterality: Right;  . SPLENECTOMY, TOTAL N/A 08/01/2016   Procedure: SPLENECTOMY;  Surgeon: Violeta Gelinas, MD;  Location: [facility];  Service: General;  Laterality: N/A;  . TONSILLECTOMY     Family History  Problem Relation Age of Onset  . Stroke Mother   . Heart disease Father   . Colon cancer Neg Hx   . Stomach cancer Neg Hx   . Esophageal cancer Neg Hx   . Pancreatic cancer Neg Hx    Social History   Tobacco Use  . Smoking status: Current Every Day Smoker    Types: Cigarettes    Last attempt to quit: 04/15/2012    Years since quitting: 7.8  . Smokeless tobacco: Never Used  Vaping Use  . Vaping Use: Never used  Substance Use Topics  . Alcohol use: Yes    Alcohol/week: 0.0 standard drinks    Comment: per pt 4-5 shots a week   . Drug use: No    Types: Heroin    Comment:  reports heroine use today 08/01/16   Current Outpatient Medications  Medication Sig Dispense Refill  . amLODipine (NORVASC) 2.5 MG tablet     . esomeprazole (NEXIUM) 20 MG capsule Take 1 capsule (20 mg total) by mouth daily at 12 noon. For acid reflux 30 capsule 0  . feeding supplement, ENSURE ENLIVE, (ENSURE ENLIVE) LIQD Take 237 mLs by mouth 2 (two) times daily between meals. 237 mL 12  . folic acid (FOLVITE) 1 MG tablet Take 1 tablet (1 mg total) by mouth daily. 30 tablet 0  . gabapentin (NEURONTIN) 300 MG capsule Take 300 mg by mouth 3 (three) times daily.    Marland Kitchen lidocaine (LIDODERM) 5 % Place 1 patch onto the skin daily. Remove & Discard patch within 12 hours or as directed by MD 7 patch 0  . LORazepam (ATIVAN) 0.5 MG tablet Take 0.5 mg by mouth 2 (two) times daily.    . methocarbamol (ROBAXIN) 500 MG tablet Take 1 tablet (500 mg total) by mouth every 6 (six) hours as needed  for muscle spasms. 10 tablet 0  . Multiple Vitamin (MULTIVITAMIN WITH MINERALS) TABS tablet Take 1 tablet by mouth daily. For low vitamin    . nicotine (NICODERM CQ - DOSED IN MG/24 HOURS) 21 mg/24hr patch Place 1 patch (21 mg total) onto the skin daily. 28 patch 0  . Oxycodone HCl 10 MG TABS     . thiamine 100 MG tablet Take 1 tablet (100 mg total) by mouth daily. 30 tablet 0  . traZODone (DESYREL) 150 MG tablet Take 150 mg by mouth at bedtime.     No  current facility-administered medications for this visit.   No Known Allergies   Review of Systems:  All other systems reviewed and negative except where noted in HPI.   PHYSICAL EXAM :    Wt Readings from Last 3 Encounters:  03/03/20 205 lb (93 kg)  01/29/20 203 lb 12.8 oz (92.4 kg)  06/10/19 202 lb 14.4 oz (92 kg)    BP (!) 128/56   Pulse 82   Ht 5\' 10"  (1.778 m)   Wt 205 lb (93 kg)   BMI 29.41 kg/m  Constitutional:  Pleasant male in no acute distress. Psychiatric: Normal mood and affect. Behavior is normal. EENT: Pupils normal.  Conjunctivae are normal. No scleral icterus. Neck supple.  Cardiovascular: Normal rate, regular rhythm. No edema Pulmonary/chest: Effort normal and breath sounds normal. No wheezing, rales or rhonchi. Abdominal: Soft, nondistended, nontender. Bowel sounds active throughout. There are no masses palpable. No hepatomegaly.Large midline surgical scar Neurological: Alert and oriented to person place and time. Skin: Skin is warm and dry. No rashes noted.  , NP  03/03/2020, 11:54 AM  Cc:  [facility] [place] *  03/05/2020, MD

## 2020-03-25 ENCOUNTER — Telehealth: Payer: Self-pay

## 2020-03-25 NOTE — Telephone Encounter (Signed)
Called the patient. No answer. Left a message asking he return my call about scheduling his procedures.

## 2020-03-25 NOTE — Telephone Encounter (Signed)
-----   Message from Meredith Pel, NP sent at 03/18/2020  3:59 PM EDT ----- Would book for a double. Let patient know that if upper GI symptoms better Dr. Leone Payor may not need to do the upper. Thanks ----- Message ----- From: Evalee Jefferson, LPN Sent: [id number]   2:21 PM EDT To: Meredith Pel, NP  Leone Payor is on the schedule as having 06/09/20 for hospital procedures. Should I book the patient for then? It can be booked as a double, then changed if he does not need the EGD. But to book for a colon and try to add an EGD will not work.  Your thoughts?  ----- Message ----- From: Meredith Pel, NP Sent: 03/16/2020   1:47 PM EDT To: Evalee Jefferson, LPN  Beth, please keep this patient on your list to check on at some point. He will eventually need a screening colonoscopy at hospital with IR placing IV access just prior. At the time that we schedule colonoscopy please make sure patient's GERD symptoms are under control, if not then make it a double with Leone Payor. I can give you more details later. Thanks, PG

## 2020-03-26 NOTE — Telephone Encounter (Signed)
Patient is returning your call.  

## 2020-03-26 NOTE — Telephone Encounter (Signed)
Gunnar Fusi I spoke with Mr Royster. Very nice man. He reports improvement in his upper GI symptoms. He is hesitant about scheduling the screening colonoscopy. He agrees to reconsider and will call me with his decision. I am keeping this note open to make it easier to follow up. Please route it back to me. Thanks

## 2020-03-27 NOTE — Telephone Encounter (Signed)
Okay, thanks for keeping track of this. Glad upper symptoms better.

## 2020-04-17 NOTE — Telephone Encounter (Signed)
Called the patient.No answer. Left a message asking he return my call. I am wanting to know if he has had any further upper GI issues. Also, ask if he would like to discuss colon cancer screening options.

## 2020-07-06 ENCOUNTER — Ambulatory Visit: Payer: Medicare HMO | Admitting: Internal Medicine

## 2021-08-10 ENCOUNTER — Emergency Department (HOSPITAL_COMMUNITY)
Admission: EM | Admit: 2021-08-10 | Discharge: 2021-08-10 | Disposition: A | Discharge status: Home / Self Care | Payer: Medicare HMO | Attending: Emergency Medicine | Admitting: Emergency Medicine

## 2021-08-10 DIAGNOSIS — T40695A Adverse effect of other narcotics, initial encounter: Secondary | ICD-10-CM | POA: Diagnosis not present

## 2021-08-10 DIAGNOSIS — Z79899 Other long term (current) drug therapy: Secondary | ICD-10-CM | POA: Diagnosis not present

## 2021-08-10 DIAGNOSIS — T401X1A Poisoning by heroin, accidental (unintentional), initial encounter: Secondary | ICD-10-CM | POA: Diagnosis not present

## 2021-08-10 DIAGNOSIS — G894 Chronic pain syndrome: Secondary | ICD-10-CM

## 2021-08-10 LAB — CBC WITH DIFFERENTIAL/PLATELET
Abs Immature Granulocytes: 0.02 10*3/uL (ref 0.00–0.07)
Basophils Absolute: 0 10*3/uL (ref 0.0–0.1)
Basophils Relative: 1 %
Eosinophils Absolute: 0.1 10*3/uL (ref 0.0–0.5)
Eosinophils Relative: 1 %
HCT: 43.1 % (ref 39.0–52.0)
Hemoglobin: 14 g/dL (ref 13.0–17.0)
Immature Granulocytes: 0 %
Lymphocytes Relative: 64 %
Lymphs Abs: 5.2 10*3/uL — ABNORMAL HIGH (ref 0.7–4.0)
MCH: 26.4 pg (ref 26.0–34.0)
MCHC: 32.5 g/dL (ref 30.0–36.0)
MCV: 81.3 fL (ref 80.0–100.0)
Monocytes Absolute: 0.9 10*3/uL (ref 0.1–1.0)
Monocytes Relative: 12 %
Neutro Abs: 1.8 10*3/uL (ref 1.7–7.7)
Neutrophils Relative %: 22 %
Platelets: 348 10*3/uL (ref 150–400)
RBC: 5.3 MIL/uL (ref 4.22–5.81)
RDW: 17 % — ABNORMAL HIGH (ref 11.5–15.5)
WBC: 8.1 10*3/uL (ref 4.0–10.5)
nRBC: 0 % (ref 0.0–0.2)

## 2021-08-10 LAB — ETHANOL: Alcohol, Ethyl (B): 75 mg/dL — ABNORMAL HIGH (ref <10)

## 2021-08-10 LAB — COMPREHENSIVE METABOLIC PANEL
ALT: 11 U/L (ref 0–44)
AST: 17 U/L (ref 15–41)
Albumin: 3.8 g/dL (ref 3.5–5.0)
Alkaline Phosphatase: 91 U/L (ref 38–126)
Anion gap: 9 (ref 5–15)
BUN: 9 mg/dL (ref 8–23)
CO2: 24 mmol/L (ref 22–32)
Calcium: 9.4 mg/dL (ref 8.9–10.3)
Chloride: 101 mmol/L (ref 98–111)
Creatinine, Ser: 0.86 mg/dL (ref 0.61–1.24)
GFR, Estimated: >60 mL/min (ref >60)
Glucose, Bld: 112 mg/dL — ABNORMAL HIGH (ref 70–99)
Potassium: 3.4 mmol/L — ABNORMAL LOW (ref 3.5–5.1)
Sodium: 134 mmol/L — ABNORMAL LOW (ref 135–145)
Total Bilirubin: 0.2 mg/dL — ABNORMAL LOW (ref 0.3–1.2)
Total Protein: 7.8 g/dL (ref 6.5–8.1)

## 2021-08-10 LAB — SALICYLATE LEVEL: Salicylate Lvl: <7 mg/dL — ABNORMAL LOW (ref 7.0–30.0)

## 2021-08-10 LAB — ACETAMINOPHEN LEVEL: Acetaminophen (Tylenol), Serum: <10 ug/mL — ABNORMAL LOW (ref 10–30)

## 2021-08-10 MED ORDER — SODIUM CHLORIDE 0.9 % IV BOLUS
1000.0000 mL | Freq: Once | INTRAVENOUS | Status: AC
  Administered 2021-08-10: 1000 mL via INTRAVENOUS

## 2021-08-10 NOTE — ED Triage Notes (Signed)
Pt came in with c/o drug overdose. Pt has prescription oxycodone that he takes for his back. He took two of those around 1600. Prior to EMS arrival, pt injected heroin IM into his buttocks. Pt family administered 12 of Narcan. Pt saturations at 97 to 98% on RA. A+O times four

## 2021-08-10 NOTE — ED Provider Notes (Signed)
[facility] EMERGENCY DEPARTMENT Provider Note   CSN: [id number] Arrival date & time: 08/10/21  1910     History  Chief Complaint  Patient presents with   Drug Overdose    Bryan Powell is a 71 y.o. male history of chronic pain, heroin abuse here presenting with drug overdose.  Patient states that he was prescribed oxycodone for his back.  He states that he was in severe pain so he decided to take 2 oxycodone's and also injected heroin into his buttocks.  His family noticed that he has agonal respirations and gave him 12 g of Narcan.  Patient states that he was not trying to kill himself but he was just in a lot of pain.  Patient is known to use heroin previously  The history is provided by the patient.      Home Medications Prior to Admission medications   Medication Sig Start Date End Date Taking? Authorizing Provider  amLODipine (NORVASC) 2.5 MG tablet  05/05/19   [provider]  esomeprazole (NEXIUM) 20 MG capsule Take 1 capsule (20 mg total) by mouth daily at 12 noon. For acid reflux 11/16/18   Hosie Poisson, MD  feeding supplement, ENSURE ENLIVE, (ENSURE ENLIVE) LIQD Take 237 mLs by mouth 2 (two) times daily between meals. 11/16/18   Hosie Poisson, MD  folic acid (FOLVITE) 1 MG tablet Take 1 tablet (1 mg total) by mouth daily. 11/17/18   Hosie Poisson, MD  gabapentin (NEURONTIN) 300 MG capsule Take 300 mg by mouth 3 (three) times daily.    [provider]  lidocaine (LIDODERM) 5 % Place 1 patch onto the skin daily. Remove & Discard patch within 12 hours or as directed by MD 10/10/19   Robyn Haber, MD  LORazepam (ATIVAN) 0.5 MG tablet Take 0.5 mg by mouth 2 (two) times daily.    [provider]  methocarbamol (ROBAXIN) 500 MG tablet Take 1 tablet (500 mg total) by mouth every 6 (six) hours as needed for muscle spasms. 11/16/18   Hosie Poisson, MD  Multiple Vitamin (MULTIVITAMIN WITH MINERALS) TABS tablet Take 1 tablet by mouth daily.  For low vitamin 10/09/13   Lindell Spar I, NP  nicotine (NICODERM CQ - DOSED IN MG/24 HOURS) 21 mg/24hr patch Place 1 patch (21 mg total) onto the skin daily. 11/17/18   Hosie Poisson, MD  Oxycodone HCl 10 MG TABS  05/14/19   [provider]  thiamine 100 MG tablet Take 1 tablet (100 mg total) by mouth daily. 11/17/18   Hosie Poisson, MD  traZODone (DESYREL) 150 MG tablet Take 150 mg by mouth at bedtime. 07/24/17   [provider]      Allergies    Patient has no known allergies.    Review of Systems   Review of Systems  Musculoskeletal:  Positive for back pain.  All other systems reviewed and are negative.  Physical Exam Updated Vital Signs BP (!) 144/77    Pulse 71    Temp 98.2 F (36.8 C) (Oral)    Resp (!) 22    Ht 5\' 10"  (1.778 m)    SpO2 100%    BMI 29.41 kg/m  Physical Exam Vitals and nursing note reviewed.  Constitutional:      Comments: Patient is sleepy but arousable  HENT:     Head: Normocephalic.     Nose: Nose normal.     Mouth/Throat:     Mouth: Mucous membranes are dry.  Eyes:  Comments: Pupils are dilated but reactive  Cardiovascular:     Rate and Rhythm: Normal rate and regular rhythm.     Pulses: Normal pulses.     Heart sounds: Normal heart sounds.  Pulmonary:     Effort: Pulmonary effort is normal.     Breath sounds: Normal breath sounds.  Abdominal:     General: Abdomen is flat.     Palpations: Abdomen is soft.  Musculoskeletal:        General: Normal range of motion.     Cervical back: Normal range of motion and neck supple.  Skin:    General: Skin is warm.     Capillary Refill: Capillary refill takes less than 2 seconds.  Neurological:     Comments: Patient is sleepy but arousable.  Moving all extremities  Psychiatric:     Comments: Unable    ED Results / Procedures / Treatments   Labs (all labs ordered are listed, but only abnormal results are displayed) Labs Reviewed  CBC WITH DIFFERENTIAL/PLATELET  COMPREHENSIVE  METABOLIC PANEL  ETHANOL  SALICYLATE LEVEL  ACETAMINOPHEN LEVEL    EKG EKG Interpretation  Date/Time:  Tuesday August 10 2021 19:32:16 EST Ventricular Rate:  69 PR Interval:  213 QRS Duration: 99 QT Interval:  439 QTC Calculation: 471 R Axis:   56 Text Interpretation: Sinus rhythm Borderline prolonged PR interval Abnormal R-wave progression, early transition No significant change since last tracing Confirmed by Wandra Arthurs [phone]) on 08/10/2021 7:33:04 PM  Radiology No results found.  Procedures Procedures    Angiocath insertion Performed by: Wandra Arthurs  Consent: Verbal consent obtained. Risks and benefits: risks, benefits and alternatives were discussed Time out: Immediately prior to procedure a "time out" was called to verify the correct patient, procedure, equipment, support staff and site/side marked as required.  Preparation: Patient was prepped and draped in the usual sterile fashion.  Vein Location: R brachial  Ultrasound Guided  Gauge: 20 long   Normal blood return and flush without difficulty Patient tolerance: Patient tolerated the procedure well with no immediate complications.    Medications Ordered in ED Medications  sodium chloride 0.9 % bolus 1,000 mL (has no administration in time range)    ED Course/ Medical Decision Making/ A&P                           Medical Decision Making Bryan Powell is a 71 y.o. male here presenting with drug overdose.  Patient is known to use heroin and used some heroin prior to arrival.  Patient was given Narcan as well.  Patient has dilated pupils.  Will check CBC and CMP and alcohol and Tylenol and salicylate and UDS.  Will hydrate patient and reassess mental status  8:45 PM Labs are still pending.  Patient apparently was awake and walked to the nursing station and asked the nurse to remove his IV.  Per nurse, patient is awake and alert and states that he wants to go home.  Patient had a steady gait per  nursing.  Patient signed AMA taper work.  I was unable to reassess patient.  Patient continues to deny any suicidal homicidal ideation  Problems Addressed: Accidental overdose of heroin, initial encounter [facility]): acute illness or injury Chronic pain syndrome: chronic illness or injury  Amount and/or Complexity of Data Reviewed Labs: ordered. Decision-making details documented in ED Course.  Final Clinical Impression(s) / ED Diagnoses Final diagnoses:  None  Rx / DC Orders ED Discharge Orders     None         Drenda Freeze, MD 08/10/21 2046

## 2021-08-10 NOTE — ED Notes (Signed)
Pt walked out of room and said "I am feeling much better than when I came in". Pt elected to leave AMA. IV removed. Provider notified.

## 2021-09-24 ENCOUNTER — Ambulatory Visit: Payer: Medicare HMO | Admitting: Podiatry

## 2021-09-26 ENCOUNTER — Emergency Department (HOSPITAL_BASED_OUTPATIENT_CLINIC_OR_DEPARTMENT_OTHER): Payer: Medicare HMO | Admitting: Radiology

## 2021-09-26 ENCOUNTER — Other Ambulatory Visit: Payer: Self-pay

## 2021-09-26 ENCOUNTER — Emergency Department (HOSPITAL_BASED_OUTPATIENT_CLINIC_OR_DEPARTMENT_OTHER)
Admission: EM | Admit: 2021-09-26 | Discharge: 2021-09-26 | Discharge status: Against Medical Advice | Payer: Medicare HMO | Attending: Emergency Medicine | Admitting: Emergency Medicine

## 2021-09-26 ENCOUNTER — Encounter (HOSPITAL_BASED_OUTPATIENT_CLINIC_OR_DEPARTMENT_OTHER): Payer: Self-pay | Admitting: Emergency Medicine

## 2021-09-26 ENCOUNTER — Emergency Department (HOSPITAL_BASED_OUTPATIENT_CLINIC_OR_DEPARTMENT_OTHER)
Admission: EM | Admit: 2021-09-26 | Discharge: 2021-09-26 | Disposition: A | Discharge status: Home / Self Care | Payer: Medicare HMO | Source: Home / Self Care | Attending: Emergency Medicine | Admitting: Emergency Medicine

## 2021-09-26 DIAGNOSIS — R350 Frequency of micturition: Secondary | ICD-10-CM | POA: Insufficient documentation

## 2021-09-26 DIAGNOSIS — J398 Other specified diseases of upper respiratory tract: Secondary | ICD-10-CM | POA: Insufficient documentation

## 2021-09-26 DIAGNOSIS — R7309 Other abnormal glucose: Secondary | ICD-10-CM | POA: Insufficient documentation

## 2021-09-26 DIAGNOSIS — R0982 Postnasal drip: Secondary | ICD-10-CM | POA: Insufficient documentation

## 2021-09-26 DIAGNOSIS — B349 Viral infection, unspecified: Secondary | ICD-10-CM | POA: Insufficient documentation

## 2021-09-26 DIAGNOSIS — J069 Acute upper respiratory infection, unspecified: Secondary | ICD-10-CM

## 2021-09-26 DIAGNOSIS — D72829 Elevated white blood cell count, unspecified: Secondary | ICD-10-CM | POA: Insufficient documentation

## 2021-09-26 DIAGNOSIS — R634 Abnormal weight loss: Secondary | ICD-10-CM | POA: Insufficient documentation

## 2021-09-26 DIAGNOSIS — Z5321 Procedure and treatment not carried out due to patient leaving prior to being seen by health care provider: Secondary | ICD-10-CM | POA: Diagnosis not present

## 2021-09-26 DIAGNOSIS — R059 Cough, unspecified: Secondary | ICD-10-CM | POA: Insufficient documentation

## 2021-09-26 LAB — CBC WITH DIFFERENTIAL/PLATELET
Abs Immature Granulocytes: 0.2 10*3/uL — ABNORMAL HIGH (ref 0.00–0.07)
Basophils Absolute: 0 10*3/uL (ref 0.0–0.1)
Basophils Relative: 0 %
Eosinophils Absolute: 0 10*3/uL (ref 0.0–0.5)
Eosinophils Relative: 0 %
HCT: 35.2 % — ABNORMAL LOW (ref 39.0–52.0)
Hemoglobin: 11.6 g/dL — ABNORMAL LOW (ref 13.0–17.0)
Immature Granulocytes: 1 %
Lymphocytes Relative: 11 %
Lymphs Abs: 1.8 10*3/uL (ref 0.7–4.0)
MCH: 26.4 pg (ref 26.0–34.0)
MCHC: 33 g/dL (ref 30.0–36.0)
MCV: 80 fL (ref 80.0–100.0)
Monocytes Absolute: 0.6 10*3/uL (ref 0.1–1.0)
Monocytes Relative: 4 %
Neutro Abs: 13.7 10*3/uL — ABNORMAL HIGH (ref 1.7–7.7)
Neutrophils Relative %: 84 %
Platelets: 396 10*3/uL (ref 150–400)
RBC: 4.4 MIL/uL (ref 4.22–5.81)
RDW: 15.8 % — ABNORMAL HIGH (ref 11.5–15.5)
WBC: 16.3 10*3/uL — ABNORMAL HIGH (ref 4.0–10.5)
nRBC: 0 % (ref 0.0–0.2)

## 2021-09-26 LAB — COMPREHENSIVE METABOLIC PANEL
ALT: 19 U/L (ref 0–44)
AST: 18 U/L (ref 15–41)
Albumin: 3.7 g/dL (ref 3.5–5.0)
Alkaline Phosphatase: 77 U/L (ref 38–126)
Anion gap: 9 (ref 5–15)
BUN: 22 mg/dL (ref 8–23)
CO2: 31 mmol/L (ref 22–32)
Calcium: 9.5 mg/dL (ref 8.9–10.3)
Chloride: 97 mmol/L — ABNORMAL LOW (ref 98–111)
Creatinine, Ser: 0.67 mg/dL (ref 0.61–1.24)
GFR, Estimated: >60 mL/min (ref >60)
Glucose, Bld: 160 mg/dL — ABNORMAL HIGH (ref 70–99)
Potassium: 3.7 mmol/L (ref 3.5–5.1)
Sodium: 137 mmol/L (ref 135–145)
Total Bilirubin: 0.6 mg/dL (ref 0.3–1.2)
Total Protein: 7.7 g/dL (ref 6.5–8.1)

## 2021-09-26 LAB — URINALYSIS, ROUTINE W REFLEX MICROSCOPIC
Bilirubin Urine: NEGATIVE
Glucose, UA: NEGATIVE mg/dL
Hgb urine dipstick: NEGATIVE
Ketones, ur: NEGATIVE mg/dL
Leukocytes,Ua: NEGATIVE
Nitrite: NEGATIVE
Specific Gravity, Urine: 1.018 (ref 1.005–1.030)
pH: 7 (ref 5.0–8.0)

## 2021-09-26 MED ORDER — LACTATED RINGERS IV BOLUS
1000.0000 mL | Freq: Once | INTRAVENOUS | Status: AC
  Administered 2021-09-26: 1000 mL via INTRAVENOUS

## 2021-09-26 NOTE — ED Provider Notes (Signed)
?[facility] EMERGENCY DEPT ?Provider Note ? ? ?CSN: [id number] ?Arrival date & time: 09/26/21  1731 ? ?  ? ?History ? ?Chief Complaint  ?Patient presents with  ? Weight Loss  ? ? ?Bryan Powell is a 71 y.o. male.  Presenting to the emergency department with concern for multiple different symptoms.  Patient states that he has been having a frequent cough, nasal congestion over the past week.  States that he has had lots of nasal congestion and this has been very bothersome to him.  Has tried some over-the-counter medications with minimal relief.  Cough has been mostly nonproductive.  States that over the past many months he has also had unintentional weight loss.  States that he is lost up to 50 pounds without trying.  Also states that he is concerned that he may have diabetes, has been urinating more frequently, requesting glucose check. ? ?Does have primary care doctor. ? ?HPI ? ?  ? ?Home Medications ?Prior to Admission medications   ?Medication Sig Start Date End Date Taking? Authorizing Provider  ?amLODipine (NORVASC) 2.5 MG tablet  05/05/19   [provider]  ?esomeprazole (NEXIUM) 20 MG capsule Take 1 capsule (20 mg total) by mouth daily at 12 noon. For acid reflux 11/16/18   Kathlen Mody, MD  ?feeding supplement, ENSURE ENLIVE, (ENSURE ENLIVE) LIQD Take 237 mLs by mouth 2 (two) times daily between meals. 11/16/18   Kathlen Mody, MD  ?folic acid (FOLVITE) 1 MG tablet Take 1 tablet (1 mg total) by mouth daily. 11/17/18   Kathlen Mody, MD  ?gabapentin (NEURONTIN) 300 MG capsule Take 300 mg by mouth 3 (three) times daily.    [provider]  ?lidocaine (LIDODERM) 5 % Place 1 patch onto the skin daily. Remove & Discard patch within 12 hours or as directed by MD 10/10/19   Elvina Sidle, MD  ?LORazepam (ATIVAN) 0.5 MG tablet Take 0.5 mg by mouth 2 (two) times daily.    [provider]  ?methocarbamol (ROBAXIN) 500 MG tablet Take 1 tablet (500 mg total) by mouth every 6  (six) hours as needed for muscle spasms. 11/16/18   Kathlen Mody, MD  ?Multiple Vitamin (MULTIVITAMIN WITH MINERALS) TABS tablet Take 1 tablet by mouth daily. For low vitamin 10/09/13   Armandina Stammer I, NP  ?nicotine (NICODERM CQ - DOSED IN MG/24 HOURS) 21 mg/24hr patch Place 1 patch (21 mg total) onto the skin daily. 11/17/18   Kathlen Mody, MD  ?Oxycodone HCl 10 MG TABS  05/14/19   [provider]  ?thiamine 100 MG tablet Take 1 tablet (100 mg total) by mouth daily. 11/17/18   Kathlen Mody, MD  ?traZODone (DESYREL) 150 MG tablet Take 150 mg by mouth at bedtime. 07/24/17   [provider]  ?   ? ?Allergies    ?Patient has no known allergies.   ? ?Review of Systems   ?Review of Systems  ?Constitutional:  Negative for chills and fever.  ?HENT:  Positive for congestion, sinus pain and sneezing. Negative for ear pain and sore throat.   ?Eyes:  Negative for pain and visual disturbance.  ?Respiratory:  Positive for cough. Negative for shortness of breath.   ?Cardiovascular:  Negative for chest pain and palpitations.  ?Gastrointestinal:  Negative for abdominal pain and vomiting.  ?Genitourinary:  Positive for frequency. Negative for dysuria and hematuria.  ?Musculoskeletal:  Negative for arthralgias and back pain.  ?Skin:  Negative for color change and rash.  ?Neurological:  Negative for seizures and  syncope.  ?All other systems reviewed and are negative. ? ?Physical Exam ?Updated Vital Signs ?BP 133/64   Pulse 78   Temp 98.1 ?F (36.7 ?C) (Oral)   Resp 16   SpO2 97%  ?Physical Exam ?Vitals and nursing note reviewed.  ?Constitutional:   ?   General: He is not in acute distress. ?   Appearance: He is well-developed.  ?HENT:  ?   Head: Normocephalic and atraumatic.  ?Eyes:  ?   Conjunctiva/sclera: Conjunctivae normal.  ?Cardiovascular:  ?   Rate and Rhythm: Normal rate and regular rhythm.  ?   Heart sounds: No murmur heard. ?Pulmonary:  ?   Effort: Pulmonary effort is normal. No respiratory distress.  ?    Breath sounds: Normal breath sounds.  ?Abdominal:  ?   Palpations: Abdomen is soft.  ?   Tenderness: There is no abdominal tenderness.  ?Musculoskeletal:     ?   General: No swelling.  ?   Cervical back: Neck supple.  ?Skin: ?   General: Skin is warm and dry.  ?   Capillary Refill: Capillary refill takes less than 2 seconds.  ?Neurological:  ?   General: No focal deficit present.  ?   Mental Status: He is alert.  ?Psychiatric:     ?   Mood and Affect: Mood normal.  ? ? ?ED Results / Procedures / Treatments   ?Labs ?(all labs ordered are listed, but only abnormal results are displayed) ?Labs Reviewed  ?CBC WITH DIFFERENTIAL/PLATELET - Abnormal; Notable for the following components:  ?    Result Value  ? WBC 16.3 (*)   ? Hemoglobin 11.6 (*)   ? HCT 35.2 (*)   ? RDW 15.8 (*)   ? Neutro Abs 13.7 (*)   ? Abs Immature Granulocytes 0.20 (*)   ? All other components within normal limits  ?COMPREHENSIVE METABOLIC PANEL - Abnormal; Notable for the following components:  ? Chloride 97 (*)   ? Glucose, Bld 160 (*)   ? All other components within normal limits  ?URINALYSIS, ROUTINE W REFLEX MICROSCOPIC - Abnormal; Notable for the following components:  ? Protein, ur TRACE (*)   ? All other components within normal limits  ?HEMOGLOBIN A1C  ? ? ?EKG ?None ? ?Radiology ?DG Chest 2 View ? ?Result Date: 09/26/2021 ?CLINICAL DATA:  Weight loss. EXAM: CHEST - 2 VIEW COMPARISON:  August 28, 2019 FINDINGS: The lungs are mildly hyperinflated. Mild to moderate severity diffuse, chronic appearing increased lung markings are seen. Mild atelectasis is noted within the right lung base. There is no evidence of a pleural effusion or pneumothorax. The heart size and mediastinal contours are within normal limits. Chronic right-sided rib fractures are noted. A stable lobulated lytic lesion is seen within the proximal left humerus. Multilevel degenerative changes seen throughout the thoracic spine. IMPRESSION: Chronic appearing increased lung markings  with mild right basilar atelectasis. Electronically Signed   By: Aram Candela M.D.   On: 09/26/2021 21:04   ? ?Procedures ?Procedures  ? ? ?Medications Ordered in ED ?Medications  ?lactated ringers bolus 1,000 mL (0 mLs Intravenous Stopped 09/26/21 2251)  ? ? ?ED Course/ Medical Decision Making/ A&P ?  ?                        ?Medical Decision Making ?Amount and/or Complexity of Data Reviewed ?Labs: ordered. ?Radiology: ordered. ? ? ?71 y/o male with multiple complaints. Regarding weight loss - not ill appearing, checked  basic labs - grossly stable.  Leukocytosis present however patient has no infectious symptoms.  No anemia, no electrolyte derangement. Regarding urinary frequency, checked UA, sent A1c. His random glu is 160, UA negative.  Will send A1c, instructed patient to follow-up in my chart regarding results and discuss with PCP. Regarding cough, congestion, nasal production suspect viral uri.  Checked CXR, no acute findings, I independently reviewed CXR.  No pneumonia. ? ?Will dc pt home. Rec f/u with his PCP to discuss all of his symptoms as well as the weight loss.  ? ? ? ? ? ? ? ?Final Clinical Impression(s) / ED Diagnoses ?Final diagnoses:  ?Weight loss  ?Viral upper respiratory illness  ? ? ?Rx / DC Orders ?ED Discharge Orders   ? ? None  ? ?  ? ? ?  ?Milagros Lollykstra, Kaliyah Gladman S, MD ?09/27/21 0031 ? ?

## 2021-09-26 NOTE — ED Triage Notes (Signed)
Pt c/o cough and nasal discharge x 4-5 days. ?

## 2021-09-26 NOTE — ED Triage Notes (Signed)
Pt has skin peeling on his feet, has lost weight 50 lbs without trying. Pt wants his glucose checked . ?

## 2021-09-26 NOTE — Discharge Instructions (Signed)
Follow-up with your primary care doctor.  Please check MyChart for results on your A1c. ? ?If you develop difficulty breathing, chest pain or other new concerning symptom, come back to ER for reassessment. ?

## 2021-09-26 NOTE — ED Notes (Signed)
Patient's wife called and stated that she was on her way to come pick him up.  ?

## 2021-09-27 LAB — HEMOGLOBIN A1C
Hgb A1c MFr Bld: 6.1 % — ABNORMAL HIGH (ref 4.8–5.6)
Mean Plasma Glucose: 128.37 mg/dL

## 2021-10-06 ENCOUNTER — Ambulatory Visit: Payer: Medicare HMO | Admitting: Podiatry

## 2021-10-12 ENCOUNTER — Other Ambulatory Visit: Payer: Self-pay | Admitting: Family Medicine

## 2021-10-12 DIAGNOSIS — R918 Other nonspecific abnormal finding of lung field: Secondary | ICD-10-CM

## 2021-10-20 DIAGNOSIS — G8929 Other chronic pain: Secondary | ICD-10-CM | POA: Diagnosis not present

## 2021-10-20 DIAGNOSIS — M79672 Pain in left foot: Secondary | ICD-10-CM | POA: Diagnosis not present

## 2021-10-20 DIAGNOSIS — I10 Essential (primary) hypertension: Secondary | ICD-10-CM | POA: Diagnosis not present

## 2021-10-20 DIAGNOSIS — F17210 Nicotine dependence, cigarettes, uncomplicated: Secondary | ICD-10-CM | POA: Diagnosis not present

## 2021-10-20 DIAGNOSIS — Z79899 Other long term (current) drug therapy: Secondary | ICD-10-CM | POA: Diagnosis not present

## 2021-10-20 DIAGNOSIS — M5416 Radiculopathy, lumbar region: Secondary | ICD-10-CM | POA: Diagnosis not present

## 2021-10-20 DIAGNOSIS — Z87891 Personal history of nicotine dependence: Secondary | ICD-10-CM | POA: Diagnosis not present

## 2021-10-20 DIAGNOSIS — M25562 Pain in left knee: Secondary | ICD-10-CM | POA: Diagnosis not present

## 2021-10-20 DIAGNOSIS — Z131 Encounter for screening for diabetes mellitus: Secondary | ICD-10-CM | POA: Diagnosis not present

## 2021-10-22 ENCOUNTER — Ambulatory Visit: Payer: Medicare HMO | Admitting: Family

## 2021-10-22 DIAGNOSIS — Z79899 Other long term (current) drug therapy: Secondary | ICD-10-CM | POA: Diagnosis not present

## 2021-11-01 ENCOUNTER — Ambulatory Visit: Payer: Medicare HMO | Admitting: Family

## 2021-11-11 ENCOUNTER — Ambulatory Visit: Payer: Medicare HMO | Admitting: Family

## 2021-11-17 DIAGNOSIS — Z87891 Personal history of nicotine dependence: Secondary | ICD-10-CM | POA: Diagnosis not present

## 2021-11-17 DIAGNOSIS — M5416 Radiculopathy, lumbar region: Secondary | ICD-10-CM | POA: Diagnosis not present

## 2021-11-17 DIAGNOSIS — Z79899 Other long term (current) drug therapy: Secondary | ICD-10-CM | POA: Diagnosis not present

## 2021-11-17 DIAGNOSIS — M25562 Pain in left knee: Secondary | ICD-10-CM | POA: Diagnosis not present

## 2021-11-17 DIAGNOSIS — I10 Essential (primary) hypertension: Secondary | ICD-10-CM | POA: Diagnosis not present

## 2021-11-17 DIAGNOSIS — F17210 Nicotine dependence, cigarettes, uncomplicated: Secondary | ICD-10-CM | POA: Diagnosis not present

## 2021-11-17 DIAGNOSIS — G8929 Other chronic pain: Secondary | ICD-10-CM | POA: Diagnosis not present

## 2021-11-18 ENCOUNTER — Ambulatory Visit: Payer: Medicare HMO | Admitting: Physician Assistant

## 2021-11-21 DIAGNOSIS — Z79899 Other long term (current) drug therapy: Secondary | ICD-10-CM | POA: Diagnosis not present

## 2021-11-22 DIAGNOSIS — E46 Unspecified protein-calorie malnutrition: Secondary | ICD-10-CM | POA: Diagnosis not present

## 2021-11-22 DIAGNOSIS — Z125 Encounter for screening for malignant neoplasm of prostate: Secondary | ICD-10-CM | POA: Diagnosis not present

## 2021-11-22 DIAGNOSIS — I10 Essential (primary) hypertension: Secondary | ICD-10-CM | POA: Diagnosis not present

## 2021-11-22 DIAGNOSIS — F419 Anxiety disorder, unspecified: Secondary | ICD-10-CM | POA: Diagnosis not present

## 2021-11-22 DIAGNOSIS — F1020 Alcohol dependence, uncomplicated: Secondary | ICD-10-CM | POA: Diagnosis not present

## 2021-11-22 DIAGNOSIS — B182 Chronic viral hepatitis C: Secondary | ICD-10-CM | POA: Diagnosis not present

## 2021-11-22 DIAGNOSIS — F17210 Nicotine dependence, cigarettes, uncomplicated: Secondary | ICD-10-CM | POA: Diagnosis not present

## 2021-11-22 DIAGNOSIS — G8929 Other chronic pain: Secondary | ICD-10-CM | POA: Diagnosis not present

## 2021-11-22 DIAGNOSIS — F1110 Opioid abuse, uncomplicated: Secondary | ICD-10-CM | POA: Diagnosis not present

## 2021-11-22 DIAGNOSIS — Z79899 Other long term (current) drug therapy: Secondary | ICD-10-CM | POA: Diagnosis not present

## 2021-11-26 ENCOUNTER — Ambulatory Visit (HOSPITAL_COMMUNITY)
Admission: EM | Admit: 2021-11-26 | Discharge: 2021-11-26 | Disposition: A | Discharge status: Home / Self Care | Payer: Medicare HMO | Attending: Physician Assistant | Admitting: Physician Assistant

## 2021-11-26 ENCOUNTER — Ambulatory Visit (INDEPENDENT_AMBULATORY_CARE_PROVIDER_SITE_OTHER): Payer: Medicare HMO

## 2021-11-26 ENCOUNTER — Encounter (HOSPITAL_COMMUNITY): Payer: Self-pay

## 2021-11-26 DIAGNOSIS — S2242XA Multiple fractures of ribs, left side, initial encounter for closed fracture: Secondary | ICD-10-CM | POA: Diagnosis not present

## 2021-11-26 DIAGNOSIS — J439 Emphysema, unspecified: Secondary | ICD-10-CM | POA: Diagnosis not present

## 2021-11-26 DIAGNOSIS — R0781 Pleurodynia: Secondary | ICD-10-CM

## 2021-11-26 DIAGNOSIS — S20212A Contusion of left front wall of thorax, initial encounter: Secondary | ICD-10-CM | POA: Diagnosis not present

## 2021-11-26 DIAGNOSIS — I700 Atherosclerosis of aorta: Secondary | ICD-10-CM | POA: Diagnosis not present

## 2021-11-26 NOTE — ED Provider Notes (Signed)
[facility]    CSN: [id number] Arrival date & time: 11/26/21  1740      History   Chief Complaint Chief Complaint  Patient presents with   Fall    HPI Fleet Higham is a 71 y.o. male.   Patient reports he and hit his left ribs. patient complains of pain in his left ribs and the left side of his chest patient states. he did not hit his head he did not lose consciousness.  Patient reports his left ribs hit on brick steps.  Patient complains of pain with a deep breath  The history is provided by the patient. No language interpreter was used.  Fall This is a new problem. The problem occurs constantly. The problem has not changed since onset.Nothing aggravates the symptoms. Nothing relieves the symptoms. He has tried nothing for the symptoms. The treatment provided no relief.    Past Medical History:  Diagnosis Date   Alcohol abuse    Anxiety    Depression    ED (erectile dysfunction)    Genital herpes    GERD (gastroesophageal reflux disease)    Hep C w/o coma, chronic (HCC)    Heroin abuse (HCC)    Hypertension    Lower extremity venous stasis    Poor venous access    hx. 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   Bilateral lower leg cellulitis    Venous insufficiency of both lower extremities    Bilateral lower extremity edema    Varicose veins of lower extremities with ulcer (HCC)    Severe opioid use disorder (HCC) 10/05/2013   Abscess of left hip 10/04/2012   Finger infection 06/09/2012   Recurrent cellulitis of lower leg 10/12/2011   Lower  extremity venous stasis    Reflux    ALLERGIC RHINITIS 11/02/2007   ERECTILE DYSFUNCTION 11/02/2007   GENITAL HERPES 05/23/2007   Chronic hepatitis C virus infection (HCC) 05/23/2007   ANXIETY 05/23/2007   HEROIN ABUSE 05/23/2007   GERD 05/23/2007   LOW BACK PAIN 05/23/2007    Past Surgical History:  Procedure Laterality Date   CHOLECYSTECTOMY     I & D EXTREMITY  06/09/2012   Procedure: IRRIGATION AND DEBRIDEMENT EXTREMITY;  Surgeon: Sharma Covert, MD;  Location: [facility] OR;  Service: Orthopedics;  Laterality: Left;   LAPAROTOMY N/A 08/01/2016   Procedure: EXPLORATORY LAPAROTOMY;  Surgeon: Berna Bue, MD;  Location: [facility] OR;  Service: General;  Laterality: N/A;   ORBITAL FRACTURE SURGERY     ORIF TIBIA PLATEAU Right 11/14/2018   Procedure: OPEN REDUCTION INTERNAL FIXATION (ORIF) TIBIAL PLATEAU;  Surgeon: Roby Lofts, MD;  Location: [facility] OR;  Service: Orthopedics;  Laterality: Right;   SPLENECTOMY, TOTAL N/A 08/01/2016   Procedure: SPLENECTOMY;  Surgeon: Violeta Gelinas, MD;  Location: [facility] OR;  Service: General;  Laterality: N/A;   TONSILLECTOMY         Home Medications    Prior to Admission medications   Medication Sig Start Date End Date Taking? Authorizing Provider  amLODipine (NORVASC) 2.5 MG tablet  05/05/19   [provider]  esomeprazole (NEXIUM) 20 MG capsule Take 1 capsule (20 mg total) by mouth daily at 12 noon. For acid reflux 11/16/18   Kathlen Mody, MD  feeding supplement, ENSURE ENLIVE, (ENSURE ENLIVE) LIQD Take 237 mLs by mouth 2 (two) times daily between meals. 11/16/18   Kathlen Mody, MD  folic acid (FOLVITE) 1 MG tablet Take 1 tablet (1 mg total) by mouth daily. 11/17/18   Kathlen Mody, MD  gabapentin (NEURONTIN) 300 MG capsule Take 300 mg by mouth 3 (three) times daily.    [provider]  lidocaine (LIDODERM) 5 % Place 1 patch onto the skin daily. Remove & Discard patch within 12 hours or as directed by MD 10/10/19   Elvina Sidle, MD   LORazepam (ATIVAN) 0.5 MG tablet Take 0.5 mg by mouth 2 (two) times daily.    [provider]  methocarbamol (ROBAXIN) 500 MG tablet Take 1 tablet (500 mg total) by mouth every 6 (six) hours as needed for muscle spasms. 11/16/18   Kathlen Mody, MD  Multiple Vitamin (MULTIVITAMIN WITH MINERALS) TABS tablet Take 1 tablet by mouth daily. For low vitamin 10/09/13   Armandina Stammer I, NP  nicotine (NICODERM CQ - DOSED IN MG/24 HOURS) 21 mg/24hr patch Place 1 patch (21 mg total) onto the skin daily. 11/17/18   Kathlen Mody, MD  Oxycodone HCl 10 MG TABS  05/14/19   [provider]  thiamine 100 MG tablet Take 1 tablet (100 mg total) by mouth daily. 11/17/18   Kathlen Mody, MD  traZODone (DESYREL) 150 MG tablet Take 150 mg by mouth at bedtime. 07/24/17   [provider]    Family History Family History  Problem Relation Age of Onset   Stroke Mother    Heart disease Father    Colon cancer Neg Hx    Stomach cancer Neg Hx    Esophageal cancer Neg Hx    Pancreatic cancer Neg Hx     Social History Social History   Tobacco Use   Smoking status: Every Day    Types: Cigarettes    Last attempt to quit: 04/15/2012    Years since quitting: 9.6   Smokeless tobacco: Never  Vaping Use   Vaping Use: Never used  Substance Use Topics   Alcohol use: Yes    Comment: wine about 5 days a week   Drug use: Not Currently    Types: Heroin     Allergies   Patient has no known allergies.   Review of Systems Review of Systems  All other systems reviewed and are negative.    Physical Exam Triage Vital Signs ED Triage Vitals [11/26/21 1813]  Enc Vitals Group     BP (!) 162/87     Pulse Rate 92     Resp 18     Temp 98.1 F (36.7 C)     Temp Source Oral     SpO2 92 %     Weight      Height      Head Circumference      Peak Flow      Pain Score 8     Pain Loc      Pain Edu?      Excl. in GC?    No data found.  Updated Vital Signs BP (!) 162/87 (BP Location: Left Arm)    Pulse 92   Temp 98.1 F (36.7 C) (Oral)   Resp 18   SpO2 92%  Visual Acuity Right Eye Distance:   Left Eye Distance:   Bilateral Distance:    Right Eye Near:   Left Eye Near:    Bilateral Near:     Physical Exam Vitals and nursing note reviewed.  Constitutional:      Appearance: He is well-developed.  HENT:     Head: Normocephalic.     Mouth/Throat:     Mouth: Mucous membranes are moist.  Cardiovascular:     Rate and Rhythm: Normal rate.     Comments: Small area of abrasion left lateral chest tender to palpation Pulmonary:     Effort: Pulmonary effort is normal.  Abdominal:     General: Abdomen is flat. There is no distension.  Musculoskeletal:        General: Normal range of motion.     Cervical back: Normal range of motion.  Neurological:     Mental Status: He is alert and oriented to person, place, and time.      UC Treatments / Results  Labs (all labs ordered are listed, but only abnormal results are displayed) Labs Reviewed - No data to display  EKG   Radiology DG Ribs Unilateral W/Chest Left  Result Date: 11/26/2021 CLINICAL DATA:  fall EXAM: LEFT RIBS AND CHEST - 3+ VIEW COMPARISON:  Chest x-ray 09/26/2021, CT chest 12/09/2019, chest x-ray 09/07/2004 FINDINGS: No no acute displaced fracture or other bone lesions are seen involving the ribs. The heart and mediastinal contours are within normal limits. Aortic calcification. No focal consolidation. Chronic coarsened interstitial markings with no overt pulmonary edema. No pleural effusion. No pneumothorax. No acute osseous abnormality. A partially visualized chronic lobulated proximal humeral shaft lytic lesion. IMPRESSION: 1. No acute displaced left rib fracture. Please note, nondisplaced rib fractures may be occult on radiograph. 2. No acute cardiopulmonary abnormality. 3. Aortic Atherosclerosis (ICD10-I70.0) and Emphysema (ICD10-J43.9). Electronically Signed   By: Tish Frederickson M.D.   On: 11/26/2021 19:18     Procedures Procedures (including critical care time)  Medications Ordered in UC Medications - No data to display  Initial Impression / Assessment and Plan / UC Course  I have reviewed the triage vital signs and the nursing notes.  Pertinent labs & imaging results that were available during my care of the patient were reviewed by me and considered in my medical decision making (see chart for details).     X-ray shows no evidence of displaced fracture patient is advised ice to the area of pain he is to return if any difficulty breathing or other problems Final Clinical Impressions(s) / UC Diagnoses   Final diagnoses:  Contusion of left chest wall, initial encounter     Discharge Instructions      Follow up with your Physician for recheck    ED Prescriptions   None    PDMP not reviewed this encounter.   Elson Areas, [place] 11/26/21 [id number]

## 2021-11-26 NOTE — Discharge Instructions (Signed)
Follow up with your Physician for recheck  

## 2021-11-26 NOTE — ED Triage Notes (Signed)
Pt states tripped and fell walking up his steps. States hit the bricks with lt ribs. Denies SOB.

## 2021-12-07 ENCOUNTER — Encounter: Payer: Self-pay | Admitting: Podiatry

## 2021-12-07 ENCOUNTER — Ambulatory Visit (INDEPENDENT_AMBULATORY_CARE_PROVIDER_SITE_OTHER): Payer: Medicare HMO | Admitting: Podiatry

## 2021-12-07 DIAGNOSIS — F329 Major depressive disorder, single episode, unspecified: Secondary | ICD-10-CM | POA: Insufficient documentation

## 2021-12-07 DIAGNOSIS — M79674 Pain in right toe(s): Secondary | ICD-10-CM | POA: Diagnosis not present

## 2021-12-07 DIAGNOSIS — F17200 Nicotine dependence, unspecified, uncomplicated: Secondary | ICD-10-CM | POA: Insufficient documentation

## 2021-12-07 DIAGNOSIS — Z8781 Personal history of (healed) traumatic fracture: Secondary | ICD-10-CM | POA: Insufficient documentation

## 2021-12-07 DIAGNOSIS — L309 Dermatitis, unspecified: Secondary | ICD-10-CM | POA: Diagnosis not present

## 2021-12-07 DIAGNOSIS — I700 Atherosclerosis of aorta: Secondary | ICD-10-CM | POA: Insufficient documentation

## 2021-12-07 DIAGNOSIS — B351 Tinea unguium: Secondary | ICD-10-CM

## 2021-12-07 DIAGNOSIS — Q8901 Asplenia (congenital): Secondary | ICD-10-CM | POA: Insufficient documentation

## 2021-12-07 DIAGNOSIS — M79675 Pain in left toe(s): Secondary | ICD-10-CM | POA: Diagnosis not present

## 2021-12-07 DIAGNOSIS — G4700 Insomnia, unspecified: Secondary | ICD-10-CM | POA: Insufficient documentation

## 2021-12-07 DIAGNOSIS — J449 Chronic obstructive pulmonary disease, unspecified: Secondary | ICD-10-CM | POA: Insufficient documentation

## 2021-12-07 DIAGNOSIS — B352 Tinea manuum: Secondary | ICD-10-CM | POA: Diagnosis not present

## 2021-12-07 DIAGNOSIS — F339 Major depressive disorder, recurrent, unspecified: Secondary | ICD-10-CM | POA: Insufficient documentation

## 2021-12-07 DIAGNOSIS — R7303 Prediabetes: Secondary | ICD-10-CM | POA: Insufficient documentation

## 2021-12-07 DIAGNOSIS — Z9081 Acquired absence of spleen: Secondary | ICD-10-CM | POA: Insufficient documentation

## 2021-12-07 DIAGNOSIS — E7800 Pure hypercholesterolemia, unspecified: Secondary | ICD-10-CM | POA: Insufficient documentation

## 2021-12-07 MED ORDER — BETAMETHASONE DIPROPIONATE 0.05 % EX CREA: TOPICAL_CREAM | Freq: Two times a day (BID) | CUTANEOUS | 1 refills | Status: DC

## 2021-12-11 ENCOUNTER — Encounter: Payer: Self-pay | Admitting: Podiatry

## 2021-12-11 NOTE — Progress Notes (Signed)
  Subjective:  [name], male    DOB: 1951-02-15,  MRN: [id number]  Chief Complaint  Patient presents with   Tinea Pedis      NP BIL poor circulation in feet, pain in toes,  and skin is coming off of feet    71 y.o. male presents with the above . History confirmed with patient.   Objective:  Physical Exam: warm, good capillary refill, no trophic changes or ulcerative lesions, normal DP and PT pulses, normal sensory exam, venous stasis dermatitis noted, and significant varicosities noted.   Left Foot: dystrophic yellowed discolored nail plates with subungual debris Right Foot: dystrophic yellowed discolored nail plates with subungual debris  Assessment:   1. Dermatitis of both feet   2. Tinea manuum   3. Pain due to onychomycosis of toenails of both feet      Plan:  Patient was evaluated and treated and all questions answered.  Discussed the etiology and treatment options for the condition in detail with the patient. Educated patient on the topical and oral treatment options for mycotic nails. Recommended debridement of the nails today. Sharp and mechanical debridement performed of all painful and mycotic nails today. Nails debrided in length and thickness using a nail nipper to level of comfort. Discussed treatment options including appropriate shoe gear. Follow up as needed for painful nails.   He has significant dermatitis likely secondary to venous stasis insufficiency.  He also has issues with his hands and fingers.  I sent a referral to dermatology and prescribed him betamethasone dipropionate cream to use for this  Return in about 3 months (around 03/09/2022) for painful thick nails.

## 2021-12-20 ENCOUNTER — Other Ambulatory Visit: Payer: Self-pay | Admitting: Family Medicine

## 2021-12-20 DIAGNOSIS — R413 Other amnesia: Secondary | ICD-10-CM

## 2021-12-20 DIAGNOSIS — D649 Anemia, unspecified: Secondary | ICD-10-CM

## 2021-12-20 DIAGNOSIS — R634 Abnormal weight loss: Secondary | ICD-10-CM

## 2022-01-03 ENCOUNTER — Other Ambulatory Visit: Payer: Medicare HMO

## 2022-01-06 LAB — COLOGUARD: Cologuard: POSITIVE — AB

## 2022-01-14 LAB — COLOGUARD: COLOGUARD: POSITIVE — AB

## 2022-01-21 ENCOUNTER — Telehealth: Payer: Self-pay | Admitting: Internal Medicine

## 2022-01-24 NOTE — Telephone Encounter (Signed)
Left message for pt wife to call back

## 2022-01-25 NOTE — Telephone Encounter (Signed)
Left message for pt or pt wife to call back

## 2022-01-26 NOTE — Telephone Encounter (Unsigned)
Left message for pt to call back  °

## 2022-01-26 NOTE — Telephone Encounter (Unsigned)
Left message for pt or pt wife to call back  

## 2022-01-27 ENCOUNTER — Ambulatory Visit
Admission: RE | Admit: 2022-01-27 | Discharge: 2022-01-27 | Disposition: A | Discharge status: Home / Self Care | Payer: Medicare HMO | Source: Ambulatory Visit | Attending: Family Medicine | Admitting: Family Medicine

## 2022-01-27 ENCOUNTER — Other Ambulatory Visit: Payer: Self-pay | Admitting: Family Medicine

## 2022-01-27 DIAGNOSIS — R413 Other amnesia: Secondary | ICD-10-CM

## 2022-01-27 DIAGNOSIS — D649 Anemia, unspecified: Secondary | ICD-10-CM

## 2022-01-27 DIAGNOSIS — R634 Abnormal weight loss: Secondary | ICD-10-CM

## 2022-01-27 NOTE — Telephone Encounter (Signed)
Left message for pt to call back  °

## 2022-01-27 NOTE — Telephone Encounter (Signed)
PT returning call. Please advise. Thank you

## 2022-01-28 NOTE — Telephone Encounter (Signed)
Left message for pt to call back  °

## 2022-01-31 NOTE — Telephone Encounter (Signed)
Left message for pt to call back  °

## 2022-02-02 NOTE — Telephone Encounter (Signed)
Left message for pt to call back. My Chart message sent to pt.  

## 2022-02-08 ENCOUNTER — Telehealth: Payer: Self-pay

## 2022-02-08 NOTE — Telephone Encounter (Signed)
Pt called in and stated that his PCP Dr. Thomasena Edis performed a Cologuard test which came back positive: Pt stated that he has noticed for the last several  weeks a small amount of pink tinged blood in the toilet after a BM and some pick tinged  blood on the toilet paper after he wipes after a BM: Pt is requesting to be set up for a Colonoscopy with Dr. Leone Payor soon: Per pt and Per Pt chart it seems that this pt is going to have to be done in the Hospital due to poor vascular access: Pt stated that he has had a weight loss in the past 5 months from 195 to 138: Pt was scheduled for an office Visit with Willette Cluster on 02/25/2022 at 1:30: Pt made aware: Pt stated that he recently had labs done at his recent office visit with PCP: Pt stated that he will bring those results by our office today along with Cologuard results: Please advise

## 2022-02-10 NOTE — Telephone Encounter (Signed)
Agree with plan 

## 2022-02-10 NOTE — Telephone Encounter (Signed)
Pt brought by recent labs that were done on 12/15/2021 that were ordered by Pt PCP  Irena Reichmann: Copies were made and originals were sent to be scanned into Epic:

## 2022-02-16 NOTE — Telephone Encounter (Signed)
Inbound call from patient stating he now has documentation of blood that was in is feces? Please give patient a call back to further advise.  Thank you

## 2022-02-16 NOTE — Telephone Encounter (Signed)
Pt stated that he is trying to locate the results from his recent Cologuard test and print them and bring them by our office: Pt was notified that when he brings them by then we will make a copy and have them scanned into our computer system:  Pt verbalized understanding with all questions answered.

## 2022-02-18 ENCOUNTER — Telehealth: Payer: Self-pay | Admitting: Internal Medicine

## 2022-02-18 NOTE — Telephone Encounter (Signed)
Patient called states he will be coming to the office to drop his feces at the front desk if he can't see.

## 2022-02-18 NOTE — Telephone Encounter (Signed)
Called patient, no response. Left Vm for patient to go to the lab in the basement for drop off. Thankx

## 2022-02-25 ENCOUNTER — Telehealth: Payer: Self-pay | Admitting: Internal Medicine

## 2022-02-25 ENCOUNTER — Ambulatory Visit: Payer: Medicare HMO | Admitting: Nurse Practitioner

## 2022-02-25 ENCOUNTER — Other Ambulatory Visit: Payer: Medicare HMO

## 2022-02-25 NOTE — Telephone Encounter (Signed)
Patient was late to his appointment with Willette Cluster, NP this afternoon. I rescheduled patient to next available 03/15/22. Patient is wanting to know what he should do in the meantime? He states that he continues to lose weight.

## 2022-02-25 NOTE — Telephone Encounter (Signed)
Patient came to 2nd floor after speaking with Judeth Cornfield.  He was very agitated and requested to speak with Viviann Spare.  He was informed Viviann Spare was not available but could leave him a message if he wished.to.  He wanted to fuss about not being seen, but it was reiterated to him that his appointment was at 1:30; if he was indeed here in the building at that time, he should have checked in first, then seen what the provider wanted him to do.  Even though he said he received a call from here stating he needed to go to the lab prior to his appointment, his appointment was at 1:30 and he should have arrived earlier for the bloodwork or at least checked in with the front desk first at his appointment time.

## 2022-03-11 ENCOUNTER — Telehealth: Payer: Self-pay

## 2022-03-11 NOTE — Telephone Encounter (Signed)
Pt dropped off positive  Cologuard test document: Copy was made and placed and Tye Savoy NP desk along with copy sent to be scanned into epic.

## 2022-03-15 ENCOUNTER — Ambulatory Visit (INDEPENDENT_AMBULATORY_CARE_PROVIDER_SITE_OTHER): Payer: Medicare HMO | Admitting: Nurse Practitioner

## 2022-03-15 ENCOUNTER — Encounter: Payer: Self-pay | Admitting: Nurse Practitioner

## 2022-03-15 ENCOUNTER — Other Ambulatory Visit: Payer: Medicare HMO

## 2022-03-15 VITALS — BP 142/58 | HR 64 | Ht 67.0 in | Wt 134.0 lb

## 2022-03-15 DIAGNOSIS — D649 Anemia, unspecified: Secondary | ICD-10-CM | POA: Diagnosis not present

## 2022-03-15 DIAGNOSIS — R634 Abnormal weight loss: Secondary | ICD-10-CM | POA: Diagnosis not present

## 2022-03-15 DIAGNOSIS — R195 Other fecal abnormalities: Secondary | ICD-10-CM | POA: Diagnosis not present

## 2022-03-15 NOTE — H&P (View-Only) (Signed)
Chief Complaint:  positive cologuard, weight loss, constipation.    Assessment &  Plan   #71 year old male with positive Cologuard ( July 2023), profound weight loss. He was last seen in 2021. Will discuss with patient's primary GI, Dr. Carlean Purl about a date / time for colonoscopy to be done at the hospital. We must have previously attempted procedure but couldn't get IV access.  We spoke with IR today, apparently it is the IV team who will place IV line. When the time comes we need to place order for "IV team" in [place]. If we don't have any available times for the colonoscopy in the very near future then recommend proceeding with a CT scan abd / pelvis in the ASAP to evaluate severe weight loss.  Down ~ 70 pounds in two years Needs 2 day bowel prep CMET, CBC, iron studies if lab able to obtain blood   # Constipation. Less frequent and hard stools over the last several months. This is concerning in setting of unintentional weight loss and positive Cologuard Trial of miralax BID Two colace at bedtime   # [place] anemia. Note he was anemic in June 2020 but that was surrounding a hospitalization. Hgb normal at 14 in February of this year, down to 11.8 in April.   Will try to get iron studies if lab able to obtain blood.   HPI   Bryan Powell is a 71 year old male known to Dr. Carlean Purl with a past medical history significant for GERD, prediabetes, treated hepatitis C, polysubstance abuse, chronic low back pain, cholecystectomy, splenectomy.   I saw Bryan Powell September 2021.  Sometime prior to that in 2019 he had been scheduled for colonoscopy but it could not be done due to poor venous access.  Plan was for him to return to clinic but that did not occur.  When I saw him in September 2021 the plan was for colonoscopy at the hospital with Dr. Carlean Purl.  We plan to get IR to place an IV line prior to the procedure.  He was put on a waiting list as there had been scheduling delays with the hospital due to the  [place] pandemic.  Additionally Dr. Carlean Purl had recently been out on medical leave.  I see a phone note from 03/25/2020 where we reached out to Bryan Powell about scheduling his procedure.  According to that note he was hesitant about scheduling the colonoscopy and so it was not done.  There is another phone note from 01/21/2022 when patient's wife called / came in asking to have the colonoscopy scheduled, he had recently a positive Cologuard.  He was scheduled for an appointment on 02/25/2022 but arrived late for the appointment and was not seen.  He was rescheduled for today   Interval History:  Bryan Powell sometimes sees a pink substance in stool but doesn't know if it is blood. He has been having problems with constipation with large, hard stools over the last 6 months. He has unintentionally lost  a lot of weight.   Doesn't have an appetite.  Review of weight in Epic shows in Sept 2021 he was 205 pounds. In April 2023 he was 150 pounds and today he is 134. Tells me he was seen in ED in April with multiple complaints. Labs not overly concerning except for anemia.  Recommended he follow up with PCP for weight loss.     Labs:     Latest Ref Rng & Units 09/26/2021    9:35 PM 08/10/2021  7:43 PM 11/16/2018    3:10 AM  CBC  WBC 4.0 - 10.5 K/uL 16.3  8.1  12.3   Hemoglobin 13.0 - 17.0 g/dL 11.6  14.0  12.4   Hematocrit 39.0 - 52.0 % 35.2  43.1  37.2   Platelets 150 - 400 K/uL 396  348  230        Latest Ref Rng & Units 09/26/2021    9:35 PM 08/10/2021    7:43 PM 08/02/2016    1:00 AM  Hepatic Function  Total Protein 6.5 - 8.1 g/dL 7.7  7.8  4.4   Albumin 3.5 - 5.0 g/dL 3.7  3.8  2.6   AST 15 - 41 U/L 18  17  38   ALT 0 - 44 U/L '19  11  21   ' Alk Phosphatase 38 - 126 U/L 77  91  43   Total Bilirubin 0.3 - 1.2 mg/dL 0.6  0.2  0.7      Past Medical History:  Diagnosis Date   Alcohol abuse    Anxiety    Depression    ED (erectile dysfunction)    Genital herpes    GERD (gastroesophageal reflux  disease)    Hep C w/o coma, chronic (HCC)    Heroin abuse ([place])    Hypertension    Lower extremity venous stasis    Poor venous access    hx. of Heroin, Alcohol abuse "extreme difficult vein access"   Reflux     Past Surgical History:  Procedure Laterality Date   CHOLECYSTECTOMY     I & D EXTREMITY  06/09/2012   Procedure: IRRIGATION AND DEBRIDEMENT EXTREMITY;  Surgeon: Linna Hoff, MD;  Location: [facility];  Service: Orthopedics;  Laterality: Left;   LAPAROTOMY N/A 08/01/2016   Procedure: EXPLORATORY LAPAROTOMY;  Surgeon: Clovis Riley, MD;  Location: [facility];  Service: General;  Laterality: N/A;   ORBITAL FRACTURE SURGERY     ORIF TIBIA PLATEAU Right 11/14/2018   Procedure: OPEN REDUCTION INTERNAL FIXATION (ORIF) TIBIAL PLATEAU;  Surgeon: Shona Needles, MD;  Location: [facility];  Service: Orthopedics;  Laterality: Right;   SPLENECTOMY, TOTAL N/A 08/01/2016   Procedure: SPLENECTOMY;  Surgeon: Georganna Skeans, MD;  Location: [facility];  Service: General;  Laterality: N/A;   TONSILLECTOMY      Current Medications, Allergies, Family History and Social History were reviewed in [facility] record.     Current Outpatient Medications  Medication Sig Dispense Refill   amLODipine (NORVASC) 2.5 MG tablet      betamethasone dipropionate 0.05 % cream Apply topically 2 (two) times daily. 45 g 1   esomeprazole (NEXIUM) 20 MG capsule Take 1 capsule (20 mg total) by mouth daily at 12 noon. For acid reflux 30 capsule 0   feeding supplement, ENSURE ENLIVE, (ENSURE ENLIVE) LIQD Take 237 mLs by mouth 2 (two) times daily between meals. 314 mL 12   folic acid (FOLVITE) 1 MG tablet Take 1 tablet (1 mg total) by mouth daily. 30 tablet 0   gabapentin (NEURONTIN) 300 MG capsule Take 300 mg by mouth 3 (three) times daily.     lidocaine (LIDODERM) 5 % Place 1 patch onto the skin daily. Remove & Discard patch within 12 hours or as directed by MD 7 patch 0   LORazepam (ATIVAN) 0.5 MG tablet Take  0.5 mg by mouth 2 (two) times daily.     methocarbamol (ROBAXIN) 500 MG tablet Take 1 tablet (500 mg total) by  mouth every 6 (six) hours as needed for muscle spasms. 10 tablet 0   Multiple Vitamin (MULTIVITAMIN WITH MINERALS) TABS tablet Take 1 tablet by mouth daily. For low vitamin     nicotine (NICODERM CQ - DOSED IN MG/24 HOURS) 21 mg/24hr patch Place 1 patch (21 mg total) onto the skin daily. 28 patch 0   Oxycodone HCl 10 MG TABS      thiamine 100 MG tablet Take 1 tablet (100 mg total) by mouth daily. 30 tablet 0   traZODone (DESYREL) 150 MG tablet Take 150 mg by mouth at bedtime.     No current facility-administered medications for this visit.    Review of Systems: No chest pain. No shortness of breath. No urinary complaints.    Physical Exam  Wt Readings from Last 3 Encounters:  09/26/21 150 lb (68 kg)  03/03/20 205 lb (93 kg)  01/29/20 203 lb 12.8 oz (92.4 kg)    BP (!) 142/58   Pulse 64   Ht '5\' 7"'  (1.702 m)   Wt 134 lb (60.8 kg)   BMI 20.99 kg/m  Constitutional:  Nearly emaciated in no acute distress. Psychiatric: Pleasant. Normal mood and affect. Behavior is normal. EENT: Pupils normal.  Conjunctivae are normal. No scleral icterus. Neck supple.  Cardiovascular: Normal rate, regular rhythm. No edema Pulmonary/chest: Effort normal and breath sounds normal. No wheezing, rales or rhonchi. Abdominal: Soft, protuberant, nontender. Bowel sounds active throughout. There are no masses palpable. No hepatomegaly. Neurological: Alert and oriented to person place and time. Skin: Skin is warm and dry. No rashes noted.  Tye Savoy, NP  03/15/2022, 12:44 PM

## 2022-03-15 NOTE — Progress Notes (Signed)
Chief Complaint:  positive cologuard, weight loss, constipation.    Assessment &  Plan   #71 year old male with positive Cologuard ( July 2023), profound weight loss. He was last seen in 2021. Will discuss with patient's primary GI, Dr. Carlean Purl about a date / time for colonoscopy to be done at the hospital. We must have previously attempted procedure but couldn't get IV access.  We spoke with IR today, apparently it is the IV team who will place IV line. When the time comes we need to place order for "IV team" in [place]. If we don't have any available times for the colonoscopy in the very near future then recommend proceeding with a CT scan abd / pelvis in the ASAP to evaluate severe weight loss.  Down ~ 70 pounds in two years Needs 2 day bowel prep CMET, CBC, iron studies if lab able to obtain blood   # Constipation. Less frequent and hard stools over the last several months. This is concerning in setting of unintentional weight loss and positive Cologuard Trial of miralax BID Two colace at bedtime   # [place] anemia. Note he was anemic in June 2020 but that was surrounding a hospitalization. Hgb normal at 14 in February of this year, down to 11.8 in April.   Will try to get iron studies if lab able to obtain blood.   HPI   Bryan Powell is a 71 year old male known to Dr. Carlean Purl with a past medical history significant for GERD, prediabetes, treated hepatitis C, polysubstance abuse, chronic low back pain, cholecystectomy, splenectomy.   I saw Kilian September 2021.  Sometime prior to that in 2019 he had been scheduled for colonoscopy but it could not be done due to poor venous access.  Plan was for him to return to clinic but that did not occur.  When I saw him in September 2021 the plan was for colonoscopy at the hospital with Dr. Carlean Purl.  We plan to get IR to place an IV line prior to the procedure.  He was put on a waiting list as there had been scheduling delays with the hospital due to the  [place] pandemic.  Additionally Dr. Carlean Purl had recently been out on medical leave.  I see a phone note from 03/25/2020 where we reached out to Mr. Ileene Patrick about scheduling his procedure.  According to that note he was hesitant about scheduling the colonoscopy and so it was not done.  There is another phone note from 01/21/2022 when patient's wife called / came in asking to have the colonoscopy scheduled, he had recently a positive Cologuard.  He was scheduled for an appointment on 02/25/2022 but arrived late for the appointment and was not seen.  He was rescheduled for today   Interval History:  Derrius sometimes sees a pink substance in stool but doesn't know if it is blood. He has been having problems with constipation with large, hard stools over the last 6 months. He has unintentionally lost  a lot of weight.   Doesn't have an appetite.  Review of weight in Epic shows in Sept 2021 he was 205 pounds. In April 2023 he was 150 pounds and today he is 134. Tells me he was seen in ED in April with multiple complaints. Labs not overly concerning except for anemia.  Recommended he follow up with PCP for weight loss.     Labs:     Latest Ref Rng & Units 09/26/2021    9:35 PM 08/10/2021  7:43 PM 11/16/2018    3:10 AM  CBC  WBC 4.0 - 10.5 K/uL 16.3  8.1  12.3   Hemoglobin 13.0 - 17.0 g/dL 11.6  14.0  12.4   Hematocrit 39.0 - 52.0 % 35.2  43.1  37.2   Platelets 150 - 400 K/uL 396  348  230        Latest Ref Rng & Units 09/26/2021    9:35 PM 08/10/2021    7:43 PM 08/02/2016    1:00 AM  Hepatic Function  Total Protein 6.5 - 8.1 g/dL 7.7  7.8  4.4   Albumin 3.5 - 5.0 g/dL 3.7  3.8  2.6   AST 15 - 41 U/L 18  17  38   ALT 0 - 44 U/L '19  11  21   ' Alk Phosphatase 38 - 126 U/L 77  91  43   Total Bilirubin 0.3 - 1.2 mg/dL 0.6  0.2  0.7      Past Medical History:  Diagnosis Date   Alcohol abuse    Anxiety    Depression    ED (erectile dysfunction)    Genital herpes    GERD (gastroesophageal reflux  disease)    Hep C w/o coma, chronic (HCC)    Heroin abuse ([place])    Hypertension    Lower extremity venous stasis    Poor venous access    hx. of Heroin, Alcohol abuse "extreme difficult vein access"   Reflux     Past Surgical History:  Procedure Laterality Date   CHOLECYSTECTOMY     I & D EXTREMITY  06/09/2012   Procedure: IRRIGATION AND DEBRIDEMENT EXTREMITY;  Surgeon: Linna Hoff, MD;  Location: [facility];  Service: Orthopedics;  Laterality: Left;   LAPAROTOMY N/A 08/01/2016   Procedure: EXPLORATORY LAPAROTOMY;  Surgeon: Clovis Riley, MD;  Location: [facility];  Service: General;  Laterality: N/A;   ORBITAL FRACTURE SURGERY     ORIF TIBIA PLATEAU Right 11/14/2018   Procedure: OPEN REDUCTION INTERNAL FIXATION (ORIF) TIBIAL PLATEAU;  Surgeon: Shona Needles, MD;  Location: [facility];  Service: Orthopedics;  Laterality: Right;   SPLENECTOMY, TOTAL N/A 08/01/2016   Procedure: SPLENECTOMY;  Surgeon: Georganna Skeans, MD;  Location: [facility];  Service: General;  Laterality: N/A;   TONSILLECTOMY      Current Medications, Allergies, Family History and Social History were reviewed in [facility] record.     Current Outpatient Medications  Medication Sig Dispense Refill   amLODipine (NORVASC) 2.5 MG tablet      betamethasone dipropionate 0.05 % cream Apply topically 2 (two) times daily. 45 g 1   esomeprazole (NEXIUM) 20 MG capsule Take 1 capsule (20 mg total) by mouth daily at 12 noon. For acid reflux 30 capsule 0   feeding supplement, ENSURE ENLIVE, (ENSURE ENLIVE) LIQD Take 237 mLs by mouth 2 (two) times daily between meals. 062 mL 12   folic acid (FOLVITE) 1 MG tablet Take 1 tablet (1 mg total) by mouth daily. 30 tablet 0   gabapentin (NEURONTIN) 300 MG capsule Take 300 mg by mouth 3 (three) times daily.     lidocaine (LIDODERM) 5 % Place 1 patch onto the skin daily. Remove & Discard patch within 12 hours or as directed by MD 7 patch 0   LORazepam (ATIVAN) 0.5 MG tablet Take  0.5 mg by mouth 2 (two) times daily.     methocarbamol (ROBAXIN) 500 MG tablet Take 1 tablet (500 mg total) by  mouth every 6 (six) hours as needed for muscle spasms. 10 tablet 0   Multiple Vitamin (MULTIVITAMIN WITH MINERALS) TABS tablet Take 1 tablet by mouth daily. For low vitamin     nicotine (NICODERM CQ - DOSED IN MG/24 HOURS) 21 mg/24hr patch Place 1 patch (21 mg total) onto the skin daily. 28 patch 0   Oxycodone HCl 10 MG TABS      thiamine 100 MG tablet Take 1 tablet (100 mg total) by mouth daily. 30 tablet 0   traZODone (DESYREL) 150 MG tablet Take 150 mg by mouth at bedtime.     No current facility-administered medications for this visit.    Review of Systems: No chest pain. No shortness of breath. No urinary complaints.    Physical Exam  Wt Readings from Last 3 Encounters:  09/26/21 150 lb (68 kg)  03/03/20 205 lb (93 kg)  01/29/20 203 lb 12.8 oz (92.4 kg)    BP (!) 142/58   Pulse 64   Ht '5\' 7"'  (1.702 m)   Wt 134 lb (60.8 kg)   BMI 20.99 kg/m  Constitutional:  Nearly emaciated in no acute distress. Psychiatric: Pleasant. Normal mood and affect. Behavior is normal. EENT: Pupils normal.  Conjunctivae are normal. No scleral icterus. Neck supple.  Cardiovascular: Normal rate, regular rhythm. No edema Pulmonary/chest: Effort normal and breath sounds normal. No wheezing, rales or rhonchi. Abdominal: Soft, protuberant, nontender. Bowel sounds active throughout. There are no masses palpable. No hepatomegaly. Neurological: Alert and oriented to person place and time. Skin: Skin is warm and dry. No rashes noted.  Tye Savoy, NP  03/15/2022, 12:44 PM

## 2022-03-15 NOTE — Patient Instructions (Addendum)
Your provider has requested that you go to the basement level for lab work before leaving today. Press "B" on the elevator. The lab is located at the first door on the left as you exit the elevator.   Constipation:  Mix one capful in 8 oz of water and drink twice daily  Take two stool softeners (like colace) at bedtime

## 2022-03-18 ENCOUNTER — Ambulatory Visit: Payer: Medicare HMO | Admitting: Podiatry

## 2022-03-21 ENCOUNTER — Other Ambulatory Visit: Payer: Self-pay

## 2022-03-21 ENCOUNTER — Telehealth: Payer: Self-pay

## 2022-03-21 DIAGNOSIS — R634 Abnormal weight loss: Secondary | ICD-10-CM

## 2022-03-21 DIAGNOSIS — D649 Anemia, unspecified: Secondary | ICD-10-CM

## 2022-03-21 DIAGNOSIS — R195 Other fecal abnormalities: Secondary | ICD-10-CM

## 2022-03-21 NOTE — Addendum Note (Signed)
Addended by: Berniece Salines A on: 03/21/2022 10:52 AM   Modules accepted: Orders

## 2022-03-21 NOTE — Telephone Encounter (Signed)
-----   Message from Willia Craze, NP sent at 03/21/2022  8:47 AM EDT ----- Bryan Powell,  This patient needs a colonoscopy at the hospital. I had to get with gessner to get a date. Please see his comments below.   I should be able to scope him 10/23 at [facility]   That is a Monday of a yellow week and I can add him on to follow my 0730 colonoscopy   May have to call [facility] first to double check as sometimes centralized scheduling is not aware that we can do this    He needs double prep and if MiraLax used would only use once not twice -  he needs a previsit to improve compliance with prep   Bryan Powell, doing procedure at hospital because he has poor venous access. He will need IV team to place IV prior to procedure. Manson Passey can tell you how to arrange this. Thanks

## 2022-03-21 NOTE — Telephone Encounter (Signed)
Pt scheduled at [facility] on 04/04/22 at 8:30 am. Previsit scheduled for 03/23/22 at 4:30 pm. Ambulatory referral placed. IV team consult order placed for 10/23. Spoke with pt to let him know date and time of appointments and pt verbalized understanding.

## 2022-03-23 ENCOUNTER — Telehealth: Payer: Self-pay

## 2022-03-23 ENCOUNTER — Ambulatory Visit (AMBULATORY_SURGERY_CENTER): Payer: Self-pay

## 2022-03-23 VITALS — Ht 67.0 in | Wt 134.0 lb

## 2022-03-23 DIAGNOSIS — Z1211 Encounter for screening for malignant neoplasm of colon: Secondary | ICD-10-CM

## 2022-03-23 NOTE — Progress Notes (Signed)
No egg or soy allergy known to patient  No issues known to pt with past sedation with any surgeries or procedures Patient denies ever being told they had issues or difficulty with intubation  No FH of Malignant Hyperthermia Pt is not on diet pills Pt is not on home 02  Pt is not on blood thinners  Pt reports issues with constipation - advised patient to start on Miralax 1 capful twice daily x 5 days prior to procedure as he does not have a normal bowel movement daily-patient reports he is eating fruits and veggies and drinking water;  No A fib or A flutter Have any cardiac testing pending--NO Pt instructed to use [employer] or GoodRx for a price reduction on prep

## 2022-03-23 NOTE — Telephone Encounter (Signed)
Pt was made aware of Dr. Carlean Purl request to change appointment date to 04/05/2022 at 0730 AM: Pt is here for previsit appointment: Previsit nurse notified of change prior to the start of pt appointment:  Pt verbalized understanding with all questions answered.

## 2022-03-30 ENCOUNTER — Telehealth: Payer: Self-pay | Admitting: Internal Medicine

## 2022-03-30 ENCOUNTER — Encounter: Payer: Self-pay | Admitting: Internal Medicine

## 2022-03-30 MED ORDER — CLONAZEPAM 1 MG PO TBDP: ORAL_TABLET | ORAL | 0 refills | Status: DC

## 2022-03-30 NOTE — Telephone Encounter (Signed)
Left message for pt to call back  °

## 2022-03-30 NOTE — Telephone Encounter (Signed)
I received a handwritten letter from the patient when he came for previsit.  He has poor IV access and is anxious about this and was requesting a sedative to use prior.  Please let him know I sent a small prescription for disintegrating clonazepam to take the morning of his colonoscopy.  He can take that before he comes to the hospital.

## 2022-03-31 NOTE — Telephone Encounter (Signed)
Pt was made aware of prescription sent in by Dr. Carlean Purl for disintegrating clonazepam to take the morning of his colonoscopy.  Pt notified to take that before he comes to the hospital. Pt was notified that this is a disintegrating tablet:  Pt verbalized understanding with all questions answered.

## 2022-04-05 ENCOUNTER — Other Ambulatory Visit: Payer: Self-pay

## 2022-04-05 ENCOUNTER — Ambulatory Visit (HOSPITAL_COMMUNITY): Payer: Medicare HMO | Admitting: Anesthesiology

## 2022-04-05 ENCOUNTER — Ambulatory Visit (HOSPITAL_COMMUNITY)
Admission: RE | Admit: 2022-04-05 | Discharge: 2022-04-05 | Disposition: A | Discharge status: Home / Self Care | Payer: Medicare HMO | Attending: Internal Medicine | Admitting: Internal Medicine

## 2022-04-05 ENCOUNTER — Encounter (HOSPITAL_COMMUNITY): Payer: Self-pay | Admitting: Internal Medicine

## 2022-04-05 ENCOUNTER — Encounter (HOSPITAL_COMMUNITY)
Admission: RE | Disposition: A | Discharge status: Home / Self Care | Payer: Self-pay | Source: Home / Self Care | Attending: Internal Medicine

## 2022-04-05 ENCOUNTER — Ambulatory Visit (HOSPITAL_BASED_OUTPATIENT_CLINIC_OR_DEPARTMENT_OTHER): Payer: Medicare HMO | Admitting: Anesthesiology

## 2022-04-05 DIAGNOSIS — R195 Other fecal abnormalities: Secondary | ICD-10-CM

## 2022-04-05 DIAGNOSIS — Z1211 Encounter for screening for malignant neoplasm of colon: Secondary | ICD-10-CM | POA: Diagnosis not present

## 2022-04-05 DIAGNOSIS — Z9049 Acquired absence of other specified parts of digestive tract: Secondary | ICD-10-CM | POA: Diagnosis not present

## 2022-04-05 DIAGNOSIS — K644 Residual hemorrhoidal skin tags: Secondary | ICD-10-CM | POA: Diagnosis not present

## 2022-04-05 DIAGNOSIS — G8929 Other chronic pain: Secondary | ICD-10-CM | POA: Diagnosis not present

## 2022-04-05 DIAGNOSIS — D649 Anemia, unspecified: Secondary | ICD-10-CM | POA: Insufficient documentation

## 2022-04-05 DIAGNOSIS — K5790 Diverticulosis of intestine, part unspecified, without perforation or abscess without bleeding: Secondary | ICD-10-CM

## 2022-04-05 DIAGNOSIS — F418 Other specified anxiety disorders: Secondary | ICD-10-CM | POA: Diagnosis not present

## 2022-04-05 DIAGNOSIS — K5730 Diverticulosis of large intestine without perforation or abscess without bleeding: Secondary | ICD-10-CM | POA: Diagnosis not present

## 2022-04-05 DIAGNOSIS — K649 Unspecified hemorrhoids: Secondary | ICD-10-CM | POA: Diagnosis not present

## 2022-04-05 DIAGNOSIS — Z9081 Acquired absence of spleen: Secondary | ICD-10-CM | POA: Insufficient documentation

## 2022-04-05 DIAGNOSIS — R7303 Prediabetes: Secondary | ICD-10-CM | POA: Insufficient documentation

## 2022-04-05 DIAGNOSIS — K5900 Constipation, unspecified: Secondary | ICD-10-CM | POA: Insufficient documentation

## 2022-04-05 DIAGNOSIS — I10 Essential (primary) hypertension: Secondary | ICD-10-CM | POA: Insufficient documentation

## 2022-04-05 DIAGNOSIS — J449 Chronic obstructive pulmonary disease, unspecified: Secondary | ICD-10-CM | POA: Insufficient documentation

## 2022-04-05 DIAGNOSIS — Z8619 Personal history of other infectious and parasitic diseases: Secondary | ICD-10-CM | POA: Insufficient documentation

## 2022-04-05 DIAGNOSIS — K648 Other hemorrhoids: Secondary | ICD-10-CM | POA: Diagnosis not present

## 2022-04-05 DIAGNOSIS — F17200 Nicotine dependence, unspecified, uncomplicated: Secondary | ICD-10-CM

## 2022-04-05 DIAGNOSIS — K219 Gastro-esophageal reflux disease without esophagitis: Secondary | ICD-10-CM | POA: Insufficient documentation

## 2022-04-05 DIAGNOSIS — Z1212 Encounter for screening for malignant neoplasm of rectum: Secondary | ICD-10-CM

## 2022-04-05 HISTORY — PX: COLONOSCOPY WITH PROPOFOL: SHX5780

## 2022-04-05 LAB — CBC WITH DIFFERENTIAL/PLATELET
Abs Immature Granulocytes: 0.01 10*3/uL (ref 0.00–0.07)
Basophils Absolute: 0 10*3/uL (ref 0.0–0.1)
Basophils Relative: 1 %
Eosinophils Absolute: 0.1 10*3/uL (ref 0.0–0.5)
Eosinophils Relative: 2 %
HCT: 34.5 % — ABNORMAL LOW (ref 39.0–52.0)
Hemoglobin: 11.4 g/dL — ABNORMAL LOW (ref 13.0–17.0)
Immature Granulocytes: 0 %
Lymphocytes Relative: 54 %
Lymphs Abs: 3.3 10*3/uL (ref 0.7–4.0)
MCH: 27.6 pg (ref 26.0–34.0)
MCHC: 33 g/dL (ref 30.0–36.0)
MCV: 83.5 fL (ref 80.0–100.0)
Monocytes Absolute: 0.5 10*3/uL (ref 0.1–1.0)
Monocytes Relative: 9 %
Neutro Abs: 2.1 10*3/uL (ref 1.7–7.7)
Neutrophils Relative %: 34 %
Platelets: UNDETERMINED 10*3/uL (ref 150–400)
RBC: 4.13 MIL/uL — ABNORMAL LOW (ref 4.22–5.81)
RDW: 16.2 % — ABNORMAL HIGH (ref 11.5–15.5)
WBC: 6.1 10*3/uL (ref 4.0–10.5)
nRBC: 0 % (ref 0.0–0.2)

## 2022-04-05 LAB — COMPREHENSIVE METABOLIC PANEL
ALT: 12 U/L (ref 0–44)
AST: 17 U/L (ref 15–41)
Albumin: 3.1 g/dL — ABNORMAL LOW (ref 3.5–5.0)
Alkaline Phosphatase: 73 U/L (ref 38–126)
Anion gap: 12 (ref 5–15)
BUN: 13 mg/dL (ref 8–23)
CO2: 24 mmol/L (ref 22–32)
Calcium: 8.9 mg/dL (ref 8.9–10.3)
Chloride: 100 mmol/L (ref 98–111)
Creatinine, Ser: 0.45 mg/dL — ABNORMAL LOW (ref 0.61–1.24)
GFR, Estimated: >60 mL/min (ref >60)
Glucose, Bld: 92 mg/dL (ref 70–99)
Potassium: 3.6 mmol/L (ref 3.5–5.1)
Sodium: 136 mmol/L (ref 135–145)
Total Bilirubin: 1.1 mg/dL (ref 0.3–1.2)
Total Protein: 6.7 g/dL (ref 6.5–8.1)

## 2022-04-05 LAB — FOLATE: Folate: 25.3 ng/mL (ref >5.9)

## 2022-04-05 LAB — IRON AND TIBC
Iron: 41 ug/dL — ABNORMAL LOW (ref 45–182)
Saturation Ratios: 17 % — ABNORMAL LOW (ref 17.9–39.5)
TIBC: 238 ug/dL — ABNORMAL LOW (ref 250–450)
UIBC: 197 ug/dL

## 2022-04-05 LAB — VITAMIN B12: Vitamin B-12: 827 pg/mL (ref 180–914)

## 2022-04-05 LAB — FERRITIN: Ferritin: 200 ng/mL (ref 24–336)

## 2022-04-05 SURGERY — COLONOSCOPY WITH PROPOFOL
Anesthesia: General

## 2022-04-05 MED ORDER — PROPOFOL 10 MG/ML IV BOLUS
INTRAVENOUS | Status: DC | PRN
  Administered 2022-04-05: 50 mg via INTRAVENOUS
  Administered 2022-04-05 (×3): 30 mg via INTRAVENOUS
  Administered 2022-04-05: 40 mg via INTRAVENOUS

## 2022-04-05 MED ORDER — PROPOFOL 500 MG/50ML IV EMUL
INTRAVENOUS | Status: DC | PRN
  Administered 2022-04-05: 125 ug/kg/min via INTRAVENOUS

## 2022-04-05 MED ORDER — LACTATED RINGERS IV SOLN: INTRAVENOUS | Status: DC | PRN

## 2022-04-05 SURGICAL SUPPLY — 22 items

## 2022-04-05 NOTE — Anesthesia Preprocedure Evaluation (Signed)
Anesthesia Evaluation  Patient identified by MRN, date of birth, ID band Patient awake    Reviewed: Allergy & Precautions, NPO status , Patient's Chart, lab work & pertinent test results  History of Anesthesia Complications Negative for: history of anesthetic complications  Airway Mallampati: III  TM Distance: >3 FB Neck ROM: Full    Dental  (+) Teeth Intact, Dental Advisory Given, Loose,    Pulmonary COPD, Current SmokerPatient did not abstain from smoking.,    breath sounds clear to auscultation       Cardiovascular hypertension,  Rhythm:Regular     Neuro/Psych PSYCHIATRIC DISORDERS Anxiety Depression negative neurological ROS     GI/Hepatic GERD  Medicated and Controlled,(+) Hepatitis -, CTreated  Lab Results      Component                Value               Date                      ALT                      19                  09/26/2021                AST                      18                  09/26/2021                ALKPHOS                  77                  09/26/2021                BILITOT                  0.6                 09/26/2021              Endo/Other  negative endocrine ROS  Renal/GU Lab Results      Component                Value               Date                      CREATININE               0.67                09/26/2021                Musculoskeletal negative musculoskeletal ROS (+)   Abdominal   Peds  Hematology Lab Results      Component                Value               Date                      WBC  16.3 (H)            09/26/2021                HGB                      11.6 (L)            09/26/2021                HCT                      35.2 (L)            09/26/2021                MCV                      80.0                09/26/2021                PLT                      396                 09/26/2021              Anesthesia Other Findings    Reproductive/Obstetrics                             Anesthesia Physical Anesthesia Plan  ASA: 3  Anesthesia Plan:    Post-op Pain Management: Minimal or no pain anticipated   Induction: Intravenous  PONV Risk Score and Plan: 0 and Propofol infusion and Treatment may vary due to age or medical condition  Airway Management Planned: Nasal Cannula and Natural Airway  Additional Equipment: None  Intra-op Plan:   Post-operative Plan:   Informed Consent: I have reviewed the patients History and Physical, chart, labs and discussed the procedure including the risks, benefits and alternatives for the proposed anesthesia with the patient or authorized representative who has indicated his/her understanding and acceptance.     Dental advisory given  Plan Discussed with:   Anesthesia Plan Comments:         Anesthesia Quick Evaluation

## 2022-04-05 NOTE — Interval H&P Note (Signed)
History and Physical Interval Note:  04/05/2022 7:42 AM  [name]  has presented today for surgery, with the diagnosis of positive cologuard, significant weight loss.  The various methods of treatment have been discussed with the patient and family. After consideration of risks, benefits and other options for treatment, the patient has consented to  Procedure(s): COLONOSCOPY WITH PROPOFOL (N/A) as a surgical intervention.  The patient's history has been reviewed, patient examined, no change in status, stable for surgery.  I have reviewed the patient's chart and labs.  Questions were answered to the patient's satisfaction.     Bryan Powell

## 2022-04-05 NOTE — Transfer of Care (Signed)
Immediate Anesthesia Transfer of Care Note  Patient: Bryan Powell  Procedure(s) Performed: COLONOSCOPY WITH PROPOFOL  Patient Location: PACU  Anesthesia Type:General  Level of Consciousness: drowsy  Airway & Oxygen Therapy: Patient Spontanous Breathing  Post-op Assessment: Report given to RN and Post -op Vital signs reviewed and stable  Post vital signs: Reviewed and stable  Last Vitals:  Vitals Value Taken Time  BP 132/79 04/05/22 1045  Temp    Pulse 77 04/05/22 1049  Resp 13 04/05/22 1049  SpO2 98 % 04/05/22 1049  Vitals shown include unvalidated device data.  Last Pain:  Vitals:   04/05/22 0714  TempSrc: Temporal  PainSc: 0-No pain         Complications: No notable events documented.

## 2022-04-05 NOTE — Interval H&P Note (Signed)
History and Physical Interval Note:  04/05/2022 7:41 AM  Bryan Powell  has presented today for surgery, with the diagnosis of positive cologuard, significant weight loss.  The various methods of treatment have been discussed with the patient and family. After consideration of risks, benefits and other options for treatment, the patient has consented to  Procedure(s): COLONOSCOPY WITH PROPOFOL (N/A) as a surgical intervention.  The patient's history has been reviewed, patient examined, no change in status, stable for surgery.  I have reviewed the patient's chart and labs.  Questions were answered to the patient's satisfaction.     Silvano Rusk

## 2022-04-05 NOTE — Discharge Instructions (Addendum)
I saw hemorrhoids and diverticulosis - but no cancer or anything bad. The cleanout was not good but we can exclude bad things like cancer.  I am checking some labs today and will contact you with results and recommendations.  I appreciate the opportunity to care for you. Gatha Mayer, MD, FACG  YOU HAD AN ENDOSCOPIC PROCEDURE TODAY: Refer to the procedure report and other information in the discharge instructions given to you for any specific questions about what was found during the examination. If this information does not answer your questions, please call Dr. Celesta Aver office at [phone] to clarify.   YOU SHOULD EXPECT: Some feelings of bloating in the abdomen. Passage of more gas than usual. Walking can help get rid of the air that was put into your GI tract during the procedure and reduce the bloating. If you had a lower endoscopy (such as a colonoscopy or flexible sigmoidoscopy) you may notice spotting of blood in your stool or on the toilet paper. Some abdominal soreness may be present for a day or two, also.  DIET: Your first meal following the procedure should be a light meal and then it is ok to progress to your normal diet. A half-sandwich or bowl of soup is an example of a good first meal. Heavy or fried foods are harder to digest and may make you feel nauseous or bloated. Drink plenty of fluids but you should avoid alcoholic beverages for 24 hours.   ACTIVITY: Your care partner should take you home directly after the procedure. You should plan to take it easy, moving slowly for the rest of the day. You can resume normal activity the day after the procedure however YOU SHOULD NOT DRIVE, use power tools, machinery or perform tasks that involve climbing or major physical exertion for 24 hours (because of the sedation medicines used during the test).   SYMPTOMS TO REPORT IMMEDIATELY: A gastroenterologist can be reached at any hour. Please call [phone]  for any of the following  symptoms:  Following lower endoscopy (colonoscopy, flexible sigmoidoscopy) Excessive amounts of blood in the stool  Significant tenderness, worsening of abdominal pains  Swelling of the abdomen that is new, acute  Fever of 100 or higher  Following upper endoscopy (EGD, EUS, ERCP, esophageal dilation) Vomiting of blood or coffee ground material  New, significant abdominal pain  New, significant chest pain or pain under the shoulder blades  Painful or persistently difficult swallowing  New shortness of breath  Black, tarry-looking or red, bloody stools  FOLLOW UP:  If any biopsies were taken you will be contacted by phone or by letter within the next 1-3 weeks. Call [phone]  if you have not heard about the biopsies in 3 weeks.  Please also call with any specific questions about appointments or follow up tests.

## 2022-04-05 NOTE — Op Note (Signed)
[facility] Patient Name: Bryan Powell Procedure Date : 04/05/2022 MRN: [id number] Attending MD: Gatha Mayer , MD Date of Birth: 1951-02-27 CSN: [id number] Age: 71 Admit Type: Inpatient Procedure:                Colonoscopy Indications:              Positive Cologuard test Providers:                Gatha Mayer, MD, Velva Harman, RN, Benetta Spar, Technician Referring MD:              Medicines:                General Anesthesia Complications:            No immediate complications. Estimated Blood Loss:     Estimated blood loss: none. Procedure:                Pre-Anesthesia Assessment:                           - Prior to the procedure, a History and Physical                            was performed, and patient medications and                            allergies were reviewed. The patient's tolerance of                            previous anesthesia was also reviewed. The risks                            and benefits of the procedure and the sedation                            options and risks were discussed with the patient.                            All questions were answered, and informed consent                            was obtained. Prior Anticoagulants: The patient has                            taken no previous anticoagulant or antiplatelet                            agents. ASA Grade Assessment: III - A patient with                            severe systemic disease. After reviewing the risks  and benefits, the patient was deemed in                            satisfactory condition to undergo the procedure.                           After obtaining informed consent, the colonoscope                            was passed under direct vision. Throughout the                            procedure, the patient's blood pressure, pulse, and                            oxygen saturations were  monitored continuously. The                            [id number] Olympus colonoscope was                            introduced through the anus and advanced to the the                            terminal ileum, with identification of the                            appendiceal orifice and IC valve. The colonoscopy                            was somewhat difficult due to fair prep. Successful                            completion of the procedure was aided by lavage and                            suctioning. The patient tolerated the procedure                            well. The quality of the bowel preparation was                            fair. The terminal ileum, the ileocecal valve and                            the rectum were photographed. The bowel preparation                            used was Miralax via split dose instruction. Scope In: 10:10:50 AM Scope Out: 10:31:37 AM Scope Withdrawal Time: 0 hours 16 minutes 17 seconds  Total Procedure Duration: 0 hours 20 minutes 47 seconds  Findings:      The perianal and digital rectal examinations were normal.      External and  internal hemorrhoids were found.      A few diverticula were found in the ascending colon.      The exam was otherwise without abnormality on direct and retroflexion       views. Impression:               - Preparation of the colon was fair.                           - External and internal hemorrhoids.                           - Diverticulosis in the ascending colon.                           - The examination was otherwise normal on direct                            and retroflexion views.                           - No specimens collected. Recommendation:           - Patient has a contact number available for                            emergencies. The signs and symptoms of potential                            delayed complications were discussed with the                            patient.  Return to normal activities tomorrow.                            Written discharge instructions were provided to the                            patient.                           - Resume previous diet.                           - Continue present medications.                           - Checking CBC, CMET, iron studies, B12 and folate                            today                           Will arrange office follow-up after I review those                           This exam did not exclude small polyps well so was  not an adequate screening test. Cologuard + could                            have been from hemorrhoids.                           Colonoscopy is challenging in him - difficult IV                            access - will discuss timing of a repeat exam in                            person at f/u - he would need different/extra prep                           He required inhalation anesthesia with mask and                            airway support as did not respond to propofol                            dosing as expected.                           He also had clonazepam prior to procedure due to                            anticipatory anxiety and was sedated for a while so                            we delayed procedure until he was more awake. Procedure Code(s):        --- Professional ---                           [phone], Colonoscopy, flexible; diagnostic, including                            collection of specimen(s) by brushing or washing,                            when performed (separate procedure) Diagnosis Code(s):        --- Professional ---                           K64.8, Other hemorrhoids                           R19.5, Other fecal abnormalities                           K57.30, Diverticulosis of large intestine without                            perforation or abscess without bleeding CPT copyright 2019 [employer].  All rights reserved. The codes documented in this report are preliminary and upon coder review may  be revised to meet current compliance requirements. Gatha Mayer, MD 04/05/2022 10:56:32 AM This report has been signed electronically. Number of Addenda: 0

## 2022-04-06 NOTE — Addendum Note (Signed)
Addendum  created 04/06/22 0932 by Oleta Mouse, MD   Clinical Note Signed, SmartForm saved

## 2022-04-06 NOTE — Anesthesia Postprocedure Evaluation (Addendum)
Anesthesia Post Note  Patient: Bryan Powell  Procedure(s) Performed: COLONOSCOPY WITH PROPOFOL     Patient location during evaluation: PACU Anesthesia Type: General Level of consciousness: awake and alert Pain management: pain level controlled Vital Signs Assessment: post-procedure vital signs reviewed and stable Respiratory status: spontaneous breathing, nonlabored ventilation and respiratory function stable Cardiovascular status: stable and blood pressure returned to baseline Postop Assessment: no apparent nausea or vomiting Anesthetic complications: no   No notable events documented.  Last Vitals:  Vitals:   04/05/22 1115 04/05/22 1130  BP: (!) 146/78   Pulse: 77 81  Resp: 15   Temp: (!) 36.4 C   SpO2: 97%     Last Pain:  Vitals:   04/05/22 1045  TempSrc:   PainSc: Asleep                 Alannis Hsia

## 2022-04-07 ENCOUNTER — Encounter (HOSPITAL_COMMUNITY): Payer: Self-pay | Admitting: Internal Medicine

## 2022-04-14 ENCOUNTER — Telehealth: Payer: Self-pay | Admitting: Internal Medicine

## 2022-04-14 NOTE — Telephone Encounter (Signed)
Both patient and wife sent me letters.  Patient had colonoscopy that was unrevealing recently.  He has unintentional weight loss and needs a CT abdomen and pelvis with contrast.  Please contact wife and arrange this.  He did have recent labs.

## 2022-04-15 ENCOUNTER — Other Ambulatory Visit: Payer: Self-pay

## 2022-04-15 DIAGNOSIS — R634 Abnormal weight loss: Secondary | ICD-10-CM

## 2022-04-15 NOTE — Telephone Encounter (Signed)
Order for CT scan entered:  Staff message sent to Rhys Martini and April Pait to schedule: Left message for pt wife Judson Roch to call back

## 2022-04-18 NOTE — Telephone Encounter (Unsigned)
Left message for pt wife Judson Roch  to call back

## 2022-04-19 NOTE — Telephone Encounter (Signed)
Pt was notified of Dr. Carlean Purl recommendations: Pt stated that he has already been scheduled for the CT scan on 04/28/2022 at 10:30 at [facility] Pt verbalized understanding with all questions answered.

## 2022-04-28 ENCOUNTER — Ambulatory Visit (HOSPITAL_COMMUNITY): Admission: RE | Admit: 2022-04-28 | Payer: Medicare HMO | Source: Ambulatory Visit

## 2022-04-28 ENCOUNTER — Ambulatory Visit (HOSPITAL_COMMUNITY)
Admission: RE | Admit: 2022-04-28 | Discharge: 2022-04-28 | Disposition: A | Discharge status: Home / Self Care | Payer: Medicare HMO | Source: Ambulatory Visit | Attending: Internal Medicine | Admitting: Internal Medicine

## 2022-04-28 DIAGNOSIS — R634 Abnormal weight loss: Secondary | ICD-10-CM | POA: Insufficient documentation

## 2022-04-28 MED ORDER — IOHEXOL 300 MG/ML  SOLN
100.0000 mL | Freq: Once | INTRAMUSCULAR | Status: AC | PRN
  Administered 2022-04-28: 100 mL via INTRAVENOUS

## 2022-04-29 ENCOUNTER — Telehealth: Payer: Self-pay | Admitting: Internal Medicine

## 2022-04-29 NOTE — Telephone Encounter (Signed)
Spoke to patient about CT scan abdomen and pelvis results.  There is lymphadenopathy in the iliac areas retroperitoneal and the gluteal areas.  The areas in the buttocks could be abscesses.  The patient does not have symptoms.  There is a history of lower extremity cellulitis that was severe in the past and he wears support hose for that.  It is quite possible that what we are seeing is residual changes from that.  He does report that his appetite is improving and he is now moving his bowels better.  He has been under a lot of stress at home and I told him I think that may have had a role in his anorexia and weight loss issues.  He starts with multiple questions about vaccinations because he is status post splenectomy.  His vaccinations look up-to-date but he is quoting information that says he should have repeat vaccines.  I explained to him that that is not my area of expertise.  He was also complaining of some green nasal discharge and asking for antibiotics and I deferred and suggested he seek help from primary care versus in urgent care.   I do want him to see me in the next few weeks and a work in spot so I can examine for adenopathy and we will determine any next steps.  He could need a repeat CT scan at some point in the future to follow-up on this lymphadenopathy.  He can have an 1130 or 350 appointment at some point in late November or early December within 1 month would be reasonable.

## 2022-05-02 NOTE — Telephone Encounter (Signed)
Pt was made of aware of Dr. Leone Payor recommendations to schedule an office visit: Pt was scheduled for an office visit on 05/19/2022 at 11:30 with Dr. Leone Payor :  Pt made aware: Pt verbalized understanding with all questions answered.

## 2022-05-02 NOTE — Telephone Encounter (Signed)
Please see note below: Just FYI  

## 2022-05-02 NOTE — Telephone Encounter (Signed)
Left message for pt to call back  °

## 2022-05-02 NOTE — Telephone Encounter (Signed)
Patient is returning your call.  

## 2022-05-19 ENCOUNTER — Encounter: Payer: Self-pay | Admitting: Internal Medicine

## 2022-05-19 ENCOUNTER — Ambulatory Visit (INDEPENDENT_AMBULATORY_CARE_PROVIDER_SITE_OTHER): Payer: Medicare HMO | Admitting: Internal Medicine

## 2022-05-19 VITALS — BP 132/82 | HR 87 | Ht 67.0 in | Wt 140.0 lb

## 2022-05-19 DIAGNOSIS — K219 Gastro-esophageal reflux disease without esophagitis: Secondary | ICD-10-CM

## 2022-05-19 DIAGNOSIS — K5900 Constipation, unspecified: Secondary | ICD-10-CM

## 2022-05-19 DIAGNOSIS — Z789 Other specified health status: Secondary | ICD-10-CM

## 2022-05-19 DIAGNOSIS — F439 Reaction to severe stress, unspecified: Secondary | ICD-10-CM | POA: Diagnosis not present

## 2022-05-19 DIAGNOSIS — R634 Abnormal weight loss: Secondary | ICD-10-CM

## 2022-05-19 MED ORDER — ESOMEPRAZOLE MAGNESIUM 40 MG PO CPDR: 40.0000 mg | DELAYED_RELEASE_CAPSULE | Freq: Every day | ORAL | 3 refills | Status: AC

## 2022-05-19 NOTE — Progress Notes (Signed)
Bryan Powell 71 y.o. 1950-08-17 [id number]  Assessment & Plan:   Encounter Diagnoses  Name Primary?   Unintentional weight loss Yes   Situational stress    Constipation    Gastroesophageal reflux disease, unspecified whether esophagitis present    Difficult intravenous access    Overall better continue current theray. We had a good conversation today - suspect stress has been driver of weight loss. MiraALx qd for constipation. Consider EGD pending clinical course.  07/29/2022 f/u visit me     Subjective:   Chief Complaint: f/u weight loss and CT scan  HPI 71 yo AA man w/ weight loss issues and unrevealing colonoscopy for + Cologuard recently. CT scan for weight loss as below:   CT abdomen pelvis with contrast 04/28/2022 IMPRESSION: New mild bilateral inguinal, iliac, and left para-aortic lymphadenopathy. Differential diagnosis includes lymphoproliferative disorder and inflammatory/reactive etiologies.   Increased soft tissue density and multiple small rim enhancing lesions throughout the subcutaneous tissues of the posterior pelvic wall and bilateral buttocks. This could be due to lymphadenopathy/lymphoproliferative disorder or cellulitis with small abscesses. Recommend correlation with physical exam findings in this region.   Large stool burden noted; recommend clinical correlation for possible constipation.    He has hx cellulitis and scarring - here for f/u exam given CT findings. He is improving - appetite better, weight increasing since the summer though down some again Has been under a lot of family stress - daughter with unsupportive partner and she has 3 kids. Doesn't always agree with his wife on family issues.   Has had constipation issues and now sometimes urgent defecation.Using MiraLax intermittently.   Wt Readings from Last 3 Encounters:  05/19/22 140 lb (63.5 kg)  04/05/22 145 lb (65.8 kg)  03/23/22 134 lb (60.8 kg)    Phone call  04/29/2022 spoke to patient about CT scan abdomen and pelvis results.  There is lymphadenopathy in the iliac areas retroperitoneal and the gluteal areas.  The areas in the buttocks could be abscesses.  The patient does not have symptoms.  There is a history of lower extremity cellulitis that was severe in the past and he wears support hose for that.  It is quite possible that what we are seeing is residual changes from that.   He does report that his appetite is improving and he is now moving his bowels better.  He has been under a lot of stress at home and I told him I think that may have had a role in his anorexia and weight loss issues.   He starts with multiple questions about vaccinations because he is status post splenectomy.  His vaccinations look up-to-date but he is quoting information that says he should have repeat vaccines.  I explained to him that that is not my area of expertise.   He was also complaining of some green nasal discharge and asking for antibiotics and I deferred and suggested he seek help from primary care versus in urgent care.     I do want him to see me in the next few weeks and a work in spot so I can examine for adenopathy and we will determine any next steps.  He could need a repeat CT scan at some point in the future to follow-up on this lymphadenopathy.    Wt Readings from Last 3 Encounters:  05/19/22 140 lb (63.5 kg)  04/05/22 145 lb (65.8 kg)  03/23/22 134 lb (60.8 kg)  202 pounds December 2020 163 pounds June 2018  150 pounds April 2023 No Known Allergies Current Meds  Medication Sig   ACCU-CHEK GUIDE test strip USE DAILY TO CHECK BLOOD SUGAR LEVEL. R73. 09   Accu-Chek Softclix Lancets lancets SMARTSIG:Topical   bismuth subsalicylate (PEPTO BISMOL) 262 MG/15ML suspension Take 30 mLs by mouth every 6 (six) hours as needed for indigestion.   Blood Glucose Monitoring Suppl (ACCU-CHEK GUIDE ME) w/Device KIT USE DAILY TO CHECK BLOOD SUGAR LEVEL.    cetirizine (ZYRTEC) 10 MG tablet Take 10 mg by mouth daily.   ciclopirox (PENLAC) 8 % solution Apply 1 Application topically See admin instructions. Five time week   Cyanocobalamin (VITAMIN B-12 PO) Take 3,000 mcg by mouth daily.   cyclobenzaprine (FLEXERIL) 10 MG tablet Take 10 mg by mouth 2 (two) times a week.   esomeprazole (NEXIUM) 40 MG capsule Take 1 capsule (40 mg total) by mouth daily before breakfast.   feeding supplement, ENSURE ENLIVE, (ENSURE ENLIVE) LIQD Take 237 mLs by mouth 2 (two) times daily between meals. (Patient taking differently: Take 237 mLs by mouth 4 (four) times a week.)   folic acid (FOLVITE) 1 MG tablet Take 1 tablet (1 mg total) by mouth daily. (Patient taking differently: Take 1 mg by mouth 2 (two) times a week.)   gabapentin (NEURONTIN) 800 MG tablet Take 800 mg by mouth 2 (two) times a week.   loratadine (CLARITIN) 10 MG tablet Take 10 mg by mouth daily.   Magnesium 250 MG TABS Take 250 mg by mouth 2 (two) times a week.   meloxicam (MOBIC) 7.5 MG tablet Take 7.5 mg by mouth 2 (two) times a week.   Multiple Vitamin (MULTIVITAMIN WITH MINERALS) TABS tablet Take 1 tablet by mouth daily. For low vitamin (Patient taking differently: Take 1 tablet by mouth 2 (two) times a week. For low vitamin)   Multiple Vitamins-Minerals (ZINC PO) Take 1 tablet by mouth daily at 6 (six) AM.   Oxycodone HCl 10 MG TABS Take 1 tablet by mouth 6 (six) times daily.   Probiotic Product (PROBIOTIC DAILY PO) Take 1 tablet by mouth daily at 6 (six) AM.   tiZANidine (ZANAFLEX) 4 MG tablet Take 4 mg by mouth once a week.   traZODone (DESYREL) 150 MG tablet Take 150 mg by mouth 2 (two) times a week. At bedtime   valACYclovir (VALTREX) 500 MG tablet Take 500 mg by mouth as needed (cold sores).   [DISCONTINUED] esomeprazole (NEXIUM) 20 MG capsule Take 1 capsule (20 mg total) by mouth daily at 12 noon. For acid reflux (Patient taking differently: Take 20 mg by mouth 3 (three) times a week. For acid  reflux)   Past Medical History:  Diagnosis Date   Alcohol abuse    Anxiety    Depression    ED (erectile dysfunction)    Genital herpes    GERD (gastroesophageal reflux disease)    Hep C w/o coma, chronic (HCC)    Heroin abuse ([place])    Hypertension    Lower extremity venous stasis    Poor venous access    hx. of Heroin, Alcohol abuse "extreme difficult vein access"   Reflux    Past Surgical History:  Procedure Laterality Date   CHOLECYSTECTOMY     COLONOSCOPY WITH PROPOFOL N/A 04/05/2022   Procedure: COLONOSCOPY WITH PROPOFOL;  Surgeon: Gatha Mayer, MD;  Location: [place];  Service: Gastroenterology;  Laterality: N/A;   I & D EXTREMITY  06/09/2012   Procedure: IRRIGATION AND DEBRIDEMENT EXTREMITY;  Surgeon: Linna Hoff,  MD;  Location: [place];  Service: Orthopedics;  Laterality: Left;   LAPAROTOMY N/A 08/01/2016   Procedure: EXPLORATORY LAPAROTOMY;  Surgeon: Clovis Riley, MD;  Location: [place];  Service: General;  Laterality: N/A;   ORBITAL FRACTURE SURGERY     ORIF TIBIA PLATEAU Right 11/14/2018   Procedure: OPEN REDUCTION INTERNAL FIXATION (ORIF) TIBIAL PLATEAU;  Surgeon: Shona Needles, MD;  Location: [place];  Service: Orthopedics;  Laterality: Right;   SPLENECTOMY, TOTAL N/A 08/01/2016   Procedure: SPLENECTOMY;  Surgeon: Georganna Skeans, MD;  Location: [facility] OR;  Service: General;  Laterality: N/A;   TONSILLECTOMY     Social History   Social History Narrative   Not on file   family history includes Heart disease in his father; Stroke in his mother.   Review of Systems As above  Objective:   Physical Exam BP 132/82   Pulse 87   Ht _0  (1.702 m)   Wt 140 lb (63.5 kg)   BMI 21.93 kg/m  NAD  Buttocks and LE's with extensive scarring from prior cellulitis  No groin adenopathy   Data reviewed - see HPI  Time spent > 30 mins

## 2022-05-19 NOTE — Patient Instructions (Signed)
Take a dose of miralx daily.  If you don't have a bowel movement in 2-3 days take an over the counter Senokot or Dulcolax.   We have sent the following medications to your pharmacy for you to pick up at your convenience: Nexium   I appreciate the opportunity to care for you. Stan Head, MD

## 2022-07-27 ENCOUNTER — Ambulatory Visit: Payer: Medicare HMO | Admitting: Internal Medicine

## 2022-07-29 ENCOUNTER — Ambulatory Visit (INDEPENDENT_AMBULATORY_CARE_PROVIDER_SITE_OTHER): Payer: Medicare HMO | Admitting: Internal Medicine

## 2022-07-29 ENCOUNTER — Encounter: Payer: Self-pay | Admitting: Internal Medicine

## 2022-07-29 VITALS — BP 104/68 | HR 71 | Ht 67.0 in | Wt 149.0 lb

## 2022-07-29 DIAGNOSIS — R634 Abnormal weight loss: Secondary | ICD-10-CM | POA: Diagnosis not present

## 2022-07-29 DIAGNOSIS — K5909 Other constipation: Secondary | ICD-10-CM

## 2022-07-29 MED ORDER — LUBIPROSTONE 24 MCG PO CAPS: 24.0000 ug | ORAL_CAPSULE | Freq: Two times a day (BID) | ORAL | 2 refills | Status: DC

## 2022-07-29 NOTE — Patient Instructions (Signed)
_______________________________________________________  If your blood pressure at your visit was 140/90 or greater, please contact your primary care physician to follow up on this.  _______________________________________________________  If you are age 73 or older, your body mass index should be between 23-30. Your Body mass index is 23.34 kg/m. If this is out of the aforementioned range listed, please consider follow up with your Primary Care Provider.  If you are age 80 or younger, your body mass index should be between 19-25. Your Body mass index is 23.34 kg/m. If this is out of the aformentioned range listed, please consider follow up with your Primary Care Provider.   ________________________________________________________  The [facility] providers would like to encourage you to use [facility] to communicate with providers for non-urgent requests or questions.  Due to long hold times on the telephone, sending your provider a message by [facility] may be a faster and more efficient way to get a response.  Please allow 48 business hours for a response.  Please remember that this is for non-urgent requests.  _______________________________________________________  We have sent the following medications to your pharmacy for you to pick up at your convenience: Amitiza  Prior to starting the [place] do a bowel purge as follows:  Dr Carlean Purl recommends that you complete a bowel purge (to clean out your bowels). Please do the following: Purchase a bottle of Miralax over the counter as well as a box of 5 mg dulcolax tablets. Take 4 dulcolax tablets. Wait 1 hour. You will then drink 6-8 capfuls of Miralax mixed in an adequate amount of water/juice/gatorade (you may choose which of these liquids to drink) over the next 2-3 hours. You should expect results within 1 to 6 hours after completing the bowel purge.  Once you start the Amitiza please stop the Miralax and Dulcolax.   I appreciate the  opportunity to care for you. Ronney Lion, [facility]

## 2022-07-29 NOTE — Progress Notes (Signed)
Bryan Powell 72 y.o. [birth year] [id number]  Assessment & Plan:   Encounter Diagnoses  Name Primary?   Chronic constipation Yes   Unintentional weight loss - resolved     He is better overall.  Will try Amitiza 24 mcg twice daily for constipation instead of MiraLAX Dulcolax combo and he will follow-up in 2 months. MiraLAX purge prior to starting Amitiza  Subjective:   Chief Complaint: Constipation  HPI Patient returns for follow-up of constipation.  He had problems with unintentional weight loss that seem to have been related to situational stress and appetite change.  He has recovered from that.  He still has constipation and moves his bowels once or twice a week despite taking MiraLAX twice a day and Dulcolax every few days.  He is interested in another therapy. Wt Readings from Last 3 Encounters:  07/29/22 149 lb (67.6 kg)  05/19/22 140 lb (63.5 kg)  04/05/22 145 lb (65.8 kg)     No Known Allergies Current Meds  Medication Sig   ACCU-CHEK GUIDE test strip USE DAILY TO CHECK BLOOD SUGAR LEVEL. R73. 09   Accu-Chek Softclix Lancets lancets SMARTSIG:Topical   bismuth subsalicylate (PEPTO BISMOL) 262 MG/15ML suspension Take 30 mLs by mouth every 6 (six) hours as needed for indigestion.   Blood Glucose Monitoring Suppl (ACCU-CHEK GUIDE ME) w/Device KIT USE DAILY TO CHECK BLOOD SUGAR LEVEL.   cetirizine (ZYRTEC) 10 MG tablet Take 10 mg by mouth daily.   ciclopirox (PENLAC) 8 % solution Apply 1 Application topically See admin instructions. Five time week   Cyanocobalamin (VITAMIN B-12 PO) Take 3,000 mcg by mouth daily.   cyclobenzaprine (FLEXERIL) 10 MG tablet Take 10 mg by mouth 2 (two) times a week.   esomeprazole (NEXIUM) 40 MG capsule Take 1 capsule (40 mg total) by mouth daily before breakfast.   feeding supplement, ENSURE ENLIVE, (ENSURE ENLIVE) LIQD Take 237 mLs by mouth 2 (two) times daily between meals. (Patient taking differently: Take 237 mLs by mouth 4 (four)  times a week.)   folic acid (FOLVITE) 1 MG tablet Take 1 tablet (1 mg total) by mouth daily. (Patient taking differently: Take 1 mg by mouth 2 (two) times a week.)   gabapentin (NEURONTIN) 800 MG tablet Take 800 mg by mouth 2 (two) times a week.   loratadine (CLARITIN) 10 MG tablet Take 10 mg by mouth daily.   lubiprostone (AMITIZA) 24 MCG capsule Take 1 capsule (24 mcg total) by mouth 2 (two) times daily with a meal.   Magnesium 250 MG TABS Take 250 mg by mouth 2 (two) times a week.   meloxicam (MOBIC) 7.5 MG tablet Take 7.5 mg by mouth 2 (two) times a week.   Multiple Vitamin (MULTIVITAMIN WITH MINERALS) TABS tablet Take 1 tablet by mouth daily. For low vitamin (Patient taking differently: Take 1 tablet by mouth 2 (two) times a week. For low vitamin)   Multiple Vitamins-Minerals (ZINC PO) Take 1 tablet by mouth daily at 6 (six) AM.   Oxycodone HCl 10 MG TABS Take 1 tablet by mouth 6 (six) times daily.   Probiotic Product (PROBIOTIC DAILY PO) Take 1 tablet by mouth daily at 6 (six) AM.   tiZANidine (ZANAFLEX) 4 MG tablet Take 4 mg by mouth once a week.   traZODone (DESYREL) 150 MG tablet Take 150 mg by mouth 2 (two) times a week. At bedtime   valACYclovir (VALTREX) 500 MG tablet Take 500 mg by mouth as needed (cold sores).   Past Medical  History:  Diagnosis Date   Alcohol abuse    Anxiety    Depression    ED (erectile dysfunction)    Genital herpes    GERD (gastroesophageal reflux disease)    Hep C w/o coma, chronic (HCC)    Heroin abuse ([place])    Hypertension    Lower extremity venous stasis    Poor venous access    hx. of Heroin, Alcohol abuse "extreme difficult vein access"   Reflux    Past Surgical History:  Procedure Laterality Date   CHOLECYSTECTOMY     COLONOSCOPY WITH PROPOFOL N/A 04/05/2022   Procedure: COLONOSCOPY WITH PROPOFOL;  Surgeon: Gatha Mayer, MD;  Location: [place];  Service: Gastroenterology;  Laterality: N/A;   I & D EXTREMITY  06/09/2012    Procedure: IRRIGATION AND DEBRIDEMENT EXTREMITY;  Surgeon: Linna Hoff, MD;  Location: [place];  Service: Orthopedics;  Laterality: Left;   LAPAROTOMY N/A 08/01/2016   Procedure: EXPLORATORY LAPAROTOMY;  Surgeon: Clovis Riley, MD;  Location: [place];  Service: General;  Laterality: N/A;   ORBITAL FRACTURE SURGERY     ORIF TIBIA PLATEAU Right 11/14/2018   Procedure: OPEN REDUCTION INTERNAL FIXATION (ORIF) TIBIAL PLATEAU;  Surgeon: Shona Needles, MD;  Location: [place];  Service: Orthopedics;  Laterality: Right;   SPLENECTOMY, TOTAL N/A 08/01/2016   Procedure: SPLENECTOMY;  Surgeon: Georganna Skeans, MD;  Location: [facility] OR;  Service: General;  Laterality: N/A;   TONSILLECTOMY     Social History   Social History Narrative   Married with children retired   Smoker of cigarettes former heroin user, occasional alcohol   family history includes Heart disease in his father; Stroke in his mother.   Review of Systems As above  Objective:   Physical Exam BP 104/68   Pulse 71   Ht 5' 7"$  (1.702 m)   Wt 149 lb (67.6 kg)   BMI 23.34 kg/m  NAD Abd soft NT

## 2022-09-29 DIAGNOSIS — M129 Arthropathy, unspecified: Secondary | ICD-10-CM | POA: Diagnosis not present

## 2022-09-29 DIAGNOSIS — I10 Essential (primary) hypertension: Secondary | ICD-10-CM | POA: Diagnosis not present

## 2022-09-29 DIAGNOSIS — G8929 Other chronic pain: Secondary | ICD-10-CM | POA: Diagnosis not present

## 2022-09-29 DIAGNOSIS — R1084 Generalized abdominal pain: Secondary | ICD-10-CM | POA: Diagnosis not present

## 2022-09-29 DIAGNOSIS — M5416 Radiculopathy, lumbar region: Secondary | ICD-10-CM | POA: Diagnosis not present

## 2022-09-29 DIAGNOSIS — Z87891 Personal history of nicotine dependence: Secondary | ICD-10-CM | POA: Diagnosis not present

## 2022-09-29 DIAGNOSIS — Z79899 Other long term (current) drug therapy: Secondary | ICD-10-CM | POA: Diagnosis not present

## 2022-09-29 DIAGNOSIS — F17210 Nicotine dependence, cigarettes, uncomplicated: Secondary | ICD-10-CM | POA: Diagnosis not present

## 2022-09-29 DIAGNOSIS — Z9181 History of falling: Secondary | ICD-10-CM | POA: Diagnosis not present

## 2022-09-29 DIAGNOSIS — E559 Vitamin D deficiency, unspecified: Secondary | ICD-10-CM | POA: Diagnosis not present

## 2022-10-03 DIAGNOSIS — Z79899 Other long term (current) drug therapy: Secondary | ICD-10-CM | POA: Diagnosis not present

## 2022-10-12 ENCOUNTER — Telehealth: Payer: Self-pay | Admitting: Internal Medicine

## 2022-10-12 ENCOUNTER — Ambulatory Visit: Payer: Medicare HMO | Admitting: Internal Medicine

## 2022-10-12 NOTE — Telephone Encounter (Signed)
Patients wife called wanting to reschedule the appt that he no showed for today. Seeking advise.

## 2022-10-12 NOTE — Telephone Encounter (Signed)
Left message for pt wife to call back

## 2022-10-13 DIAGNOSIS — H40023 Open angle with borderline findings, high risk, bilateral: Secondary | ICD-10-CM | POA: Diagnosis not present

## 2022-10-13 DIAGNOSIS — H35033 Hypertensive retinopathy, bilateral: Secondary | ICD-10-CM | POA: Diagnosis not present

## 2022-10-13 DIAGNOSIS — H0101A Ulcerative blepharitis right eye, upper and lower eyelids: Secondary | ICD-10-CM | POA: Diagnosis not present

## 2022-10-13 DIAGNOSIS — H353131 Nonexudative age-related macular degeneration, bilateral, early dry stage: Secondary | ICD-10-CM | POA: Diagnosis not present

## 2022-10-13 NOTE — Telephone Encounter (Signed)
Left message for wife to call back.

## 2022-10-14 NOTE — Telephone Encounter (Signed)
Unable to reach patient's wife x 3. Left detailed message for wife to return call when she is ready to schedule patient for follow up appointment.

## 2022-10-26 ENCOUNTER — Other Ambulatory Visit (HOSPITAL_COMMUNITY): Admit: 2022-10-26 | Payer: Medicare HMO | Source: Ambulatory Visit

## 2022-10-26 ENCOUNTER — Other Ambulatory Visit (HOSPITAL_COMMUNITY)
Admit: 2022-10-26 | Discharge: 2022-10-26 | Disposition: A | Discharge status: Home / Self Care | Payer: Medicare HMO | Source: Ambulatory Visit | Attending: Internal Medicine | Admitting: Internal Medicine

## 2022-10-26 DIAGNOSIS — J449 Chronic obstructive pulmonary disease, unspecified: Secondary | ICD-10-CM | POA: Insufficient documentation

## 2022-10-26 DIAGNOSIS — A6000 Herpesviral infection of urogenital system, unspecified: Secondary | ICD-10-CM | POA: Insufficient documentation

## 2022-10-26 DIAGNOSIS — K219 Gastro-esophageal reflux disease without esophagitis: Secondary | ICD-10-CM | POA: Insufficient documentation

## 2022-10-26 DIAGNOSIS — N529 Male erectile dysfunction, unspecified: Secondary | ICD-10-CM | POA: Insufficient documentation

## 2022-10-26 DIAGNOSIS — J309 Allergic rhinitis, unspecified: Secondary | ICD-10-CM | POA: Insufficient documentation

## 2022-10-26 DIAGNOSIS — M545 Low back pain, unspecified: Secondary | ICD-10-CM | POA: Diagnosis not present

## 2022-10-26 DIAGNOSIS — E7800 Pure hypercholesterolemia, unspecified: Secondary | ICD-10-CM | POA: Diagnosis not present

## 2022-10-26 DIAGNOSIS — B182 Chronic viral hepatitis C: Secondary | ICD-10-CM | POA: Insufficient documentation

## 2022-10-26 DIAGNOSIS — R195 Other fecal abnormalities: Secondary | ICD-10-CM | POA: Diagnosis not present

## 2022-10-26 DIAGNOSIS — F411 Generalized anxiety disorder: Secondary | ICD-10-CM | POA: Diagnosis not present

## 2022-10-26 DIAGNOSIS — F339 Major depressive disorder, recurrent, unspecified: Secondary | ICD-10-CM | POA: Diagnosis not present

## 2022-10-26 DIAGNOSIS — R7303 Prediabetes: Secondary | ICD-10-CM | POA: Diagnosis not present

## 2022-10-26 DIAGNOSIS — F17200 Nicotine dependence, unspecified, uncomplicated: Secondary | ICD-10-CM | POA: Insufficient documentation

## 2022-10-26 DIAGNOSIS — F1120 Opioid dependence, uncomplicated: Secondary | ICD-10-CM | POA: Insufficient documentation

## 2022-10-26 LAB — CBC WITH DIFFERENTIAL/PLATELET
Abs Immature Granulocytes: 0.02 10*3/uL (ref 0.00–0.07)
Basophils Absolute: 0 10*3/uL (ref 0.0–0.1)
Basophils Relative: 0 %
Eosinophils Absolute: 0.3 10*3/uL (ref 0.0–0.5)
Eosinophils Relative: 3 %
HCT: 38.7 % — ABNORMAL LOW (ref 39.0–52.0)
Hemoglobin: 12.1 g/dL — ABNORMAL LOW (ref 13.0–17.0)
Immature Granulocytes: 0 %
Lymphocytes Relative: 48 %
Lymphs Abs: 4.4 10*3/uL — ABNORMAL HIGH (ref 0.7–4.0)
MCH: 25.6 pg — ABNORMAL LOW (ref 26.0–34.0)
MCHC: 31.3 g/dL (ref 30.0–36.0)
MCV: 81.8 fL (ref 80.0–100.0)
Monocytes Absolute: 1 10*3/uL (ref 0.1–1.0)
Monocytes Relative: 11 %
Neutro Abs: 3.5 10*3/uL (ref 1.7–7.7)
Neutrophils Relative %: 38 %
Platelets: 329 10*3/uL (ref 150–400)
RBC: 4.73 MIL/uL (ref 4.22–5.81)
RDW: 16.2 % — ABNORMAL HIGH (ref 11.5–15.5)
WBC: 9.2 10*3/uL (ref 4.0–10.5)
nRBC: 0 % (ref 0.0–0.2)

## 2022-10-26 LAB — COMPREHENSIVE METABOLIC PANEL
ALT: 15 U/L (ref 0–44)
AST: 16 U/L (ref 15–41)
Albumin: 3.6 g/dL (ref 3.5–5.0)
Alkaline Phosphatase: 94 U/L (ref 38–126)
Anion gap: 13 (ref 5–15)
BUN: 10 mg/dL (ref 8–23)
CO2: 25 mmol/L (ref 22–32)
Calcium: 9.3 mg/dL (ref 8.9–10.3)
Chloride: 98 mmol/L (ref 98–111)
Creatinine, Ser: 0.55 mg/dL — ABNORMAL LOW (ref 0.61–1.24)
GFR, Estimated: >60 mL/min (ref >60)
Glucose, Bld: 87 mg/dL (ref 70–99)
Potassium: 3.9 mmol/L (ref 3.5–5.1)
Sodium: 136 mmol/L (ref 135–145)
Total Bilirubin: 0.8 mg/dL (ref 0.3–1.2)
Total Protein: 8 g/dL (ref 6.5–8.1)

## 2022-10-26 LAB — RAPID URINE DRUG SCREEN, HOSP PERFORMED
Amphetamines: NOT DETECTED
Barbiturates: NOT DETECTED
Benzodiazepines: NOT DETECTED
Cocaine: NOT DETECTED
Opiates: POSITIVE — AB
Tetrahydrocannabinol: NOT DETECTED

## 2022-10-26 LAB — URIC ACID: Uric Acid, Serum: 4 mg/dL (ref 3.7–8.6)

## 2022-10-26 LAB — HEMOGLOBIN A1C
Hgb A1c MFr Bld: 6.1 % — ABNORMAL HIGH (ref 4.8–5.6)
Mean Plasma Glucose: 128.37 mg/dL

## 2022-10-26 LAB — C-REACTIVE PROTEIN: CRP: 6 mg/dL — ABNORMAL HIGH (ref <1.0)

## 2022-10-26 LAB — MAGNESIUM: Magnesium: 1.7 mg/dL (ref 1.7–2.4)

## 2022-10-27 DIAGNOSIS — Z9181 History of falling: Secondary | ICD-10-CM | POA: Diagnosis not present

## 2022-10-27 DIAGNOSIS — M5416 Radiculopathy, lumbar region: Secondary | ICD-10-CM | POA: Diagnosis not present

## 2022-10-27 DIAGNOSIS — Z6820 Body mass index (BMI) 20.0-20.9, adult: Secondary | ICD-10-CM | POA: Diagnosis not present

## 2022-10-27 DIAGNOSIS — F17210 Nicotine dependence, cigarettes, uncomplicated: Secondary | ICD-10-CM | POA: Diagnosis not present

## 2022-10-27 DIAGNOSIS — R1084 Generalized abdominal pain: Secondary | ICD-10-CM | POA: Diagnosis not present

## 2022-10-27 DIAGNOSIS — Z79899 Other long term (current) drug therapy: Secondary | ICD-10-CM | POA: Diagnosis not present

## 2022-10-27 DIAGNOSIS — G8929 Other chronic pain: Secondary | ICD-10-CM | POA: Diagnosis not present

## 2022-10-27 DIAGNOSIS — I10 Essential (primary) hypertension: Secondary | ICD-10-CM | POA: Diagnosis not present

## 2022-10-28 LAB — HEPATITIS C ANTIBODY: HCV Ab: REACTIVE — AB

## 2022-10-28 LAB — RHEUMATOID FACTOR: Rheumatoid fact SerPl-aCnc: <10 IU/mL (ref <14.0)

## 2022-10-28 LAB — ANA W/REFLEX: Anti Nuclear Antibody (ANA): NEGATIVE

## 2022-10-31 DIAGNOSIS — Z79899 Other long term (current) drug therapy: Secondary | ICD-10-CM | POA: Diagnosis not present

## 2022-11-01 LAB — MISC LABCORP TEST (SEND OUT): Labcorp test code: 520008

## 2022-11-03 LAB — VITAMIN D 25 HYDROXY (VIT D DEFICIENCY, FRACTURES)

## 2022-11-16 LAB — HEPATITIS B SURFACE ANTIGEN

## 2022-11-23 DIAGNOSIS — M5416 Radiculopathy, lumbar region: Secondary | ICD-10-CM | POA: Diagnosis not present

## 2022-11-23 DIAGNOSIS — Z6820 Body mass index (BMI) 20.0-20.9, adult: Secondary | ICD-10-CM | POA: Diagnosis not present

## 2022-11-23 DIAGNOSIS — Z79899 Other long term (current) drug therapy: Secondary | ICD-10-CM | POA: Diagnosis not present

## 2022-11-23 DIAGNOSIS — R1084 Generalized abdominal pain: Secondary | ICD-10-CM | POA: Diagnosis not present

## 2022-11-23 DIAGNOSIS — R768 Other specified abnormal immunological findings in serum: Secondary | ICD-10-CM | POA: Diagnosis not present

## 2022-11-23 DIAGNOSIS — R7303 Prediabetes: Secondary | ICD-10-CM | POA: Diagnosis not present

## 2022-11-23 DIAGNOSIS — I10 Essential (primary) hypertension: Secondary | ICD-10-CM | POA: Diagnosis not present

## 2022-11-23 DIAGNOSIS — G8929 Other chronic pain: Secondary | ICD-10-CM | POA: Diagnosis not present

## 2022-11-23 DIAGNOSIS — Z9181 History of falling: Secondary | ICD-10-CM | POA: Diagnosis not present

## 2022-11-23 DIAGNOSIS — E559 Vitamin D deficiency, unspecified: Secondary | ICD-10-CM | POA: Diagnosis not present

## 2022-11-25 DIAGNOSIS — Z79899 Other long term (current) drug therapy: Secondary | ICD-10-CM | POA: Diagnosis not present

## 2022-12-14 ENCOUNTER — Encounter: Payer: Self-pay | Admitting: Internal Medicine

## 2022-12-14 ENCOUNTER — Ambulatory Visit (INDEPENDENT_AMBULATORY_CARE_PROVIDER_SITE_OTHER): Payer: Medicare HMO | Admitting: Internal Medicine

## 2022-12-14 VITALS — BP 130/70 | HR 113 | Ht 67.0 in | Wt 138.0 lb

## 2022-12-14 DIAGNOSIS — Z8619 Personal history of other infectious and parasitic diseases: Secondary | ICD-10-CM | POA: Diagnosis not present

## 2022-12-14 DIAGNOSIS — R634 Abnormal weight loss: Secondary | ICD-10-CM | POA: Diagnosis not present

## 2022-12-14 DIAGNOSIS — K5909 Other constipation: Secondary | ICD-10-CM | POA: Diagnosis not present

## 2022-12-14 NOTE — Progress Notes (Signed)
Bryan Powell 72 y.o. 22-May-1951 [id number]  Assessment & Plan:   Encounter Diagnoses  Name Primary?   Chronic constipation Yes   Unintentional weight loss - resolved    History of hepatitis C - treated and eradicated     Continue current therapy.  He seems stable.  Hepatitis C antibody is related to previous infection which has been eradicated and will always be positive and should not be tested.  LFTs normal, there is no reason to suspect recurrent infection.  Return in 1 year or sooner as needed   Subjective:   Chief Complaint: Constipation  HPI 72 year old African-American man with chronic constipation and history of transient unintentional weight loss related to situational stress, I think who presents for follow-up.  There is a history of substance abuse as well as hepatitis C which was eradicated years ago.  RNA confirmation of that in the chart.  His pain clinic can do labs recently and his hepatitis C antibody was positive.  LFTs remain normal.  He reports he moves his bowels 3-4 times a week using MiraLAX and/or Dulcolax as needed.  I had prescribed lubiprostone 24 mcg at last visit but he could not afford it.  No other new problems.  Stress level is reportedly better. Wt Readings from Last 3 Encounters:  12/14/22 138 lb (62.6 kg)  07/29/22 149 lb (67.6 kg)  05/19/22 140 lb (63.5 kg)    No Known Allergies Current Meds  Medication Sig   ACCU-CHEK GUIDE test strip USE DAILY TO CHECK BLOOD SUGAR LEVEL. R73. 09   Accu-Chek Softclix Lancets lancets SMARTSIG:Topical   ammonium lactate (LAC-HYDRIN) 12 % lotion Apply 1 Application topically 2 (two) times daily.   bismuth subsalicylate (PEPTO BISMOL) 262 MG/15ML suspension Take 30 mLs by mouth every 6 (six) hours as needed for indigestion.   Blood Glucose Monitoring Suppl (ACCU-CHEK GUIDE ME) w/Device KIT USE DAILY TO CHECK BLOOD SUGAR LEVEL.   cetirizine (ZYRTEC) 10 MG tablet Take 10 mg by mouth daily.   ciclopirox  (PENLAC) 8 % solution Apply 1 Application topically See admin instructions. Five time week   Cyanocobalamin (VITAMIN B-12 PO) Take 3,000 mcg by mouth daily.   cyclobenzaprine (FLEXERIL) 10 MG tablet Take 10 mg by mouth 2 (two) times a week.   esomeprazole (NEXIUM) 40 MG capsule Take 1 capsule (40 mg total) by mouth daily before breakfast.   feeding supplement, ENSURE ENLIVE, (ENSURE ENLIVE) LIQD Take 237 mLs by mouth 2 (two) times daily between meals. (Patient taking differently: Take 237 mLs by mouth 4 (four) times a week.)   Ferrous Sulfate (IRON PO) Take 27 mg by mouth daily at 6 (six) AM.   folic acid (FOLVITE) 1 MG tablet Take 1 tablet (1 mg total) by mouth daily. (Patient taking differently: Take 1 mg by mouth 2 (two) times a week.)   gabapentin (NEURONTIN) 800 MG tablet Take 800 mg by mouth 2 (two) times a week.   loratadine (CLARITIN) 10 MG tablet Take 10 mg by mouth daily.   LORazepam (ATIVAN) 0.5 MG tablet Take by mouth.   Magnesium 250 MG TABS Take 250 mg by mouth 2 (two) times a week.   meloxicam (MOBIC) 7.5 MG tablet Take 7.5 mg by mouth 2 (two) times a week.   Multiple Vitamin (MULTIVITAMIN WITH MINERALS) TABS tablet Take 1 tablet by mouth daily. For low vitamin (Patient taking differently: Take 1 tablet by mouth 2 (two) times a week. For low vitamin)   Multiple Vitamins-Minerals (ZINC PO)  Take 1 tablet by mouth daily at 6 (six) AM.   Oxycodone HCl 10 MG TABS Take 1 tablet by mouth 6 (six) times daily.   Probiotic Product (PROBIOTIC DAILY PO) Take 1 tablet by mouth daily at 6 (six) AM.   tiZANidine (ZANAFLEX) 4 MG tablet Take 4 mg by mouth once a week.   traZODone (DESYREL) 150 MG tablet Take 150 mg by mouth 2 (two) times a week. At bedtime   valACYclovir (VALTREX) 500 MG tablet Take 500 mg by mouth as needed (cold sores).   [DISCONTINUED] clonazePAM (KLONOPIN) 1 MG disintegrating tablet 1 to 2 tablets for anxiety prior to procedures   [DISCONTINUED] doxycycline (VIBRA-TABS) 100 MG  tablet 1 tablet Orally Twice a day for 10 days   Past Medical History:  Diagnosis Date   Alcohol abuse    Anxiety    Depression    ED (erectile dysfunction)    Genital herpes    GERD (gastroesophageal reflux disease)    Hep C w/o coma, chronic (HCC)    Heroin abuse (HCC)    Hypertension    Lower extremity venous stasis    Poor venous access    hx. of Heroin, Alcohol abuse "extreme difficult vein access"   Reflux    Past Surgical History:  Procedure Laterality Date   CHOLECYSTECTOMY     COLONOSCOPY WITH PROPOFOL N/A 04/05/2022   Procedure: COLONOSCOPY WITH PROPOFOL;  Surgeon: Iva Boop, MD;  Location: [facility] ENDOSCOPY;  Service: Gastroenterology;  Laterality: N/A;   I & D EXTREMITY  06/09/2012   Procedure: IRRIGATION AND DEBRIDEMENT EXTREMITY;  Surgeon: Sharma Covert, MD;  Location: [facility] OR;  Service: Orthopedics;  Laterality: Left;   LAPAROTOMY N/A 08/01/2016   Procedure: EXPLORATORY LAPAROTOMY;  Surgeon: Berna Bue, MD;  Location: [facility] OR;  Service: General;  Laterality: N/A;   ORBITAL FRACTURE SURGERY     ORIF TIBIA PLATEAU Right 11/14/2018   Procedure: OPEN REDUCTION INTERNAL FIXATION (ORIF) TIBIAL PLATEAU;  Surgeon: Roby Lofts, MD;  Location: [facility] OR;  Service: Orthopedics;  Laterality: Right;   SPLENECTOMY, TOTAL N/A 08/01/2016   Procedure: SPLENECTOMY;  Surgeon: Violeta Gelinas, MD;  [facility] OR;  Service: General;  Laterality: N/A;   TONSILLECTOMY     Social History   Social History Narrative   Married with children retired   Smoker of cigarettes former heroin user, occasional alcohol   family history includes Heart disease in his father; Stroke in his mother.   Review of Systems As per HPI  Objective:   Physical Exam BP 130/70   Pulse (!) 113   Ht 5\' 7"  (1.702 m)   Wt 138 lb (62.6 kg)   BMI 21.61 kg/m

## 2022-12-14 NOTE — Patient Instructions (Addendum)
Your hepatitis C antibody test will always be + You were treated for that and cleared.  Please continue MiraLax and Dulcolax for constipation.  I appreciate the opportunity to care for you. Iva Boop, MD, [facility]

## 2022-12-21 DIAGNOSIS — Z6820 Body mass index (BMI) 20.0-20.9, adult: Secondary | ICD-10-CM | POA: Diagnosis not present

## 2022-12-21 DIAGNOSIS — Z9181 History of falling: Secondary | ICD-10-CM | POA: Diagnosis not present

## 2022-12-21 DIAGNOSIS — Z79899 Other long term (current) drug therapy: Secondary | ICD-10-CM | POA: Diagnosis not present

## 2022-12-21 DIAGNOSIS — F17210 Nicotine dependence, cigarettes, uncomplicated: Secondary | ICD-10-CM | POA: Diagnosis not present

## 2022-12-21 DIAGNOSIS — Z87891 Personal history of nicotine dependence: Secondary | ICD-10-CM | POA: Diagnosis not present

## 2022-12-21 DIAGNOSIS — M5416 Radiculopathy, lumbar region: Secondary | ICD-10-CM | POA: Diagnosis not present

## 2022-12-23 DIAGNOSIS — L03115 Cellulitis of right lower limb: Secondary | ICD-10-CM | POA: Diagnosis not present

## 2022-12-23 DIAGNOSIS — L03116 Cellulitis of left lower limb: Secondary | ICD-10-CM | POA: Diagnosis not present

## 2022-12-23 DIAGNOSIS — L98492 Non-pressure chronic ulcer of skin of other sites with fat layer exposed: Secondary | ICD-10-CM | POA: Diagnosis not present

## 2022-12-26 DIAGNOSIS — Z79899 Other long term (current) drug therapy: Secondary | ICD-10-CM | POA: Diagnosis not present

## 2022-12-28 DIAGNOSIS — R634 Abnormal weight loss: Secondary | ICD-10-CM | POA: Diagnosis not present

## 2022-12-28 DIAGNOSIS — Z0000 Encounter for general adult medical examination without abnormal findings: Secondary | ICD-10-CM | POA: Diagnosis not present

## 2022-12-28 DIAGNOSIS — Z125 Encounter for screening for malignant neoplasm of prostate: Secondary | ICD-10-CM | POA: Diagnosis not present

## 2022-12-28 DIAGNOSIS — I700 Atherosclerosis of aorta: Secondary | ICD-10-CM | POA: Diagnosis not present

## 2022-12-28 DIAGNOSIS — R7309 Other abnormal glucose: Secondary | ICD-10-CM | POA: Diagnosis not present

## 2022-12-28 DIAGNOSIS — E7800 Pure hypercholesterolemia, unspecified: Secondary | ICD-10-CM | POA: Diagnosis not present

## 2023-01-06 ENCOUNTER — Ambulatory Visit
Admission: RE | Admit: 2023-01-06 | Discharge: 2023-01-06 | Disposition: A | Discharge status: Home / Self Care | Payer: Medicare HMO | Source: Ambulatory Visit | Attending: Physician Assistant | Admitting: Physician Assistant

## 2023-01-06 VITALS — BP 149/78 | HR 93 | Temp 98.7°F | Resp 16

## 2023-01-06 DIAGNOSIS — J449 Chronic obstructive pulmonary disease, unspecified: Secondary | ICD-10-CM | POA: Diagnosis not present

## 2023-01-06 DIAGNOSIS — L089 Local infection of the skin and subcutaneous tissue, unspecified: Secondary | ICD-10-CM

## 2023-01-06 MED ORDER — CEFTRIAXONE SODIUM 1 G IJ SOLR
0.5000 g | Freq: Once | INTRAMUSCULAR | Status: AC
  Administered 2023-01-06: 0.5 g via INTRAMUSCULAR

## 2023-01-06 MED ORDER — DOXYCYCLINE HYCLATE 100 MG PO CAPS: 100.0000 mg | ORAL_CAPSULE | Freq: Two times a day (BID) | ORAL | 0 refills | Status: DC

## 2023-01-06 NOTE — ED Triage Notes (Signed)
Pt reports with two abscess on his buttocks x 2 days. Pt states the wound have been draining.  Pt reports a small knot between goal area.  Pt reports leg swelling and discoloration.
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
## 2023-01-06 NOTE — Discharge Instructions (Addendum)
Return if any problems.

## 2023-01-15 ENCOUNTER — Ambulatory Visit (HOSPITAL_COMMUNITY)
Admission: EM | Admit: 2023-01-15 | Discharge: 2023-01-15 | Disposition: A | Discharge status: Home / Self Care | Payer: Medicare HMO

## 2023-01-15 ENCOUNTER — Encounter (HOSPITAL_COMMUNITY): Payer: Self-pay

## 2023-01-15 DIAGNOSIS — L0231 Cutaneous abscess of buttock: Secondary | ICD-10-CM

## 2023-01-15 NOTE — Discharge Instructions (Addendum)
Please to the nearest ER for further evaluation.

## 2023-01-15 NOTE — ED Triage Notes (Signed)
Abscess on the buttocks for 10 days. Patient has a history of cellulitis. Has been draining.

## 2023-01-15 NOTE — ED Provider Notes (Signed)
[facility]    CSN: [id number] Arrival date & time: 01/15/23  1724      History   Chief Complaint Chief Complaint  Patient presents with   Abscess    HPI Bryan Powell is a 72 y.o. male.   Patient presents to urgent care for evaluation of open wound of the left buttock and concern for infection to the area that he first noticed 10 days ago. States the area is draining pus and is warm to touch. Denies tenderness to the buttocks, swelling, and recent antibiotic/steroid use. On chart review, it appears patient was seen for this wound in the     Abscess   Past Medical History:  Diagnosis Date   Alcohol abuse    Anxiety    Depression    ED (erectile dysfunction)    Genital herpes    GERD (gastroesophageal reflux disease)    Hep C w/o coma, chronic (HCC)    Heroin abuse (HCC)    Hypertension    Lower extremity venous stasis    Poor venous access    hx. of Heroin, Alcohol abuse "extreme difficult vein access"   Reflux     Patient Active Problem List   Diagnosis Date Noted   History of hepatitis C - treated and eradicated 12/14/2022   Chronic constipation 12/14/2022   Positive colorectal cancer screening using Cologuard test 04/05/2022   Asplenia after surgical procedure 12/07/2021   Asplenia 12/07/2021   Chronic obstructive pulmonary disease, unspecified (HCC) 12/07/2021   Current smoker 12/07/2021   Hardening of the aorta (main artery of the heart) (HCC) 12/07/2021   History of vertebral fracture 12/07/2021   Insomnia 12/07/2021   Prediabetes 12/07/2021   Pure hypercholesterolemia 12/07/2021   Major depressive disorder, single episode, unspecified 12/07/2021   Recurrent major depression (HCC) 12/07/2021   Fracture of tibial shaft, right, closed 11/22/2018   Closed displaced fracture of right tibial spine 11/22/2018   Closed bicondylar fracture of right tibial plateau 11/14/2018   Polysubstance abuse (HCC) 11/14/2018   Closed fracture of upper end  of right fibula    Difficult intravenous access 03/07/2018   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   Bilateral lower leg cellulitis    Venous insufficiency of both lower extremities    Bilateral lower extremity edema    Varicose veins of lower extremities with ulcer (HCC)    Severe opioid use disorder (HCC) 10/05/2013   Abscess of left hip 10/04/2012   Finger infection 06/09/2012   Recurrent cellulitis of lower leg 10/12/2011   Lower extremity venous stasis    Reflux    ALLERGIC RHINITIS 11/02/2007   ERECTILE DYSFUNCTION 11/02/2007   GENITAL HERPES 05/23/2007   Chronic hepatitis C virus infection (HCC) 05/23/2007   ANXIETY 05/23/2007   HEROIN ABUSE 05/23/2007   GERD 05/23/2007   LOW BACK PAIN 05/23/2007    Past Surgical History:  Procedure Laterality Date   CHOLECYSTECTOMY     COLONOSCOPY WITH PROPOFOL N/A 04/05/2022   Procedure: COLONOSCOPY WITH PROPOFOL;  Surgeon: Iva Boop, MD;  Location: [facility] ENDOSCOPY;  Service: Gastroenterology;  Laterality: N/A;   I & D EXTREMITY  06/09/2012   Procedure: IRRIGATION AND DEBRIDEMENT EXTREMITY;  Surgeon: Sharma Covert, MD;  Location: [facility] OR;  Service: Orthopedics;  Laterality: Left;   LAPAROTOMY N/A 08/01/2016   Procedure: EXPLORATORY LAPAROTOMY;  Surgeon: Berna Bue, MD;  Location: [facility] OR;  Service: General;  Laterality: N/A;   ORBITAL FRACTURE SURGERY     ORIF TIBIA PLATEAU Right  11/14/2018   Procedure: OPEN REDUCTION INTERNAL FIXATION (ORIF) TIBIAL PLATEAU;  Surgeon: Roby Lofts, MD;  Location: [facility] OR;  Service: Orthopedics;  Laterality: Right;   SPLENECTOMY, TOTAL N/A 08/01/2016   Procedure: SPLENECTOMY;  Surgeon: Violeta Gelinas, MD;  Location: [facility] OR;  Service: General;  Laterality: N/A;   TONSILLECTOMY         Home Medications    Prior to Admission medications   Medication Sig Start Date End Date Taking? Authorizing Provider  bismuth subsalicylate (PEPTO BISMOL) 262 MG/15ML suspension Take 30 mLs by mouth every 6 (six) hours as  needed for indigestion.   Yes [provider]  cetirizine (ZYRTEC) 10 MG tablet Take 10 mg by mouth daily.   Yes [provider]  ciclopirox (PENLAC) 8 % solution Apply 1 Application topically See admin instructions. Five time week   Yes [provider]  Cyanocobalamin (VITAMIN B-12 PO) Take 3,000 mcg by mouth daily.   Yes [provider]  cyclobenzaprine (FLEXERIL) 10 MG tablet Take 10 mg by mouth 2 (two) times a week. 12/14/21  Yes [provider]  doxycycline (VIBRAMYCIN) 100 MG capsule Take 1 capsule (100 mg total) by mouth 2 (two) times daily. 01/06/23  Yes Cheron Schaumann K, PA-C  esomeprazole (NEXIUM) 40 MG capsule Take 1 capsule (40 mg total) by mouth daily before breakfast. 05/19/22  Yes Iva Boop, MD  feeding supplement, ENSURE ENLIVE, (ENSURE ENLIVE) LIQD Take 237 mLs by mouth 2 (two) times daily between meals. Patient taking differently: Take 237 mLs by mouth 4 (four) times a week. 11/16/18  Yes Kathlen Mody, MD  Ferrous Sulfate (IRON PO) Take 27 mg by mouth daily at 6 (six) AM.   Yes [provider]  folic acid (FOLVITE) 1 MG tablet Take 1 tablet (1 mg total) by mouth daily. Patient taking differently: Take 1 mg by mouth 2 (two) times a week. 11/17/18  Yes Kathlen Mody, MD  gabapentin (NEURONTIN) 800 MG tablet Take 800 mg by mouth 2 (two) times a week. 03/14/22  Yes [provider]  loratadine (CLARITIN) 10 MG tablet Take 10 mg by mouth daily. 10/31/14  Yes [provider]  LORazepam (ATIVAN) 0.5 MG tablet Take by mouth. 07/07/16  Yes [provider]  Magnesium 250 MG TABS Take 250 mg by mouth 2 (two) times a week.   Yes [provider]  meloxicam (MOBIC) 7.5 MG tablet Take 7.5 mg by mouth 2 (two) times a week. 03/14/22  Yes [provider]  Multiple Vitamin (MULTIVITAMIN WITH MINERALS) TABS tablet Take 1 tablet by mouth daily. For low vitamin Patient taking differently: Take 1 tablet by mouth  2 (two) times a week. For low vitamin 10/09/13  Yes Nwoko, Nicole Kindred I, NP  Multiple Vitamins-Minerals (ZINC PO) Take 1 tablet by mouth daily at 6 (six) AM.   Yes [provider]  Oxycodone HCl 10 MG TABS Take 1 tablet by mouth 6 (six) times daily. 05/14/19  Yes [provider]  Probiotic Product (PROBIOTIC DAILY PO) Take 1 tablet by mouth daily at 6 (six) AM.   Yes [provider]  tiZANidine (ZANAFLEX) 4 MG tablet Take 4 mg by mouth once a week. 02/11/22  Yes [provider]  traZODone (DESYREL) 150 MG tablet Take 150 mg by mouth 2 (two) times a week. At bedtime 07/24/17  Yes [provider]  valACYclovir (VALTREX) 500 MG tablet Take 500 mg by mouth as needed (cold sores). 03/11/22  Yes [provider]  ACCU-CHEK GUIDE test strip USE DAILY TO CHECK BLOOD SUGAR LEVEL. R73. 09 12/15/21   [provider]  Accu-Chek Softclix Lancets lancets SMARTSIG:Topical 12/15/21   [provider]  ammonium lactate (LAC-HYDRIN) 12 % lotion Apply 1 Application topically 2 (two) times daily.    [provider]  Blood Glucose Monitoring Suppl (ACCU-CHEK GUIDE ME) w/Device KIT USE DAILY TO CHECK BLOOD SUGAR LEVEL. 12/15/21   [provider]    Family History Family History  Problem Relation Age of Onset   Stroke Mother    Heart disease Father    Colon cancer Neg Hx    Stomach cancer Neg Hx    Esophageal cancer Neg Hx    Pancreatic cancer Neg Hx     Social History Social History   Tobacco Use   Smoking status: Every Day    Current packs/day: 0.00    Types: Cigarettes    Last attempt to quit: 04/15/2012    Years since quitting: 10.7   Smokeless tobacco: Never  Vaping Use   Vaping status: Never Used  Substance Use Topics   Alcohol use: Yes    Comment: wine about 5 days a week   Drug use: Not Currently    Types: Heroin     Allergies   Patient has no known allergies.   Review of Systems Review of Systems   Physical  Exam Triage Vital Signs ED Triage Vitals  Encounter Vitals Group     BP 01/15/23 1812 (!) 141/68     Systolic BP Percentile --      Diastolic BP Percentile --      Pulse Rate 01/15/23 1812 81     Resp 01/15/23 1812 18     Temp 01/15/23 1812 97.8 F (36.6 C)     Temp Source 01/15/23 1812 Oral     SpO2 01/15/23 1812 97 %     Weight 01/15/23 1812 138 lb 0.1 oz (62.6 kg)     Height 01/15/23 1812 5\' 7"  (1.702 m)     Head Circumference --      Peak Flow --      Pain Score 01/15/23 1811 8     Pain Loc --      Pain Education --      Exclude from Growth Chart --    No data found.  Updated Vital Signs BP (!) 141/68 (BP Location: Right Arm)   Pulse 81   Temp 97.8 F (36.6 C) (Oral)   Resp 18   Ht 5\' 7"  (1.702 m)   Wt 138 lb 0.1 oz (62.6 kg)   SpO2 97%   BMI 21.62 kg/m   Visual Acuity Right Eye Distance:   Left Eye Distance:   Bilateral Distance:    Right Eye Near:   Left Eye Near:    Bilateral Near:     Physical Exam   UC Treatments / Results  Labs (all labs ordered are listed, but only abnormal results are displayed) Labs Reviewed - No data to display  EKG   Radiology No results found.  Procedures Procedures (including critical care time)  Medications Ordered in UC Medications - No data to display  Initial Impression / Assessment and Plan / UC Course  I have reviewed the triage vital signs and the nursing notes.  Pertinent labs & imaging results that were available during my care of the patient were reviewed by me and considered in my medical decision making (see chart for details).     ***  Final Clinical Impressions(s) / UC Diagnoses   Final diagnoses:  Abscess of buttock, right  Abscess of left buttock     Discharge Instructions      Please to the nearest ER for further evaluation.     ED Prescriptions   None    PDMP not reviewed this encounter.

## 2023-01-15 NOTE — ED Notes (Signed)
Patient is being discharged from the Urgent Care and sent to the Emergency Department via POV . Per Juliet Rude NP, patient is in need of higher level of care due to an abscess. Patient is aware and verbalizes understanding of plan of care.  Vitals:   01/15/23 1812  BP: (!) 141/68  Pulse: 81  Resp: 18  Temp: 97.8 F (36.6 C)  SpO2: 97%

## 2023-03-11 ENCOUNTER — Other Ambulatory Visit: Payer: Self-pay

## 2023-03-11 ENCOUNTER — Emergency Department (HOSPITAL_COMMUNITY): Payer: Medicare HMO

## 2023-03-11 ENCOUNTER — Encounter (HOSPITAL_COMMUNITY): Payer: Self-pay | Admitting: Emergency Medicine

## 2023-03-11 ENCOUNTER — Emergency Department (HOSPITAL_COMMUNITY)
Admission: EM | Admit: 2023-03-11 | Discharge: 2023-03-11 | Disposition: A | Discharge status: Home / Self Care | Payer: Medicare HMO | Attending: Emergency Medicine | Admitting: Emergency Medicine

## 2023-03-11 DIAGNOSIS — R4182 Altered mental status, unspecified: Secondary | ICD-10-CM | POA: Diagnosis present

## 2023-03-11 DIAGNOSIS — R000 Tachycardia, unspecified: Secondary | ICD-10-CM | POA: Insufficient documentation

## 2023-03-11 LAB — CBG MONITORING, ED: Glucose-Capillary: 128 mg/dL — ABNORMAL HIGH (ref 70–99)

## 2023-03-11 NOTE — Discharge Instructions (Addendum)
You were seen today for altered mental status.  I am concerned for sepsis, life-threatening infection and I am worried that she will die without immediate care and management.  However you have refused to allow any care in the emergency room.  Please return as soon as you are able for ongoing care and management or check back in at any time.

## 2023-03-11 NOTE — ED Notes (Addendum)
Pt expresses desire to leave against medical advice. Pt signed electronic AMA form, Dr. Doran Durand notified. Dr. Doran Durand at bedside expressing risks of leaving without being treated. Pt still states understanding and continues to express desire to leave. Pt alert and orient to self, place, time and situation.
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
## 2023-03-11 NOTE — ED Notes (Signed)
Pt's orientation assessed. A&Ox4, verbally refusing treatment. Pt refused x-ray.

## 2023-03-11 NOTE — ED Provider Notes (Signed)
  [place] EMERGENCY DEPARTMENT AT [facility] Provider Note   CSN: [id number] Arrival date & time: 03/11/23  1636     History Chief Complaint  Patient presents with   Altered Mental Status    HPI Bryan Powell is a 72 y.o. male presenting for altered mental status.  Handoff from EMS as the patient was confused in his car had parked his car in front of somebody else's house. He was tachycardic and febrile and transfer per EMS. He denies any symptoms.  In fact as soon as I talk to the patient he states that he feels completely fine and does not want to waste his day in the emergency room.  He has declined any further care management and will not answer any further questions.  States that he is calling his wife to come pick him up  Review of Systems   Review of Systems Patient declined to answer Physical Exam Updated Vital Signs BP (!) 119/59 (BP Location: Left Arm)   Pulse (!) 115   Temp (!) 102.7 F (39.3 C) (Oral)   Resp (!) 22   Ht 5\' 7"  (1.702 m)   Wt 62.6 kg   SpO2 91%   BMI 21.62 kg/m  Physical Exam Patient refused to allow  ED Course/ Medical Decision Making/ A&P    Procedures Procedures   Medications Ordered in ED Medications - No data to display  Medical Decision Making:   71 year old male extensive medical history per chart.  I am concerned for infectious pathology and discussed this with the patient.  Possible viral respiratory symptoms given the relatively sudden onset of fever but he has refused to allow any examination.  He is A&O x 4 on my conversation.  He has correctly identified his name, the date and the sequence of events leading to the emergency room visit today. States he is upset that the EMS team took him out of his car and he feels that he just got overheated while in his car.  Informed patient that I am worried about sepsis or other immediately life-threatening pathology. He expressed understanding but stated he would not allow  any further workup as he "feels completely fine".  He stated that he "stays on top of my health" does not want any further medical investigation today.  Informed patient that he is welcome to return for further care and management especially given his fever and tachycardia and he expressed understanding of these findings.  He is calling his wife to pick him up and take him home. He expressed understanding of risk of death during my conversation but stated that he feels fine and does not wish to undergo any further evaluation.  Clinical Impression:  1. Altered mental status, unspecified altered mental status type      AMA   Final Clinical Impression(s) / ED Diagnoses Final diagnoses:  Altered mental status, unspecified altered mental status type    Rx / DC Orders ED Discharge Orders     None         Glyn Ade, MD 03/11/23 2248

## 2023-04-06 ENCOUNTER — Encounter (HOSPITAL_COMMUNITY): Payer: Self-pay

## 2023-04-06 ENCOUNTER — Ambulatory Visit (HOSPITAL_COMMUNITY)
Admission: EM | Admit: 2023-04-06 | Discharge: 2023-04-06 | Disposition: A | Discharge status: Home / Self Care | Payer: Medicare HMO | Attending: Internal Medicine | Admitting: Internal Medicine

## 2023-04-06 VITALS — BP 172/69 | HR 86 | Temp 97.3°F | Resp 16

## 2023-04-06 DIAGNOSIS — L0231 Cutaneous abscess of buttock: Secondary | ICD-10-CM | POA: Diagnosis not present

## 2023-04-06 MED ORDER — SULFAMETHOXAZOLE-TRIMETHOPRIM 800-160 MG PO TABS: 1.0000 | ORAL_TABLET | Freq: Two times a day (BID) | ORAL | 0 refills | Status: AC

## 2023-04-06 MED ORDER — SILVER SULFADIAZINE 1 % EX CREA: 1.0000 | TOPICAL_CREAM | Freq: Two times a day (BID) | CUTANEOUS | 0 refills | Status: DC

## 2023-04-06 NOTE — ED Provider Notes (Signed)
[facility]    CSN: [id number] Arrival date & time: 04/06/23  1051      History   Chief Complaint Chief Complaint  Patient presents with   Abscess    HPI Bryan Powell is a 72 y.o. male.   The history is provided by the patient.  Abscess Associated symptoms: no fever   Abscess right buttock 5 days ago actively draining.  Has had similar symptoms frequently in the past, his wife has been taking care of it.  Denies fever, chills, sweats.  Past Medical History:  Diagnosis Date   Alcohol abuse    Anxiety    Depression    ED (erectile dysfunction)    Genital herpes    GERD (gastroesophageal reflux disease)    Hep C w/o coma, chronic (HCC)    Heroin abuse (HCC)    Hypertension    Lower extremity venous stasis    Poor venous access    hx. of Heroin, Alcohol abuse "extreme difficult vein access"   Reflux     Patient Active Problem List   Diagnosis Date Noted   History of hepatitis C - treated and eradicated 12/14/2022   Chronic constipation 12/14/2022   Positive colorectal cancer screening using Cologuard test 04/05/2022   Asplenia after surgical procedure 12/07/2021   Asplenia 12/07/2021   Chronic obstructive pulmonary disease, unspecified (HCC) 12/07/2021   Current smoker 12/07/2021   Hardening of the aorta (main artery of the heart) (HCC) 12/07/2021   History of vertebral fracture 12/07/2021   Insomnia 12/07/2021   Prediabetes 12/07/2021   Pure hypercholesterolemia 12/07/2021   Major depressive disorder, single episode, unspecified 12/07/2021   Recurrent major depression (HCC) 12/07/2021   Fracture of tibial shaft, right, closed 11/22/2018   Closed displaced fracture of right tibial spine 11/22/2018   Closed bicondylar fracture of right tibial plateau 11/14/2018   Polysubstance abuse (HCC) 11/14/2018   Closed fracture of upper end of right fibula    Difficult intravenous access 03/07/2018   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   Bilateral lower leg  cellulitis    Venous insufficiency of both lower extremities    Bilateral lower extremity edema    Varicose veins of lower extremities with ulcer (HCC)    Severe opioid use disorder (HCC) 10/05/2013   Abscess of left hip 10/04/2012   Finger infection 06/09/2012   Recurrent cellulitis of lower leg 10/12/2011   Lower extremity venous stasis    Reflux    ALLERGIC RHINITIS 11/02/2007   ERECTILE DYSFUNCTION 11/02/2007   GENITAL HERPES 05/23/2007   Chronic hepatitis C virus infection (HCC) 05/23/2007   ANXIETY 05/23/2007   HEROIN ABUSE 05/23/2007   GERD 05/23/2007   LOW BACK PAIN 05/23/2007    Past Surgical History:  Procedure Laterality Date   CHOLECYSTECTOMY     COLONOSCOPY WITH PROPOFOL N/A 04/05/2022   Procedure: COLONOSCOPY WITH PROPOFOL;  Surgeon: Iva Boop, MD;  Location: [facility] ENDOSCOPY;  Service: Gastroenterology;  Laterality: N/A;   I & D EXTREMITY  06/09/2012   Procedure: IRRIGATION AND DEBRIDEMENT EXTREMITY;  Surgeon: Sharma Covert, MD;  Location: [facility] OR;  Service: Orthopedics;  Laterality: Left;   LAPAROTOMY N/A 08/01/2016   Procedure: EXPLORATORY LAPAROTOMY;  Surgeon: Berna Bue, MD;  Location: [facility] OR;  Service: General;  Laterality: N/A;   ORBITAL FRACTURE SURGERY     ORIF TIBIA PLATEAU Right 11/14/2018   Procedure: OPEN REDUCTION INTERNAL FIXATION (ORIF) TIBIAL PLATEAU;  Surgeon: Roby Lofts, MD;  Location: [facility] OR;  Service:  Orthopedics;  Laterality: Right;   SPLENECTOMY, TOTAL N/A 08/01/2016   Procedure: SPLENECTOMY;  Surgeon: Violeta Gelinas, MD;  Location: [facility] OR;  Service: General;  Laterality: N/A;   TONSILLECTOMY         Home Medications    Prior to Admission medications   Medication Sig Start Date End Date Taking? Authorizing Provider  silver sulfADIAZINE (SILVADENE) 1 % cream Apply 1 Application topically 2 (two) times daily. 04/06/23  Yes Meliton Rattan, PA  sulfamethoxazole-trimethoprim (BACTRIM DS) 800-160 MG tablet Take 1 tablet by mouth 2  (two) times daily for 7 days. 04/06/23 04/13/23 Yes Lamichael Youkhana, Marylene Land, PA  ACCU-CHEK GUIDE test strip USE DAILY TO CHECK BLOOD SUGAR LEVEL. R73. 09 12/15/21   [provider]  Accu-Chek Softclix Lancets lancets SMARTSIG:Topical 12/15/21   [provider]  bismuth subsalicylate (PEPTO BISMOL) 262 MG/15ML suspension Take 30 mLs by mouth every 6 (six) hours as needed for indigestion.    [provider]  Blood Glucose Monitoring Suppl (ACCU-CHEK GUIDE ME) w/Device KIT USE DAILY TO CHECK BLOOD SUGAR LEVEL. 12/15/21   [provider]  cetirizine (ZYRTEC) 10 MG tablet Take 10 mg by mouth daily.    [provider]  Cyanocobalamin (VITAMIN B-12 PO) Take 3,000 mcg by mouth daily.    [provider]  cyclobenzaprine (FLEXERIL) 10 MG tablet Take 10 mg by mouth 2 (two) times a week. 12/14/21   [provider]  esomeprazole (NEXIUM) 40 MG capsule Take 1 capsule (40 mg total) by mouth daily before breakfast. 05/19/22   Iva Boop, MD  feeding supplement, ENSURE ENLIVE, (ENSURE ENLIVE) LIQD Take 237 mLs by mouth 2 (two) times daily between meals. Patient taking differently: Take 237 mLs by mouth 4 (four) times a week. 11/16/18   Kathlen Mody, MD  Ferrous Sulfate (IRON PO) Take 27 mg by mouth daily at 6 (six) AM.    [provider]  folic acid (FOLVITE) 1 MG tablet Take 1 tablet (1 mg total) by mouth daily. Patient taking differently: Take 1 mg by mouth 2 (two) times a week. 11/17/18   Kathlen Mody, MD  gabapentin (NEURONTIN) 800 MG tablet Take 800 mg by mouth 2 (two) times a week. 03/14/22   [provider]  loratadine (CLARITIN) 10 MG tablet Take 10 mg by mouth daily. 10/31/14   [provider]  LORazepam (ATIVAN) 0.5 MG tablet Take by mouth. 07/07/16   [provider]  Magnesium 250 MG TABS Take 250 mg by mouth 2 (two) times a week.    [provider]  meloxicam (MOBIC) 7.5 MG tablet Take 7.5 mg by mouth 2 (two)  times a week. 03/14/22   [provider]  Multiple Vitamin (MULTIVITAMIN WITH MINERALS) TABS tablet Take 1 tablet by mouth daily. For low vitamin Patient taking differently: Take 1 tablet by mouth 2 (two) times a week. For low vitamin 10/09/13   Armandina Stammer I, NP  Multiple Vitamins-Minerals (ZINC PO) Take 1 tablet by mouth daily at 6 (six) AM.    [provider]  Probiotic Product (PROBIOTIC DAILY PO) Take 1 tablet by mouth daily at 6 (six) AM.    [provider]  tiZANidine (ZANAFLEX) 4 MG tablet Take 4 mg by mouth once a week. 02/11/22   [provider]  traZODone (DESYREL) 150 MG tablet Take 150 mg by mouth 2 (two) times a week. At bedtime 07/24/17   [provider]  valACYclovir (VALTREX) 500 MG tablet Take 500 mg by  mouth as needed (cold sores). 03/11/22   [provider]    Family History Family History  Problem Relation Age of Onset   Stroke Mother    Heart disease Father    Colon cancer Neg Hx    Stomach cancer Neg Hx    Esophageal cancer Neg Hx    Pancreatic cancer Neg Hx     Social History Social History   Tobacco Use   Smoking status: Every Day    Current packs/day: 0.50    Average packs/day: 0.5 packs/day for 0.8 years (0.4 ttl pk-yrs)    Types: Cigarettes    Start date: 2024    Last attempt to quit: 04/15/2012   Smokeless tobacco: Never  Vaping Use   Vaping status: Never Used  Substance Use Topics   Alcohol use: Yes    Comment: wine about 5 days a week   Drug use: Not Currently    Types: Heroin     Allergies   Patient has no known allergies.   Review of Systems Review of Systems  Constitutional:  Negative for fever.  Skin:  Positive for color change and wound.     Physical Exam Triage Vital Signs ED Triage Vitals  Encounter Vitals Group     BP 04/06/23 1143 (!) 172/69     Systolic BP Percentile --      Diastolic BP Percentile --      Pulse Rate 04/06/23 1143 86     Resp 04/06/23 1143 16     Temp  04/06/23 1143 (!) 97.3 F (36.3 C)     Temp Source 04/06/23 1143 Oral     SpO2 04/06/23 1143 96 %     Weight --      Height --      Head Circumference --      Peak Flow --      Pain Score 04/06/23 1145 8     Pain Loc --      Pain Education --      Exclude from Growth Chart --    No data found.  Updated Vital Signs BP (!) 172/69 (BP Location: Right Arm)   Pulse 86   Temp (!) 97.3 F (36.3 C) (Oral)   Resp 16   SpO2 96%   Visual Acuity Right Eye Distance:   Left Eye Distance:   Bilateral Distance:    Right Eye Near:   Left Eye Near:    Bilateral Near:     Physical Exam Vitals reviewed.  Constitutional:      Appearance: He is not ill-appearing.  Pulmonary:     Effort: Pulmonary effort is normal. No respiratory distress.  Skin:    Findings: Lesion present.     Comments: Bilateral buttocks are hyperpigmented with many scars and pitting in the skin, right buttock has 2 open areas, purulent drainage noted on bandage, local induration and warmth, mild erythema, no fluctuance      UC Treatments / Results  Labs (all labs ordered are listed, but only abnormal results are displayed) Labs Reviewed - No data to display  EKG   Radiology No results found.  Procedures Procedures (including critical care time)  Medications Ordered in UC Medications - No data to display  Initial Impression / Assessment and Plan / UC Course  I have reviewed the triage vital signs and the nursing notes.  Pertinent labs & imaging results that were available during my care of the patient were reviewed by me and considered in my  medical decision making (see chart for details).     72 year old male with recurrent abscesses to buttocks.  He has a history of back problems and sleeps sitting up.  Discussed with patient concern with breakdown due to chronic sitting recommend he use an inflatable donut cushion.  Will treat with antibiotics given increased heat to the area on right buttock, home  care and follow-up reviewed with patient.  Daily dressing changes, will Rx Silvadene Final Clinical Impressions(s) / UC Diagnoses   Final diagnoses:  Abscess of right buttock     Discharge Instructions      Use a donut cushion when sitting as discussed Clean and dry, apply Silvadene until the skin has healed   ED Prescriptions     Medication Sig Dispense Auth. Provider   sulfamethoxazole-trimethoprim (BACTRIM DS) 800-160 MG tablet Take 1 tablet by mouth 2 (two) times daily for 7 days. 14 tablet Meliton Rattan, [place]   silver sulfADIAZINE (SILVADENE) 1 % cream Apply 1 Application topically 2 (two) times daily. 50 g Meliton Rattan, [place]      PDMP not reviewed this encounter.   Meliton Rattan, [place] 04/06/23 1233

## 2023-04-06 NOTE — Discharge Instructions (Signed)
Use a donut cushion when sitting as discussed Clean and dry, apply Silvadene until the skin has healed

## 2023-04-06 NOTE — ED Triage Notes (Signed)
Patient states he has an abscess to the right buttocks and began 3-4 days ago.

## 2023-05-04 ENCOUNTER — Ambulatory Visit (HOSPITAL_COMMUNITY)
Admission: EM | Admit: 2023-05-04 | Discharge: 2023-05-04 | Disposition: A | Discharge status: Home / Self Care | Payer: Medicare HMO | Attending: Family Medicine | Admitting: Family Medicine

## 2023-05-04 ENCOUNTER — Encounter (HOSPITAL_COMMUNITY): Payer: Self-pay | Admitting: Family Medicine

## 2023-05-04 DIAGNOSIS — L304 Erythema intertrigo: Secondary | ICD-10-CM | POA: Diagnosis not present

## 2023-05-04 DIAGNOSIS — L0231 Cutaneous abscess of buttock: Secondary | ICD-10-CM | POA: Diagnosis not present

## 2023-05-04 MED ORDER — NYSTATIN 100000 UNIT/GM EX CREA: TOPICAL_CREAM | CUTANEOUS | 0 refills | Status: DC

## 2023-05-04 NOTE — ED Triage Notes (Signed)
Pt is here for recurrent abscess on his buttocks. Pt reports some drainage from the area.

## 2023-05-04 NOTE — Discharge Instructions (Addendum)
Your previous abscess is healing very well.  It is now getting granulation tissue over the wound and healing from the inside out. Continue to keep it clean with dry bandages changed out at least daily. Try to remain active to keep pressure off of the wound so it can heal. If you notice increased draining of purulence, pain, warmth, fever or chills return for further treatment

## 2023-05-04 NOTE — ED Provider Notes (Signed)
[facility]    CSN: [id number] Arrival date & time: 05/04/23  0272      History   Chief Complaint Chief Complaint  Patient presents with   Abscess    HPI Bryan Powell is a 72 y.o. male.   He is following up on a chronic right buttock wound.  This was initially an abscess that was   Abscess Associated symptoms: no fatigue and no fever     Past Medical History:  Diagnosis Date   Alcohol abuse    Anxiety    Depression    ED (erectile dysfunction)    Genital herpes    GERD (gastroesophageal reflux disease)    Hep C w/o coma, chronic (HCC)    Heroin abuse (HCC)    Hypertension    Lower extremity venous stasis    Poor venous access    hx. of Heroin, Alcohol abuse "extreme difficult vein access"   Reflux     Patient Active Problem List   Diagnosis Date Noted   History of hepatitis C - treated and eradicated 12/14/2022   Chronic constipation 12/14/2022   Positive colorectal cancer screening using Cologuard test 04/05/2022   Asplenia after surgical procedure 12/07/2021   Asplenia 12/07/2021   Chronic obstructive pulmonary disease, unspecified (HCC) 12/07/2021   Current smoker 12/07/2021   Hardening of the aorta (main artery of the heart) (HCC) 12/07/2021   History of vertebral fracture 12/07/2021   Insomnia 12/07/2021   Prediabetes 12/07/2021   Pure hypercholesterolemia 12/07/2021   Major depressive disorder, single episode, unspecified 12/07/2021   Recurrent major depression (HCC) 12/07/2021   Fracture of tibial shaft, right, closed 11/22/2018   Closed displaced fracture of right tibial spine 11/22/2018   Closed bicondylar fracture of right tibial plateau 11/14/2018   Polysubstance abuse (HCC) 11/14/2018   Closed fracture of upper end of right fibula    Difficult intravenous access 03/07/2018   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   Bilateral lower leg cellulitis    Venous insufficiency of both lower extremities    Bilateral lower extremity edema     Varicose veins of lower extremities with ulcer (HCC)    Severe opioid use disorder (HCC) 10/05/2013   Abscess of left hip 10/04/2012   Finger infection 06/09/2012   Recurrent cellulitis of lower leg 10/12/2011   Lower extremity venous stasis    Reflux    ALLERGIC RHINITIS 11/02/2007   ERECTILE DYSFUNCTION 11/02/2007   GENITAL HERPES 05/23/2007   Chronic hepatitis C virus infection (HCC) 05/23/2007   ANXIETY 05/23/2007   HEROIN ABUSE 05/23/2007   GERD 05/23/2007   LOW BACK PAIN 05/23/2007    Past Surgical History:  Procedure Laterality Date   CHOLECYSTECTOMY     COLONOSCOPY WITH PROPOFOL N/A 04/05/2022   Procedure: COLONOSCOPY WITH PROPOFOL;  Surgeon: Iva Boop, MD;  Location: [facility] ENDOSCOPY;  Service: Gastroenterology;  Laterality: N/A;   I & D EXTREMITY  06/09/2012   Procedure: IRRIGATION AND DEBRIDEMENT EXTREMITY;  Surgeon: Sharma Covert, MD;  Location: [facility] OR;  Service: Orthopedics;  Laterality: Left;   LAPAROTOMY N/A 08/01/2016   Procedure: EXPLORATORY LAPAROTOMY;  Surgeon: Berna Bue, MD;  Location: [facility] OR;  Service: General;  Laterality: N/A;   ORBITAL FRACTURE SURGERY     ORIF TIBIA PLATEAU Right 11/14/2018   Procedure: OPEN REDUCTION INTERNAL FIXATION (ORIF) TIBIAL PLATEAU;  Surgeon: Roby Lofts, MD;  Location: [facility] OR;  Service: Orthopedics;  Laterality: Right;   SPLENECTOMY, TOTAL N/A 08/01/2016   Procedure: SPLENECTOMY;  Surgeon: Violeta Gelinas, MD;  Location: [facility] OR;  Service: General;  Laterality: N/A;   TONSILLECTOMY         Home Medications    Prior to Admission medications   Medication Sig Start Date End Date Taking? Authorizing Provider  Accu-Chek Softclix Lancets lancets SMARTSIG:Topical 12/15/21  Yes [provider]  cetirizine (ZYRTEC) 10 MG tablet Take 10 mg by mouth daily.   Yes [provider]  Cyanocobalamin (VITAMIN B-12 PO) Take 3,000 mcg by mouth daily.   Yes [provider]  cyclobenzaprine (FLEXERIL) 10 MG tablet  Take 10 mg by mouth 2 (two) times a week. 12/14/21  Yes [provider]  esomeprazole (NEXIUM) 40 MG capsule Take 1 capsule (40 mg total) by mouth daily before breakfast. 05/19/22  Yes Iva Boop, MD  feeding supplement, ENSURE ENLIVE, (ENSURE ENLIVE) LIQD Take 237 mLs by mouth 2 (two) times daily between meals. Patient taking differently: Take 237 mLs by mouth 4 (four) times a week. 11/16/18  Yes Kathlen Mody, MD  Ferrous Sulfate (IRON PO) Take 27 mg by mouth daily at 6 (six) AM.   Yes [provider]  folic acid (FOLVITE) 1 MG tablet Take 1 tablet (1 mg total) by mouth daily. Patient taking differently: Take 1 mg by mouth 2 (two) times a week. 11/17/18  Yes Kathlen Mody, MD  gabapentin (NEURONTIN) 800 MG tablet Take 800 mg by mouth 2 (two) times a week. 03/14/22  Yes [provider]  loratadine (CLARITIN) 10 MG tablet Take 10 mg by mouth daily. 10/31/14  Yes [provider]  LORazepam (ATIVAN) 0.5 MG tablet Take by mouth. 07/07/16  Yes [provider]  Magnesium 250 MG TABS Take 250 mg by mouth 2 (two) times a week.   Yes [provider]  Multiple Vitamin (MULTIVITAMIN WITH MINERALS) TABS tablet Take 1 tablet by mouth daily. For low vitamin Patient taking differently: Take 1 tablet by mouth 2 (two) times a week. For low vitamin 10/09/13  Yes Nwoko, Nicole Kindred I, NP  Multiple Vitamins-Minerals (ZINC PO) Take 1 tablet by mouth daily at 6 (six) AM.   Yes [provider]  nystatin cream (MYCOSTATIN) Apply to affected area 2 times daily 05/04/23  Yes Ivor Messier, MD  silver sulfADIAZINE (SILVADENE) 1 % cream Apply 1 Application topically 2 (two) times daily. 04/06/23  Yes Meliton Rattan, PA  tiZANidine (ZANAFLEX) 4 MG tablet Take 4 mg by mouth once a week. 02/11/22  Yes [provider]  traZODone (DESYREL) 150 MG tablet Take 150 mg by mouth 2 (two) times a week. At bedtime 07/24/17  Yes [provider]  valACYclovir  (VALTREX) 500 MG tablet Take 500 mg by mouth as needed (cold sores). 03/11/22  Yes [provider]  ACCU-CHEK GUIDE test strip USE DAILY TO CHECK BLOOD SUGAR LEVEL. R73. 09 12/15/21   [provider]  bismuth subsalicylate (PEPTO BISMOL) 262 MG/15ML suspension Take 30 mLs by mouth every 6 (six) hours as needed for indigestion.    [provider]  Blood Glucose Monitoring Suppl (ACCU-CHEK GUIDE ME) w/Device KIT USE DAILY TO CHECK BLOOD SUGAR LEVEL. 12/15/21   [provider]  meloxicam (MOBIC) 7.5 MG tablet Take 7.5 mg by mouth 2 (two) times a week. 03/14/22   [provider]  Probiotic Product (PROBIOTIC DAILY PO) Take 1 tablet by mouth daily at 6 (six) AM.    [provider]    Family History Family History  Problem Relation Age  of Onset   Stroke Mother    Heart disease Father    Colon cancer Neg Hx    Stomach cancer Neg Hx    Esophageal cancer Neg Hx    Pancreatic cancer Neg Hx     Social History Social History   Tobacco Use   Smoking status: Every Day    Current packs/day: 0.50    Average packs/day: 0.5 packs/day for 0.9 years (0.4 ttl pk-yrs)    Types: Cigarettes    Start date: 2024    Last attempt to quit: 04/15/2012   Smokeless tobacco: Never  Vaping Use   Vaping status: Never Used  Substance Use Topics   Alcohol use: Yes    Comment: wine about 5 days a week   Drug use: Not Currently    Types: Heroin     Allergies   Patient has no known allergies.   Review of Systems Review of Systems  Constitutional:  Negative for appetite change, chills, fatigue, fever and unexpected weight change.  Musculoskeletal:  Negative for arthralgias, joint swelling and myalgias.  Skin:  Positive for color change and wound (chronic).  Neurological:  Negative for numbness.     Physical Exam Triage Vital Signs ED Triage Vitals [05/04/23 0815]  Encounter Vitals Group     BP 126/71     Systolic BP Percentile      Diastolic BP  Percentile      Pulse Rate 78     Resp 16     Temp 97.9 F (36.6 C)     Temp Source Oral     SpO2 97 %     Weight      Height      Head Circumference      Peak Flow      Pain Score      Pain Loc      Pain Education      Exclude from Growth Chart    No data found.  Updated Vital Signs BP 126/71 (BP Location: Left Arm)   Pulse 78   Temp 97.9 F (36.6 C) (Oral)   Resp 16   SpO2 97%   Visual Acuity Right Eye Distance:   Left Eye Distance:   Bilateral Distance:    Right Eye Near:   Left Eye Near:    Bilateral Near:     Physical Exam Vitals reviewed.  Constitutional:      General: He is not in acute distress.    Appearance: He is normal weight. He is not toxic-appearing.  Musculoskeletal:     Comments: Normal gait. Full range of motion at the hip.  Skin:    Comments: Inspection of the skin over the right inferior buttock reveals a well-healing wound with 2 openings.  The superior has good granulation tissue in the central area with out bleeding or purulent drainage.  The inferior hole has some coagulated blood with good granulation tissue underneath.  There is no purulent drainage, surrounding erythema, warmth, or fluctuance.  Neurological:     General: No focal deficit present.     Mental Status: He is alert.     Sensory: No sensory deficit.      UC Treatments / Results  Labs (all labs ordered are listed, but only abnormal results are displayed) Labs Reviewed - No data to display  EKG   Radiology No results found.  Procedures Procedures (including critical care time)  Medications Ordered in UC Medications - No data to display  Initial Impression /  Assessment and Plan / UC Course  I have reviewed the triage vital signs and the nursing notes.  Pertinent labs & imaging results that were available during my care of the patient were reviewed by me and considered in my medical decision making (see chart for details).     Well-healing abscess of the  right buttock - Inspection of the wound shows resolution of underlying infection with good granulation tissue of both drainage points.  It is healing well via second intent. - There is some serosanguineous drainage on the bandage but no signs of purulence - The wound was cleaned with a dry sterile bandage applied. - We discussed continuation of good wound care and hygiene as well as avoidance of alcohol, illicit substances.  He can also place some padding surrounding the wound to offload it to assist with proper healing. - If he develops increased swelling, redness, warmth, pain, fever, or chills he can return for further evaluation. - The patient voiced understanding and agreement  Final Clinical Impressions(s) / UC Diagnoses   Final diagnoses:  Abscess of buttock, right  Intertrigo     Discharge Instructions      Your previous abscess is healing very well.  It is now getting granulation tissue over the wound and healing from the inside out. Continue to keep it clean with dry bandages changed out at least daily. Try to remain active to keep pressure off of the wound so it can heal. If you notice increased draining of purulence, pain, warmth, fever or chills return for further treatment      ED Prescriptions     Medication Sig Dispense Auth. Provider   nystatin cream (MYCOSTATIN) Apply to affected area 2 times daily 30 g Ivor Messier, MD      PDMP not reviewed this encounter.   Ivor Messier, MD 05/04/23 1235

## 2023-06-24 ENCOUNTER — Ambulatory Visit (HOSPITAL_COMMUNITY)
Admission: EM | Admit: 2023-06-24 | Discharge: 2023-06-24 | Disposition: A | Discharge status: Home / Self Care | Payer: Medicare HMO | Attending: Internal Medicine | Admitting: Internal Medicine

## 2023-06-24 ENCOUNTER — Other Ambulatory Visit: Payer: Self-pay

## 2023-06-24 ENCOUNTER — Encounter (HOSPITAL_COMMUNITY): Payer: Self-pay | Admitting: Emergency Medicine

## 2023-06-24 DIAGNOSIS — L732 Hidradenitis suppurativa: Secondary | ICD-10-CM | POA: Diagnosis not present

## 2023-06-24 DIAGNOSIS — A6001 Herpesviral infection of penis: Secondary | ICD-10-CM

## 2023-06-24 MED ORDER — OXYCODONE HCL 5 MG PO TABS: 5.0000 mg | ORAL_TABLET | Freq: Three times a day (TID) | ORAL | 0 refills | Status: AC | PRN

## 2023-06-24 MED ORDER — PREDNISONE 10 MG (21) PO TBPK: ORAL_TABLET | Freq: Every day | ORAL | 0 refills | Status: DC

## 2023-06-24 MED ORDER — DOXYCYCLINE HYCLATE 100 MG PO CAPS: 100.0000 mg | ORAL_CAPSULE | Freq: Two times a day (BID) | ORAL | 3 refills | Status: DC

## 2023-06-24 MED ORDER — SILVER SULFADIAZINE 1 % EX CREA: 1.0000 | TOPICAL_CREAM | Freq: Every day | CUTANEOUS | 1 refills | Status: AC

## 2023-06-24 MED ORDER — VALACYCLOVIR HCL 1 G PO TABS: 500.0000 mg | ORAL_TABLET | Freq: Two times a day (BID) | ORAL | 1 refills | Status: AC

## 2023-06-24 NOTE — Discharge Instructions (Addendum)
 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 We will start treatment with the following:  Doxycycline  100 mg Take twice daily for 14 days, then decrease to once daily. If you flare, then increase back to twice daily for 14 days. Prednisone  taper take 6 tabs by mouth daily for 2 days, then 5 tabs for 2 days, then 4 tabs for 2 days, then 3 tabs for 2 days, 2 tabs for 2 days, then 1 tab by mouth daily for 2 days Valtrex  500mg  twice daily for 14 days.  Call dermatology to schedule an appointment for evaluation of Hidradenitis.  Apply silvadene  to the area 2-3 times daily and leave open to air or cover with dry dressing until no longer draining. Hibiclens  soap, wash with this 2-3 times weekly. Don't do this daily.  Return to urgent care or PCP if symptoms worsen or fail to resolve.

## 2023-06-24 NOTE — ED Triage Notes (Signed)
 Says abscess to left buttocks ruptured today, one on right ruptured yesterday.  Patient does have a large wet area on left back of pants

## 2023-06-25 NOTE — ED Provider Notes (Signed)
 [facility]    CSN: [id number] Arrival date & time: 06/24/23  1740      History   Chief Complaint Chief Complaint  Patient presents with   Abscess    HPI Bryan Powell is a 73 y.o. male.   73 year old male who presents to urgent care with complaints of bilateral buttocks drainage.  This is an ongoing issue that he has had for several years.  He has been treated numerous times for buttocks abscesses.  He has had numerous antibiotics as well as I&D's.  He reports that both areas have opened and drained.  They are both very tender and it is difficult to sit.  He has never been evaluated by dermatology for hidradenitis.  He denies having any areas other than around both buttocks.  He also reports he would like a prescription for Valtrex  as he is currently having an outbreak.   Abscess Associated symptoms: no fever and no vomiting     Past Medical History:  Diagnosis Date   Alcohol  abuse    Anxiety    Depression    ED (erectile dysfunction)    Genital herpes    GERD (gastroesophageal reflux disease)    Hep C w/o coma, chronic (HCC)    Heroin abuse (HCC)    Hypertension    Lower extremity venous stasis    Poor venous access    hx. of Heroin, Alcohol  abuse extreme difficult vein access   Reflux     Patient Active Problem List   Diagnosis Date Noted   History of hepatitis C - treated and eradicated 12/14/2022   Chronic constipation 12/14/2022   Positive colorectal cancer screening using Cologuard test 04/05/2022   Asplenia after surgical procedure 12/07/2021   Asplenia 12/07/2021   Chronic obstructive pulmonary disease, unspecified (HCC) 12/07/2021   Current smoker 12/07/2021   Hardening of the aorta (main artery of the heart) (HCC) 12/07/2021   History of vertebral fracture 12/07/2021   Insomnia 12/07/2021   Prediabetes 12/07/2021   Pure hypercholesterolemia 12/07/2021   Major depressive disorder, single episode, unspecified 12/07/2021   Recurrent  major depression (HCC) 12/07/2021   Fracture of tibial shaft, right, closed 11/22/2018   Closed displaced fracture of right tibial spine 11/22/2018   Closed bicondylar fracture of right tibial plateau 11/14/2018   Polysubstance abuse (HCC) 11/14/2018   Closed fracture of upper end of right fibula    Difficult intravenous access 03/07/2018   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   Bilateral lower leg cellulitis    Venous insufficiency of both lower extremities    Bilateral lower extremity edema    Varicose veins of lower extremities with ulcer (HCC)    Severe opioid use disorder (HCC) 10/05/2013   Abscess of left hip 10/04/2012   Finger infection 06/09/2012   Recurrent cellulitis of lower leg 10/12/2011   Lower extremity venous stasis    Reflux    ALLERGIC RHINITIS 11/02/2007   ERECTILE DYSFUNCTION 11/02/2007   GENITAL HERPES 05/23/2007   Chronic hepatitis C virus infection (HCC) 05/23/2007   ANXIETY 05/23/2007   HEROIN ABUSE 05/23/2007   GERD 05/23/2007   LOW BACK PAIN 05/23/2007    Past Surgical History:  Procedure Laterality Date   CHOLECYSTECTOMY     COLONOSCOPY WITH PROPOFOL  N/A 04/05/2022   Procedure: COLONOSCOPY WITH PROPOFOL ;  Surgeon: Avram Lupita BRAVO, MD;  Location: [facility] ENDOSCOPY;  Service: Gastroenterology;  Laterality: N/A;   I & D EXTREMITY  06/09/2012   Procedure: IRRIGATION AND DEBRIDEMENT EXTREMITY;  Surgeon: Prentice  LELON Pagan, MD;  Location: [facility] OR;  Service: Orthopedics;  Laterality: Left;   LAPAROTOMY N/A 08/01/2016   Procedure: EXPLORATORY LAPAROTOMY;  Surgeon: Mitzie DELENA Freund, MD;  Location: [facility] OR;  Service: General;  Laterality: N/A;   ORBITAL FRACTURE SURGERY     ORIF TIBIA PLATEAU Right 11/14/2018   Procedure: OPEN REDUCTION INTERNAL FIXATION (ORIF) TIBIAL PLATEAU;  Surgeon: Kendal Franky SQUIBB, MD;  Location: [facility] OR;  Service: Orthopedics;  Laterality: Right;   SPLENECTOMY, TOTAL N/A 08/01/2016   Procedure: SPLENECTOMY;  Surgeon: Dann Hummer, MD;  Location: [facility] OR;  Service:  General;  Laterality: N/A;   TONSILLECTOMY         Home Medications    Prior to Admission medications   Medication Sig Start Date End Date Taking? Authorizing Provider  doxycycline  (VIBRAMYCIN ) 100 MG capsule Take 1 capsule (100 mg total) by mouth 2 (two) times daily. Take twice daily for 14 days, then decrease to once daily. If you flare, then increase back to twice daily for 14 days. 06/24/23  Yes Tiya Schrupp A, PA-C  oxyCODONE  (ROXICODONE ) 5 MG immediate release tablet Take 1 tablet (5 mg total) by mouth every 8 (eight) hours as needed for up to 3 days for severe pain (pain score 7-10). 06/24/23 06/27/23 Yes Awad Gladd A, PA-C  predniSONE  (STERAPRED UNI-PAK 21 TAB) 10 MG (21) TBPK tablet Take by mouth daily. Take 6 tabs by mouth daily for 2 days, then 5 tabs for 2 days, then 4 tabs for 2 days, then 3 tabs for 2 days, 2 tabs for 2 days, then 1 tab by mouth daily for 2 days 06/24/23  Yes Rimsha Trembley A, PA-C  silver  sulfADIAZINE  (SILVADENE ) 1 % cream Apply 1 Application topically daily. 06/24/23  Yes Arlyce Circle A, PA-C  valACYclovir  (VALTREX ) 1000 MG tablet Take 0.5 tablets (500 mg total) by mouth 2 (two) times daily for 28 days. 06/24/23 07/22/23 Yes Adeline Petitfrere A, PA-C  ACCU-CHEK GUIDE test strip USE DAILY TO CHECK BLOOD SUGAR LEVEL. R73. 09 12/15/21   [provider]  Accu-Chek Softclix Lancets lancets SMARTSIG:Topical 12/15/21   [provider]  bismuth  subsalicylate (PEPTO BISMOL) 262 MG/15ML suspension Take 30 mLs by mouth every 6 (six) hours as needed for indigestion.    [provider]  Blood Glucose Monitoring Suppl (ACCU-CHEK GUIDE ME) w/Device KIT USE DAILY TO CHECK BLOOD SUGAR LEVEL. 12/15/21   [provider]  cetirizine (ZYRTEC) 10 MG tablet Take 10 mg by mouth daily.    [provider]  Cyanocobalamin (VITAMIN B-12 PO) Take 3,000 mcg by mouth daily.    [provider]  cyclobenzaprine  (FLEXERIL ) 10 MG tablet Take  10 mg by mouth 2 (two) times a week. 12/14/21   [provider]  esomeprazole  (NEXIUM ) 40 MG capsule Take 1 capsule (40 mg total) by mouth daily before breakfast. 05/19/22   Avram Lupita BRAVO, MD  feeding supplement, ENSURE ENLIVE, (ENSURE ENLIVE) LIQD Take 237 mLs by mouth 2 (two) times daily between meals. Patient taking differently: Take 237 mLs by mouth 4 (four) times a week. 11/16/18   Akula, Vijaya, MD  Ferrous Sulfate (IRON  PO) Take 27 mg by mouth daily at 6 (six) AM.    [provider]  folic acid  (FOLVITE ) 1 MG tablet Take 1 tablet (1 mg total) by mouth daily. Patient taking differently: Take 1 mg by mouth 2 (two) times a week. 11/17/18   Akula, Vijaya, MD  gabapentin  (NEURONTIN ) 800 MG tablet Take 800  mg by mouth 2 (two) times a week. Patient not taking: Reported on 06/24/2023 03/14/22   [provider]  loratadine  (CLARITIN ) 10 MG tablet Take 10 mg by mouth daily. 10/31/14   [provider]  LORazepam  (ATIVAN ) 0.5 MG tablet Take by mouth. Patient not taking: Reported on 06/24/2023 07/07/16   [provider]  Magnesium  250 MG TABS Take 250 mg by mouth 2 (two) times a week.    [provider]  meloxicam (MOBIC) 7.5 MG tablet Take 7.5 mg by mouth 2 (two) times a week. 03/14/22   [provider]  Multiple Vitamin (MULTIVITAMIN WITH MINERALS) TABS tablet Take 1 tablet by mouth daily. For low vitamin Patient taking differently: Take 1 tablet by mouth 2 (two) times a week. For low vitamin 10/09/13   Collene Gouge I, NP  Multiple Vitamins-Minerals (ZINC  PO) Take 1 tablet by mouth daily at 6 (six) AM.    [provider]  nystatin  cream (MYCOSTATIN ) Apply to affected area 2 times daily 05/04/23   Janet Lonni BRAVO, MD  Probiotic Product (PROBIOTIC DAILY PO) Take 1 tablet by mouth daily at 6 (six) AM.    [provider]  traZODone  (DESYREL ) 150 MG tablet Take 150 mg by mouth 2 (two) times a week. At bedtime Patient not taking:  Reported on 06/24/2023 07/24/17   [provider]    Family History Family History  Problem Relation Age of Onset   Stroke Mother    Heart disease Father    Colon cancer Neg Hx    Stomach cancer Neg Hx    Esophageal cancer Neg Hx    Pancreatic cancer Neg Hx     Social History Social History   Tobacco Use   Smoking status: Some Days    Current packs/day: 0.50    Average packs/day: 0.5 packs/day for 1 year (0.5 ttl pk-yrs)    Types: Cigarettes    Start date: 2024    Last attempt to quit: 04/15/2012   Smokeless tobacco: Never  Vaping Use   Vaping status: Never Used  Substance Use Topics   Alcohol  use: Yes    Comment: wine about 5 days a week   Drug use: Not Currently    Types: Heroin     Allergies   Patient has no known allergies.   Review of Systems Review of Systems  Constitutional:  Negative for chills and fever.  HENT:  Negative for ear pain and sore throat.   Eyes:  Negative for pain and visual disturbance.  Respiratory:  Negative for cough and shortness of breath.   Cardiovascular:  Negative for chest pain and palpitations.  Gastrointestinal:  Negative for abdominal pain and vomiting.  Genitourinary:  Positive for genital sores. Negative for dysuria and hematuria.  Musculoskeletal:  Negative for arthralgias and back pain.  Skin:  Positive for color change and wound. Negative for rash.  Neurological:  Negative for seizures and syncope.  All other systems reviewed and are negative.    Physical Exam Triage Vital Signs ED Triage Vitals  Encounter Vitals Group     BP 06/24/23 1750 (!) 94/56     Systolic BP Percentile --      Diastolic BP Percentile --      Pulse Rate 06/24/23 1750 69     Resp 06/24/23 1750 16     Temp 06/24/23 1750 98.4 F (36.9 C)     Temp Source 06/24/23 1750 Oral     SpO2 06/24/23 1750 98 %  Weight --      Height --      Head Circumference --      Peak Flow --      Pain Score 06/24/23 1748 10     Pain Loc --       Pain Education --      Exclude from Growth Chart --    No data found.  Updated Vital Signs BP (!) 93/50 (BP Location: Left Arm)   Pulse 69   Temp 98.4 F (36.9 C) (Oral)   Resp 16   SpO2 98%   Visual Acuity Right Eye Distance:   Left Eye Distance:   Bilateral Distance:    Right Eye Near:   Left Eye Near:    Bilateral Near:     Physical Exam Vitals and nursing note reviewed.  Constitutional:      General: He is not in acute distress.    Appearance: He is well-developed.  HENT:     Head: Normocephalic and atraumatic.  Eyes:     Conjunctiva/sclera: Conjunctivae normal.  Cardiovascular:     Rate and Rhythm: Normal rate and regular rhythm.     Heart sounds: No murmur heard. Pulmonary:     Effort: Pulmonary effort is normal. No respiratory distress.     Breath sounds: Normal breath sounds.  Abdominal:     Palpations: Abdomen is soft.     Tenderness: There is no abdominal tenderness.  Musculoskeletal:        General: No swelling.     Cervical back: Neck supple.  Skin:    General: Skin is warm and dry.     Capillary Refill: Capillary refill takes less than 2 seconds.       Neurological:     Mental Status: He is alert.  Psychiatric:        Mood and Affect: Mood normal.      UC Treatments / Results  Labs (all labs ordered are listed, but only abnormal results are displayed) Labs Reviewed - No data to display  EKG   Radiology No results found.  Procedures Procedures (including critical care time)  Medications Ordered in UC Medications - No data to display  Initial Impression / Assessment and Plan / UC Course  I have reviewed the triage vital signs and the nursing notes.  Pertinent labs & imaging results that were available during my care of the patient were reviewed by me and considered in my medical decision making (see chart for details).     Hidradenitis  Herpes simplex infection of penis   Hidradenitis of the buttocks bilateral. This is  actually a chronic skin and is normally treated by dermatology. We will start treatment with the following:  Doxycycline  100 mg Take twice daily for 14 days, then decrease to once daily. If you flare, then increase back to twice daily for 14 days. Prednisone  taper take 6 tabs by mouth daily for 2 days, then 5 tabs for 2 days, then 4 tabs for 2 days, then 3 tabs for 2 days, 2 tabs for 2 days, then 1 tab by mouth daily for 2 days Valtrex  500mg  twice daily for 14 days.  Call dermatology to schedule an appointment for evaluation of Hidradenitis.  Apply silvadene  to the area 2-3 times daily and leave open to air or cover with dry dressing until no longer draining. Hibiclens  soap, wash with this 2-3 times weekly. Don't do this daily.  Return to urgent care or PCP if symptoms worsen or fail  to resolve.    Final Clinical Impressions(s) / UC Diagnoses   Final diagnoses:  Hidradenitis     Discharge Instructions      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 We will start treatment with the following:  Doxycycline  100 mg Take twice daily for 14 days, then decrease to once daily. If you flare, then increase back to twice daily for 14 days. Prednisone  taper take 6 tabs by mouth daily for 2 days, then 5 tabs for 2 days, then 4 tabs for 2 days, then 3 tabs for 2 days, 2 tabs for 2 days, then 1 tab by mouth daily for 2 days Valtrex  500mg  twice daily for 14 days.  Call dermatology to schedule an appointment for evaluation of Hidradenitis.  Apply silvadene  to the area 2-3 times daily and leave open to air or cover with dry dressing until no longer draining. Hibiclens  soap, wash with this 2-3 times weekly. Don't do this daily.    ED Prescriptions     Medication Sig Dispense Auth. Provider   valACYclovir  (VALTREX ) 1000 MG tablet Take 0.5 tablets (500 mg total) by mouth 2 (two) times daily for 28 days. 14 tablet Vasil Juhasz A, PA-C    doxycycline  (VIBRAMYCIN ) 100 MG capsule Take 1 capsule (100 mg total) by mouth 2 (two) times daily. Take twice daily for 14 days, then decrease to once daily. If you flare, then increase back to twice daily for 14 days. 60 capsule Naasia Weilbacher A, PA-C   predniSONE  (STERAPRED UNI-PAK 21 TAB) 10 MG (21) TBPK tablet Take by mouth daily. Take 6 tabs by mouth daily for 2 days, then 5 tabs for 2 days, then 4 tabs for 2 days, then 3 tabs for 2 days, 2 tabs for 2 days, then 1 tab by mouth daily for 2 days 42 tablet Rae Sutcliffe A, PA-C   silver  sulfADIAZINE  (SILVADENE ) 1 % cream Apply 1 Application topically daily. 400 g Alter Moss A, PA-C   oxyCODONE  (ROXICODONE ) 5 MG immediate release tablet Take 1 tablet (5 mg total) by mouth every 8 (eight) hours as needed for up to 3 days for severe pain (pain score 7-10). 10 tablet Teresa Almarie LABOR, [place]      I have reviewed the PDMP during this encounter.   Teresa Almarie LABOR, [place] 06/26/23 1915

## 2024-02-02 ENCOUNTER — Encounter (HOSPITAL_COMMUNITY): Payer: Self-pay

## 2024-02-02 ENCOUNTER — Ambulatory Visit (HOSPITAL_COMMUNITY): Admission: EM | Admit: 2024-02-02 | Discharge: 2024-02-02 | Disposition: A | Discharge status: Home / Self Care

## 2024-02-02 DIAGNOSIS — L0231 Cutaneous abscess of buttock: Secondary | ICD-10-CM

## 2024-02-02 MED ORDER — OXYCODONE-ACETAMINOPHEN 5-325 MG PO TABS: 1.0000 | ORAL_TABLET | Freq: Four times a day (QID) | ORAL | 0 refills | Status: AC | PRN

## 2024-02-02 MED ORDER — SULFAMETHOXAZOLE-TRIMETHOPRIM 800-160 MG PO TABS: 1.0000 | ORAL_TABLET | Freq: Two times a day (BID) | ORAL | 0 refills | Status: AC

## 2024-02-02 NOTE — ED Provider Notes (Signed)
 [facility]    CSN: [id number] Arrival date & time: 02/02/24  1614      History   Chief Complaint Chief Complaint  Patient presents with   Abscess    HPI Bryan Powell is a 73 y.o. male.  Recurrent abscess on the left buttock  This one worsened in the last 3-4 days Very painful. Has not drained yet Hard to sit and walk No issues with BM  Denies fever or chills  He is going to follow with an HS clinic - info was given at last visit  Past Medical History:  Diagnosis Date   Alcohol  abuse    Anxiety    Depression    ED (erectile dysfunction)    Genital herpes    GERD (gastroesophageal reflux disease)    Hep C w/o coma, chronic (HCC)    Heroin abuse (HCC)    Hypertension    Lower extremity venous stasis    Poor venous access    hx. of Heroin, Alcohol  abuse extreme difficult vein access   Reflux     Patient Active Problem List   Diagnosis Date Noted   History of hepatitis C - treated and eradicated 12/14/2022   Chronic constipation 12/14/2022   Positive colorectal cancer screening using Cologuard test 04/05/2022   Asplenia after surgical procedure 12/07/2021   Asplenia 12/07/2021   Chronic obstructive pulmonary disease, unspecified (HCC) 12/07/2021   Current smoker 12/07/2021   Hardening of the aorta (main artery of the heart) (HCC) 12/07/2021   History of vertebral fracture 12/07/2021   Insomnia 12/07/2021   Prediabetes 12/07/2021   Pure hypercholesterolemia 12/07/2021   Major depressive disorder, single episode, unspecified 12/07/2021   Recurrent major depression (HCC) 12/07/2021   Fracture of tibial shaft, right, closed 11/22/2018   Closed displaced fracture of right tibial spine 11/22/2018   Closed bicondylar fracture of right tibial plateau 11/14/2018   Polysubstance abuse (HCC) 11/14/2018   Closed fracture of upper end of right fibula    Difficult intravenous access 03/07/2018   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   Bilateral lower leg  cellulitis    Venous insufficiency of both lower extremities    Bilateral lower extremity edema    Varicose veins of lower extremities with ulcer (HCC)    Severe opioid use disorder (HCC) 10/05/2013   Abscess of left hip 10/04/2012   Finger infection 06/09/2012   Recurrent cellulitis of lower leg 10/12/2011   Lower extremity venous stasis    Reflux    ALLERGIC RHINITIS 11/02/2007   ERECTILE DYSFUNCTION 11/02/2007   GENITAL HERPES 05/23/2007   Chronic hepatitis C virus infection (HCC) 05/23/2007   ANXIETY 05/23/2007   HEROIN ABUSE 05/23/2007   GERD 05/23/2007   LOW BACK PAIN 05/23/2007    Past Surgical History:  Procedure Laterality Date   CHOLECYSTECTOMY     COLONOSCOPY WITH PROPOFOL  N/A 04/05/2022   Procedure: COLONOSCOPY WITH PROPOFOL ;  Surgeon: Avram Lupita BRAVO, MD;  Location: [facility] ENDOSCOPY;  Service: Gastroenterology;  Laterality: N/A;   I & D EXTREMITY  06/09/2012   Procedure: IRRIGATION AND DEBRIDEMENT EXTREMITY;  Surgeon: Prentice LELON Pagan, MD;  Location: [facility];  Service: Orthopedics;  Laterality: Left;   LAPAROTOMY N/A 08/01/2016   Procedure: EXPLORATORY LAPAROTOMY;  Surgeon: Mitzie DELENA Freund, MD;  Location: [facility];  Service: General;  Laterality: N/A;   ORBITAL FRACTURE SURGERY     ORIF TIBIA PLATEAU Right 11/14/2018   Procedure: OPEN REDUCTION INTERNAL FIXATION (ORIF) TIBIAL PLATEAU;  Surgeon: Kendal Franky SQUIBB, MD;  Location: [facility];  Service: Orthopedics;  Laterality: Right;   SPLENECTOMY, TOTAL N/A 08/01/2016   Procedure: SPLENECTOMY;  Surgeon: Dann Hummer, MD;  Location: [facility];  Service: General;  Laterality: N/A;   TONSILLECTOMY         Home Medications    Prior to Admission medications   Medication Sig Start Date End Date Taking? Authorizing Provider  ciclopirox (PENLAC) 8 % solution Apply topically at bedtime. 01/26/24 04/25/24 Yes [provider]  oxyCODONE -acetaminophen  (PERCOCET/ROXICET) 5-325 MG tablet Take 1 tablet by mouth every 6 (six) hours as  needed for up to 2 doses for severe pain (pain score 7-10). 02/02/24  Yes Kazuma Elena, Asberry, PA-C  sulfamethoxazole -trimethoprim  (BACTRIM  DS) 800-160 MG tablet Take 1 tablet by mouth 2 (two) times daily for 10 days. 02/02/24 02/12/24 Yes Kaidin Boehle, Asberry, PA-C  tamsulosin (FLOMAX) 0.4 MG CAPS capsule Take 0.4 mg by mouth daily after breakfast. 01/03/24  Yes [provider]  ACCU-CHEK GUIDE test strip USE DAILY TO CHECK BLOOD SUGAR LEVEL. R73. 09 12/15/21   [provider]  Accu-Chek Softclix Lancets lancets SMARTSIG:Topical 12/15/21   [provider]  bismuth  subsalicylate (PEPTO BISMOL) 262 MG/15ML suspension Take 30 mLs by mouth every 6 (six) hours as needed for indigestion.    [provider]  Blood Glucose Monitoring Suppl (ACCU-CHEK GUIDE ME) w/Device KIT USE DAILY TO CHECK BLOOD SUGAR LEVEL. 12/15/21   [provider]  cetirizine (ZYRTEC) 10 MG tablet Take 10 mg by mouth daily.    [provider]  Cyanocobalamin (VITAMIN B-12 PO) Take 3,000 mcg by mouth daily.    [provider]  cyclobenzaprine  (FLEXERIL ) 10 MG tablet Take 10 mg by mouth 2 (two) times a week. 12/14/21   [provider]  esomeprazole  (NEXIUM ) 40 MG capsule Take 1 capsule (40 mg total) by mouth daily before breakfast. 05/19/22   Avram Lupita BRAVO, MD  feeding supplement, ENSURE ENLIVE, (ENSURE ENLIVE) LIQD Take 237 mLs by mouth 2 (two) times daily between meals. Patient taking differently: Take 237 mLs by mouth 4 (four) times a week. 11/16/18   Akula, Vijaya, MD  Ferrous Sulfate (IRON  PO) Take 27 mg by mouth daily at 6 (six) AM.    [provider]  gabapentin  (NEURONTIN ) 800 MG tablet Take 800 mg by mouth 2 (two) times a week. 03/14/22   [provider]  meloxicam (MOBIC) 7.5 MG tablet Take 7.5 mg by mouth 2 (two) times a week. 03/14/22   [provider]  Multiple Vitamins-Minerals (ZINC  PO) Take 1 tablet by mouth daily at 6 (six) AM.    [provider]  Probiotic Product (PROBIOTIC DAILY PO) Take 1 tablet by mouth daily at 6 (six) AM.    [provider]  silver  sulfADIAZINE  (SILVADENE ) 1 % cream Apply 1 Application topically daily. 06/24/23   White, Elizabeth A, PA-C  valACYclovir  (VALTREX ) 500 MG tablet Take 500 mg by mouth daily.    [provider]    Family History Family History  Problem Relation Age of Onset   Stroke Mother    Heart disease Father    Colon cancer Neg Hx    Stomach cancer Neg Hx    Esophageal cancer Neg Hx    Pancreatic cancer Neg Hx     Social History Social History   Tobacco Use   Smoking status: Some Days    Current packs/day: 0.50    Average packs/day: 0.5 packs/day for 1.6 years (0.8 ttl pk-yrs)  Types: Cigarettes    Start date: 2024    Last attempt to quit: 04/15/2012   Smokeless tobacco: Never  Vaping Use   Vaping status: Never Used  Substance Use Topics   Alcohol  use: Yes    Comment: wine about 5 days a week   Drug use: Not Currently    Types: Heroin     Allergies   Patient has no known allergies.   Review of Systems Review of Systems  As per HPI  Physical Exam Triage Vital Signs ED Triage Vitals  Encounter Vitals Group     BP 02/02/24 1710 (!) 156/76     Girls Systolic BP Percentile --      Girls Diastolic BP Percentile --      Boys Systolic BP Percentile --      Boys Diastolic BP Percentile --      Pulse Rate 02/02/24 1710 94     Resp 02/02/24 1710 16     Temp 02/02/24 1710 97.8 F (36.6 C)     Temp Source 02/02/24 1710 Oral     SpO2 02/02/24 1710 97 %     Weight --      Height --      Head Circumference --      Peak Flow --      Pain Score 02/02/24 1711 10     Pain Loc --      Pain Education --      Exclude from Growth Chart --    No data found.  Updated Vital Signs BP (!) 156/76 (BP Location: Right Arm)   Pulse 94   Temp 97.8 F (36.6 C) (Oral)   Resp 16   SpO2 97%   Physical Exam Vitals and nursing note reviewed.   Constitutional:      General: He is not in acute distress.    Appearance: Normal appearance.  Cardiovascular:     Rate and Rhythm: Normal rate and regular rhythm.     Heart sounds: Normal heart sounds.  Pulmonary:     Effort: Pulmonary effort is normal.     Breath sounds: Normal breath sounds.  Skin:    Findings: Abscess and erythema present.         Comments: Chaperone present for exam and procedure: Psychologist, sport and exercise. Large area of induration about 4 inches in diameter of the left glute. There is a central area of fluctuance about 1 inch diameter.  Neurological:     Mental Status: He is alert and oriented to person, place, and time.     UC Treatments / Results  Labs (all labs ordered are listed, but only abnormal results are displayed) Labs Reviewed - No data to display  EKG  Radiology No results found.  Procedures Incision and Drainage  Date/Time: 02/02/2024 7:00 PM  Performed by: Jeryl Stabs, PA-C Authorized by: Jeryl Stabs, PA-C   Consent:    Consent obtained:  Verbal   Consent given by:  Patient   Risks, benefits, and alternatives were discussed: yes     Risks discussed:  Bleeding, incomplete drainage, pain and infection Universal protocol:    Procedure explained and questions answered to patient or proxy's satisfaction: yes     Patient identity confirmed:  Verbally with patient Location:    Type:  Abscess   Size:  4 inches induration, 1 inch fluctuance   Location:  Anogenital   Anogenital location: buttocks. Pre-procedure details:    Skin preparation:  Antiseptic wash and chlorhexidine  Anesthesia:  Anesthesia method:  Topical application   Topical anesthetic:  LET Procedure type:    Complexity:  Simple Procedure details:    Incision types:  Stab incision   Drainage:  Bloody   Drainage amount:  Scant Post-procedure details:    Procedure completion:  Tolerated well, no immediate complications   Medications Ordered in UC Medications - No data to  display  Initial Impression / Assessment and Plan / UC Course  I have reviewed the triage vital signs and the nursing notes.  Pertinent labs & imaging results that were available during my care of the patient were reviewed by me and considered in my medical decision making (see chart for details).  Attempted to drain fluctuant portion of abscess without success. See procedure note  Patient requesting oxycodone . On chart review, history of polysubstance abuse, opioid use disorder PDMP reviewed. Last opioid prescription was 7 months ago. Given size of abscess and location, I have sent 2 tablets of percocet to use for severe pain. Strict precautions are given with this medication. Have advised no further narcotics will be prescribed from this clinic. Recommend warm soaks, warm compress, and frequent cleansing of the area. Good kidney function on chart review. He does not want doxycyline. Bactrim  sent, BID x 10 days.  Reports he will be following up with a specialist.  Return precautions   Final Clinical Impressions(s) / UC Diagnoses   Final diagnoses:  Abscess of left buttock     Discharge Instructions      Bactrim  -- antibiotic twice daily for 10 days in a row. Take with food to avoid upset stomach. Finish ALL the pills.  Warm soaks, warm compress, and keeping area clean (soap and water)  I have sent you two tablets of Percocet for severe pain relief. Please note this is a narcotic medication. It has addictive properties and should be used sparingly. It will make you drowsy. Do not drink, drive, or make judgement decisions if you choose to take this medication. You will not be given any refills of this medication.      ED Prescriptions     Medication Sig Dispense Auth. Provider   oxyCODONE -acetaminophen  (PERCOCET/ROXICET) 5-325 MG tablet Take 1 tablet by mouth every 6 (six) hours as needed for up to 2 doses for severe pain (pain score 7-10). 2 tablet Silus Lanzo, PA-C    sulfamethoxazole -trimethoprim  (BACTRIM  DS) 800-160 MG tablet Take 1 tablet by mouth 2 (two) times daily for 10 days. 20 tablet Toure Edmonds, Asberry, PA-C      I have reviewed the PDMP during this encounter.   Jeryl Asberry, [place] 02/02/24 [id number]

## 2024-02-02 NOTE — ED Triage Notes (Signed)
 Patient here today with c/o an abscess on the left buttock X 3-4 days.

## 2024-02-02 NOTE — Discharge Instructions (Signed)
 Bactrim  -- antibiotic twice daily for 10 days in a row. Take with food to avoid upset stomach. Finish ALL the pills.  Warm soaks, warm compress, and keeping area clean (soap and water)  I have sent you two tablets of Percocet for severe pain relief. Please note this is a narcotic medication. It has addictive properties and should be used sparingly. It will make you drowsy. Do not drink, drive, or make judgement decisions if you choose to take this medication. You will not be given any refills of this medication.

## 2024-04-24 ENCOUNTER — Other Ambulatory Visit: Payer: Self-pay

## 2024-04-24 ENCOUNTER — Encounter (HOSPITAL_COMMUNITY): Payer: Self-pay | Admitting: Emergency Medicine

## 2024-04-24 ENCOUNTER — Ambulatory Visit (HOSPITAL_COMMUNITY): Admission: EM | Admit: 2024-04-24 | Discharge: 2024-04-24 | Discharge status: Against Medical Advice

## 2024-04-24 DIAGNOSIS — L02416 Cutaneous abscess of left lower limb: Secondary | ICD-10-CM

## 2024-04-24 DIAGNOSIS — L03119 Cellulitis of unspecified part of limb: Secondary | ICD-10-CM

## 2024-04-24 NOTE — ED Provider Notes (Addendum)
 [facility]    CSN: [id number] Arrival date & time: 04/24/24  1840      History   Chief Complaint Chief Complaint  Patient presents with   Abscess    HPI Bryan Powell is a 73 y.o. male.   Bryan Powell is a 73 y.o. male presenting for chief complaint of Abscess to the left buttock/upper thigh with surrounding redness and pain that started 2-3 days ago. Denies recent trauma/injuries to the area. He has noticed scant purulent drainage from the wound. The redness has spread significantly in the last 24-48 hours. Denies fever/chills, nausea, vomiting, body aches, and dizziness. History of heroin/alcohol  abuse, denies recent use. Prediabetic. Denies recent antibiotics.      Past Medical History:  Diagnosis Date   Alcohol  abuse    Anxiety    Depression    ED (erectile dysfunction)    Genital herpes    GERD (gastroesophageal reflux disease)    Hep C w/o coma, chronic (HCC)    Heroin abuse (HCC)    Hypertension    Lower extremity venous stasis    Poor venous access    hx. of Heroin, Alcohol  abuse extreme difficult vein access   Reflux     Patient Active Problem List   Diagnosis Date Noted   History of hepatitis C - treated and eradicated 12/14/2022   Chronic constipation 12/14/2022   Positive colorectal cancer screening using Cologuard test 04/05/2022   Asplenia after surgical procedure 12/07/2021   Asplenia 12/07/2021   Chronic obstructive pulmonary disease, unspecified (HCC) 12/07/2021   Current smoker 12/07/2021   Hardening of the aorta (main artery of the heart) 12/07/2021   History of vertebral fracture 12/07/2021   Insomnia 12/07/2021   Prediabetes 12/07/2021   Pure hypercholesterolemia 12/07/2021   Major depressive disorder, single episode, unspecified 12/07/2021   Recurrent major depression 12/07/2021   Fracture of tibial shaft, right, closed 11/22/2018   Closed displaced fracture of right tibial spine 11/22/2018   Closed bicondylar  fracture of right tibial plateau 11/14/2018   Polysubstance abuse (HCC) 11/14/2018   Closed fracture of upper end of right fibula    Difficult intravenous access 03/07/2018   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   Bilateral lower leg cellulitis    Venous insufficiency of both lower extremities    Bilateral lower extremity edema    Varicose veins of lower extremities with ulcer (HCC)    Severe opioid use disorder (HCC) 10/05/2013   Abscess of left hip 10/04/2012   Finger infection 06/09/2012   Recurrent cellulitis of lower leg 10/12/2011   Lower extremity venous stasis    Reflux    ALLERGIC RHINITIS 11/02/2007   ERECTILE DYSFUNCTION 11/02/2007   GENITAL HERPES 05/23/2007   Chronic hepatitis C virus infection (HCC) 05/23/2007   ANXIETY 05/23/2007   HEROIN ABUSE 05/23/2007   GERD 05/23/2007   LOW BACK PAIN 05/23/2007    Past Surgical History:  Procedure Laterality Date   CHOLECYSTECTOMY     COLONOSCOPY WITH PROPOFOL  N/A 04/05/2022   Procedure: COLONOSCOPY WITH PROPOFOL ;  Surgeon: Avram Lupita BRAVO, MD;  Location: [facility] END[facility]COPY;  Service: Gastroenterology;  Laterality: N/A;   I & D EXTREMITY  06/09/2012   Procedure: IRRIGATION AND DEBRIDEMENT EXTREMITY;  Surgeon: Prentice LELON Pagan, MD;  Location: [facility];  Service: Orthopedics;  Laterality: Left;   LAPAROTOMY N/A 08/01/2016   Procedure: EXPLORATORY LAPAROTOMY;  Surgeon: Mitzie DELENA Freund, MD;  Location: [facility];  Service: General;  Laterality: N/A;   ORBITAL FRACTURE SURGERY  ORIF TIBIA PLATEAU Right 11/14/2018   Procedure: OPEN REDUCTION INTERNAL FIXATION (ORIF) TIBIAL PLATEAU;  Surgeon: Kendal Franky SQUIBB, MD;  Location: [facility];  Service: Orthopedics;  Laterality: Right;   SPLENECTOMY, TOTAL N/A 08/01/2016   Procedure: SPLENECTOMY;  Surgeon: Dann Hummer, MD;  Location: [facility];  Service: General;  Laterality: N/A;   TONSILLECTOMY         Home Medications    Prior to Admission medications   Medication Sig Start Date End Date Taking? Authorizing  Provider  ACCU-CHEK GUIDE test strip USE DAILY TO CHECK BLOOD SUGAR LEVEL. R73. 09 12/15/21   [provider]  Accu-Chek Softclix Lancets lancets SMARTSIG:Topical 12/15/21   [provider]  bismuth  subsalicylate (PEPTO BISMOL) 262 MG/15ML suspension Take 30 mLs by mouth every 6 (six) hours as needed for indigestion.    [provider]  Blood Glucose Monitoring Suppl (ACCU-CHEK GUIDE ME) w/Device KIT USE DAILY TO CHECK BLOOD SUGAR LEVEL. 12/15/21   [provider]  cetirizine (ZYRTEC) 10 MG tablet Take 10 mg by mouth daily.    [provider]  Cyanocobalamin (VITAMIN B-12 PO) Take 3,000 mcg by mouth daily.    [provider]  cyclobenzaprine  (FLEXERIL ) 10 MG tablet Take 10 mg by mouth 2 (two) times a week. 12/14/21   [provider]  esomeprazole  (NEXIUM ) 40 MG capsule Take 1 capsule (40 mg total) by mouth daily before breakfast. 05/19/22   Avram Lupita BRAVO, MD  feeding supplement, ENSURE ENLIVE, (ENSURE ENLIVE) LIQD Take 237 mLs by mouth 2 (two) times daily between meals. Patient taking differently: Take 237 mLs by mouth 4 (four) times a week. 11/16/18   Akula, Vijaya, MD  Ferrous Sulfate (IRON  PO) Take 27 mg by mouth daily at 6 (six) AM.    [provider]  gabapentin  (NEURONTIN ) 800 MG tablet Take 800 mg by mouth 2 (two) times a week. 03/14/22   [provider]  meloxicam (MOBIC) 7.5 MG tablet Take 7.5 mg by mouth 2 (two) times a week. 03/14/22   [provider]  Multiple Vitamins-Minerals (ZINC  PO) Take 1 tablet by mouth daily at 6 (six) AM.    [provider]  oxyCODONE -acetaminophen  (PERCOCET/ROXICET) 5-325 MG tablet Take 1 tablet by mouth every 6 (six) hours as needed for up to 2 doses for severe pain (pain score 7-10). 02/02/24   Rising, Asberry, PA-C  Probiotic Product (PROBIOTIC DAILY PO) Take 1 tablet by mouth daily at 6 (six) AM.    [provider]  silver  sulfADIAZINE  (SILVADENE ) 1 % cream Apply  1 Application topically daily. 06/24/23   Teresa Almarie LABOR, PA-C  tamsulosin (FLOMAX) 0.4 MG CAPS capsule Take 0.4 mg by mouth daily after breakfast. 01/03/24   [provider]  valACYclovir  (VALTREX ) 500 MG tablet Take 500 mg by mouth daily.    [provider]    Family History Family History  Problem Relation Age of Onset   Stroke Mother    Heart disease Father    Colon cancer Neg Hx    Stomach cancer Neg Hx    Esophageal cancer Neg Hx    Pancreatic cancer Neg Hx     Social History Social History   Tobacco Use   Smoking status: Some Days    Current packs/day: 0.50    Average packs/day: 0.5 packs/day for 1.9 years (0.9 ttl pk-yrs)    Types: Cigarettes    Start date: 2024    Last attempt to quit: 04/15/2012   Smokeless  tobacco: Never  Vaping Use   Vaping status: Never Used  Substance Use Topics   Alcohol  use: Yes    Comment: wine about 5 days a week   Drug use: Not Currently    Types: Heroin     Allergies   Patient has no known allergies.   Review of Systems Review of Systems Per HPI  Physical Exam Triage Vital Signs ED Triage Vitals  Encounter Vitals Group     BP 04/24/24 1954 131/74     Girls Systolic BP Percentile --      Girls Diastolic BP Percentile --      Boys Systolic BP Percentile --      Boys Diastolic BP Percentile --      Pulse Rate 04/24/24 1954 98     Resp 04/24/24 1954 (!) 22     Temp 04/24/24 1954 98.8 F (37.1 C)     Temp src --      SpO2 04/24/24 1954 95 %     Weight --      Height --      Head Circumference --      Peak Flow --      Pain Score 04/24/24 1950 10     Pain Loc --      Pain Education --      Exclude from Growth Chart --    No data found.  Updated Vital Signs BP 131/74 (BP Location: Right Arm)   Pulse 98   Temp 98.8 F (37.1 C)   Resp (!) 22   SpO2 95%   Visual Acuity Right Eye Distance:   Left Eye Distance:   Bilateral Distance:    Right Eye Near:   Left Eye Near:    Bilateral Near:      Physical Exam Vitals and nursing note reviewed.  Constitutional:      Appearance: He is not ill-appearing or toxic-appearing.  HENT:     Head: Normocephalic and atraumatic.     Right Ear: Hearing and external ear normal.     Left Ear: Hearing and external ear normal.     Nose: Nose normal.     Mouth/Throat:     Lips: Pink.  Eyes:     General: Lids are normal. Vision grossly intact. Gaze aligned appropriately.     Extraocular Movements: Extraocular movements intact.     Conjunctiva/sclera: Conjunctivae normal.  Pulmonary:     Effort: Pulmonary effort is normal.  Musculoskeletal:     Cervical back: Neck supple.  Skin:    General: Skin is warm and dry.     Capillary Refill: Capillary refill takes less than 2 seconds.     Findings: Abscess and erythema present. No rash.         Comments: Large fluctuant abscess to the posterior left upper thigh/left buttock with marked surrounding erythema and warmth as seen in images below. Erythema and swelling/warmth wraps around the lateral and medial parts of the left thigh and there is streaking redness into the left groin and scrotum. Very tender to palpation. Hot to touch.  +2 left popliteal pulse, sensation intact to distal LLE. Ambulatory with steady gait.   Neurological:     General: No focal deficit present.     Mental Status: He is alert and oriented to person, place, and time. Mental status is at baseline.     Cranial Nerves: No dysarthria or facial asymmetry.  Psychiatric:        Mood and Affect: Mood normal.  Speech: Speech normal.        Behavior: Behavior normal.        Thought Content: Thought content normal.        Judgment: Judgment normal.    Posterior left upper thigh/buttock   Posterior upper thigh/left buttock    UC Treatments / Results  Labs (all labs ordered are listed, but only abnormal results are displayed) Labs Reviewed - No data to display  EKG   Radiology No results  found.  Procedures Procedures (including critical care time)  Medications Ordered in UC Medications - No data to display  Initial Impression / Assessment and Plan / UC Course  I have reviewed the triage vital signs and the nursing notes.  Pertinent labs & imaging results that were available during my care of the patient were reviewed by me and considered in my medical decision making (see chart for details).   1. Cellulitis and abscess of leg, abscess of left thigh Marked swelling, erythema, soft tissue induration, and abscess to the left upper thigh.  Patient is at risk for severe infection, is ill-appearing, and would benefit from a more complete workup in the emergency department setting.  Patient would prefer to avoid ER visit, but we discussed the importance of going to the ER due to concerns for severe infection that may require IV antibiotics for adequate treatment. Discussed risks of deferring ER visit, patient verbalizes understanding. Vital signs are stable, he's safe for discharge to the ER by private car.   Final Clinical Impressions(s) / UC Diagnoses   Final diagnoses:  Abscess of left thigh  Cellulitis and abscess of leg     Discharge Instructions      Please go to the nearest ER for further workup due to concern for severe infection to the left thigh.     ED Prescriptions   None    PDMP not reviewed this encounter.   Enedelia Dorna HERO, FNP 04/30/24 2135    Enedelia Dorna HERO, FNP 04/30/24 2135

## 2024-04-24 NOTE — Discharge Instructions (Addendum)
 Please go to the nearest ER for further workup due to concern for severe infection to the left thigh.

## 2024-04-24 NOTE — ED Notes (Signed)
 Patient is being discharged from the Urgent Care and sent to the Emergency Department via pov . Per Rosaline, NP, patient is in need of higher level of care due to infection. Patient is aware and verbalizes understanding of plan of care.  Vitals:   04/24/24 1954  BP: 131/74  Pulse: 98  Resp: (!) 22  Temp: 98.8 F (37.1 C)  SpO2: 95%

## 2024-04-24 NOTE — ED Triage Notes (Signed)
 Patient complains of abscess.  Describes abscess to be on left buttocks/upper thigh.  This abscess noticed 2-3 days ago.  Patient reports slight drainage.  Has taken tylenol /motrin .  Patient has a history of abscess.
